# Patient Record
Sex: Male | Born: 1935 | Race: White | Hispanic: No | Smoking: Former smoker
Health system: Southern US, Community
[De-identification: ages and names within clinical notes are randomized; demographics above are authoritative.]

## PROBLEM LIST (undated history)

## (undated) DIAGNOSIS — N183 Chronic kidney disease, stage 3 unspecified: Secondary | ICD-10-CM

## (undated) DIAGNOSIS — L570 Actinic keratosis: Secondary | ICD-10-CM

## (undated) DIAGNOSIS — J209 Acute bronchitis, unspecified: Secondary | ICD-10-CM

## (undated) DIAGNOSIS — I639 Cerebral infarction, unspecified: Secondary | ICD-10-CM

## (undated) DIAGNOSIS — C61 Malignant neoplasm of prostate: Secondary | ICD-10-CM

## (undated) DIAGNOSIS — M545 Low back pain, unspecified: Secondary | ICD-10-CM

## (undated) DIAGNOSIS — J449 Chronic obstructive pulmonary disease, unspecified: Secondary | ICD-10-CM

## (undated) DIAGNOSIS — I10 Essential (primary) hypertension: Secondary | ICD-10-CM

## (undated) DIAGNOSIS — I739 Peripheral vascular disease, unspecified: Secondary | ICD-10-CM

## (undated) DIAGNOSIS — K219 Gastro-esophageal reflux disease without esophagitis: Secondary | ICD-10-CM

## (undated) DIAGNOSIS — Q2730 Arteriovenous malformation, site unspecified: Secondary | ICD-10-CM

## (undated) DIAGNOSIS — J309 Allergic rhinitis, unspecified: Secondary | ICD-10-CM

## (undated) DIAGNOSIS — R7309 Other abnormal glucose: Secondary | ICD-10-CM

## (undated) DIAGNOSIS — N400 Enlarged prostate without lower urinary tract symptoms: Secondary | ICD-10-CM

## (undated) DIAGNOSIS — M1990 Unspecified osteoarthritis, unspecified site: Secondary | ICD-10-CM

## (undated) DIAGNOSIS — D126 Benign neoplasm of colon, unspecified: Secondary | ICD-10-CM

## (undated) DIAGNOSIS — Z87891 Personal history of nicotine dependence: Secondary | ICD-10-CM

## (undated) DIAGNOSIS — M316 Other giant cell arteritis: Secondary | ICD-10-CM

## (undated) DIAGNOSIS — S5010XA Contusion of unspecified forearm, initial encounter: Secondary | ICD-10-CM

## (undated) DIAGNOSIS — Z8546 Personal history of malignant neoplasm of prostate: Secondary | ICD-10-CM

## (undated) DIAGNOSIS — K5900 Constipation, unspecified: Secondary | ICD-10-CM

## (undated) DIAGNOSIS — K627 Radiation proctitis: Secondary | ICD-10-CM

## (undated) DIAGNOSIS — R634 Abnormal weight loss: Secondary | ICD-10-CM

## (undated) DIAGNOSIS — G4700 Insomnia, unspecified: Secondary | ICD-10-CM

## (undated) DIAGNOSIS — M542 Cervicalgia: Secondary | ICD-10-CM

## (undated) DIAGNOSIS — K5790 Diverticulosis of intestine, part unspecified, without perforation or abscess without bleeding: Secondary | ICD-10-CM

## (undated) HISTORY — DX: Radiation proctitis: K62.7

## (undated) HISTORY — DX: Cerebral infarction, unspecified: I63.9

## (undated) HISTORY — DX: Benign neoplasm of colon, unspecified: D12.6

## (undated) HISTORY — DX: Low back pain: M54.5

## (undated) HISTORY — DX: Essential (primary) hypertension: I10

## (undated) HISTORY — DX: Malignant neoplasm of prostate: C61

## (undated) HISTORY — DX: Low back pain, unspecified: M54.50

## (undated) HISTORY — PX: INSERTION PROSTATE RADIATION SEED: SUR718

## (undated) HISTORY — DX: Gastro-esophageal reflux disease without esophagitis: K21.9

## (undated) HISTORY — DX: Unspecified osteoarthritis, unspecified site: M19.90

## (undated) HISTORY — DX: Abnormal weight loss: R63.4

## (undated) HISTORY — DX: Contusion of unspecified forearm, initial encounter: S50.10XA

## (undated) HISTORY — DX: Arteriovenous malformation, site unspecified: Q27.30

## (undated) HISTORY — DX: Personal history of malignant neoplasm of prostate: Z85.46

## (undated) HISTORY — DX: Other abnormal glucose: R73.09

## (undated) HISTORY — DX: Diverticulosis of intestine, part unspecified, without perforation or abscess without bleeding: K57.90

## (undated) HISTORY — DX: Cervicalgia: M54.2

## (undated) HISTORY — DX: Peripheral vascular disease, unspecified: I73.9

## (undated) HISTORY — DX: Other giant cell arteritis: M31.6

## (undated) HISTORY — DX: Acute bronchitis, unspecified: J20.9

## (undated) HISTORY — DX: Chronic obstructive pulmonary disease, unspecified: J44.9

## (undated) HISTORY — DX: Benign prostatic hyperplasia without lower urinary tract symptoms: N40.0

## (undated) HISTORY — DX: Insomnia, unspecified: G47.00

## (undated) HISTORY — PX: CATARACT EXTRACTION, BILATERAL: SHX1313

## (undated) HISTORY — DX: Actinic keratosis: L57.0

## (undated) HISTORY — DX: Allergic rhinitis, unspecified: J30.9

## (undated) HISTORY — DX: Constipation, unspecified: K59.00

## (undated) HISTORY — DX: Personal history of nicotine dependence: Z87.891

---

## 1997-02-06 ENCOUNTER — Encounter: Payer: Self-pay | Admitting: Internal Medicine

## 1997-12-12 ENCOUNTER — Ambulatory Visit (HOSPITAL_COMMUNITY): Admission: RE | Admit: 1997-12-12 | Discharge: 1997-12-12 | Payer: Self-pay | Admitting: Internal Medicine

## 1997-12-24 ENCOUNTER — Ambulatory Visit (HOSPITAL_COMMUNITY): Admission: RE | Admit: 1997-12-24 | Discharge: 1997-12-24 | Payer: Self-pay | Admitting: Internal Medicine

## 1998-06-01 DIAGNOSIS — Z8546 Personal history of malignant neoplasm of prostate: Secondary | ICD-10-CM

## 1998-06-01 HISTORY — DX: Personal history of malignant neoplasm of prostate: Z85.46

## 1998-08-14 ENCOUNTER — Ambulatory Visit (HOSPITAL_BASED_OUTPATIENT_CLINIC_OR_DEPARTMENT_OTHER): Admission: RE | Admit: 1998-08-14 | Discharge: 1998-08-14 | Payer: Self-pay | Admitting: Surgery

## 1999-05-05 ENCOUNTER — Other Ambulatory Visit: Admission: RE | Admit: 1999-05-05 | Discharge: 1999-05-05 | Payer: Self-pay | Admitting: Urology

## 1999-05-30 ENCOUNTER — Encounter: Admission: RE | Admit: 1999-05-30 | Discharge: 1999-08-28 | Payer: Self-pay | Admitting: Radiation Oncology

## 1999-07-29 ENCOUNTER — Ambulatory Visit (HOSPITAL_BASED_OUTPATIENT_CLINIC_OR_DEPARTMENT_OTHER): Admission: RE | Admit: 1999-07-29 | Discharge: 1999-07-29 | Payer: Self-pay | Admitting: Urology

## 1999-07-29 ENCOUNTER — Encounter: Payer: Self-pay | Admitting: Urology

## 1999-08-19 ENCOUNTER — Encounter: Payer: Self-pay | Admitting: Radiation Oncology

## 1999-09-05 ENCOUNTER — Encounter: Admission: RE | Admit: 1999-09-05 | Discharge: 1999-12-04 | Payer: Self-pay | Admitting: Radiation Oncology

## 1999-10-16 ENCOUNTER — Emergency Department (HOSPITAL_COMMUNITY): Admission: EM | Admit: 1999-10-16 | Discharge: 1999-10-16 | Payer: Self-pay | Admitting: Emergency Medicine

## 2001-08-30 ENCOUNTER — Encounter: Payer: Self-pay | Admitting: Gastroenterology

## 2001-08-30 DIAGNOSIS — D126 Benign neoplasm of colon, unspecified: Secondary | ICD-10-CM

## 2001-08-30 HISTORY — DX: Benign neoplasm of colon, unspecified: D12.6

## 2003-08-06 ENCOUNTER — Encounter: Payer: Self-pay | Admitting: Gastroenterology

## 2003-09-06 ENCOUNTER — Encounter: Payer: Self-pay | Admitting: Gastroenterology

## 2004-06-24 ENCOUNTER — Ambulatory Visit: Payer: Self-pay | Admitting: Internal Medicine

## 2004-09-22 ENCOUNTER — Ambulatory Visit: Payer: Self-pay | Admitting: Internal Medicine

## 2004-11-11 ENCOUNTER — Ambulatory Visit: Payer: Self-pay | Admitting: Internal Medicine

## 2004-11-24 ENCOUNTER — Ambulatory Visit: Payer: Self-pay | Admitting: Internal Medicine

## 2005-03-17 ENCOUNTER — Ambulatory Visit: Payer: Self-pay | Admitting: Internal Medicine

## 2005-06-30 ENCOUNTER — Ambulatory Visit: Payer: Self-pay | Admitting: Internal Medicine

## 2005-09-28 ENCOUNTER — Ambulatory Visit: Payer: Self-pay | Admitting: Internal Medicine

## 2006-01-18 ENCOUNTER — Ambulatory Visit: Payer: Self-pay | Admitting: Internal Medicine

## 2006-02-03 ENCOUNTER — Ambulatory Visit: Payer: Self-pay | Admitting: Internal Medicine

## 2006-02-08 ENCOUNTER — Ambulatory Visit: Payer: Self-pay | Admitting: *Deleted

## 2006-03-05 ENCOUNTER — Ambulatory Visit: Payer: Self-pay | Admitting: Internal Medicine

## 2006-04-19 ENCOUNTER — Ambulatory Visit: Payer: Self-pay | Admitting: Internal Medicine

## 2006-05-13 ENCOUNTER — Ambulatory Visit: Payer: Self-pay | Admitting: Internal Medicine

## 2006-07-20 ENCOUNTER — Ambulatory Visit: Payer: Self-pay | Admitting: Internal Medicine

## 2006-11-16 ENCOUNTER — Ambulatory Visit: Payer: Self-pay | Admitting: Internal Medicine

## 2006-11-16 LAB — CONVERTED CEMR LAB
AST: 23 units/L (ref 0–37)
BUN: 16 mg/dL (ref 6–23)
LDL Cholesterol: 72 mg/dL (ref 0–99)
Potassium: 4.7 meq/L (ref 3.5–5.1)
VLDL: 8 mg/dL (ref 0–40)

## 2006-11-24 ENCOUNTER — Ambulatory Visit: Payer: Self-pay | Admitting: Internal Medicine

## 2007-02-24 ENCOUNTER — Ambulatory Visit: Payer: Self-pay | Admitting: Internal Medicine

## 2007-02-25 ENCOUNTER — Ambulatory Visit: Payer: Self-pay | Admitting: Internal Medicine

## 2007-08-24 ENCOUNTER — Ambulatory Visit: Payer: Self-pay | Admitting: Internal Medicine

## 2007-08-24 DIAGNOSIS — M542 Cervicalgia: Secondary | ICD-10-CM

## 2007-08-24 DIAGNOSIS — M545 Low back pain, unspecified: Secondary | ICD-10-CM | POA: Insufficient documentation

## 2007-08-24 DIAGNOSIS — I10 Essential (primary) hypertension: Secondary | ICD-10-CM

## 2007-08-24 DIAGNOSIS — M1990 Unspecified osteoarthritis, unspecified site: Secondary | ICD-10-CM

## 2007-08-24 DIAGNOSIS — I739 Peripheral vascular disease, unspecified: Secondary | ICD-10-CM | POA: Insufficient documentation

## 2007-08-24 HISTORY — DX: Unspecified osteoarthritis, unspecified site: M19.90

## 2007-08-24 HISTORY — DX: Peripheral vascular disease, unspecified: I73.9

## 2007-08-24 HISTORY — DX: Low back pain, unspecified: M54.50

## 2007-08-24 HISTORY — DX: Essential (primary) hypertension: I10

## 2007-08-24 HISTORY — DX: Cervicalgia: M54.2

## 2007-10-14 ENCOUNTER — Encounter: Payer: Self-pay | Admitting: Internal Medicine

## 2007-11-21 ENCOUNTER — Ambulatory Visit: Payer: Self-pay | Admitting: Internal Medicine

## 2007-11-21 LAB — CONVERTED CEMR LAB
BUN: 14 mg/dL (ref 6–23)
CO2: 27 meq/L (ref 19–32)
Chloride: 105 meq/L (ref 96–112)
Creatinine, Ser: 1.1 mg/dL (ref 0.4–1.5)
Potassium: 4.4 meq/L (ref 3.5–5.1)
TSH: 1.41 microintl units/mL (ref 0.35–5.50)
Total CK: 64 units/L (ref 7–195)

## 2007-12-01 ENCOUNTER — Ambulatory Visit: Payer: Self-pay | Admitting: Internal Medicine

## 2008-03-06 ENCOUNTER — Ambulatory Visit: Payer: Self-pay | Admitting: Internal Medicine

## 2008-06-04 ENCOUNTER — Ambulatory Visit: Payer: Self-pay | Admitting: Internal Medicine

## 2008-06-04 DIAGNOSIS — L570 Actinic keratosis: Secondary | ICD-10-CM

## 2008-06-04 HISTORY — DX: Actinic keratosis: L57.0

## 2008-08-03 ENCOUNTER — Ambulatory Visit: Payer: Self-pay | Admitting: Internal Medicine

## 2008-08-03 DIAGNOSIS — J209 Acute bronchitis, unspecified: Secondary | ICD-10-CM

## 2008-08-03 DIAGNOSIS — R7309 Other abnormal glucose: Secondary | ICD-10-CM

## 2008-08-03 HISTORY — DX: Acute bronchitis, unspecified: J20.9

## 2008-08-03 HISTORY — DX: Other abnormal glucose: R73.09

## 2008-08-08 ENCOUNTER — Ambulatory Visit: Payer: Self-pay | Admitting: Internal Medicine

## 2008-08-08 DIAGNOSIS — J449 Chronic obstructive pulmonary disease, unspecified: Secondary | ICD-10-CM

## 2008-08-08 DIAGNOSIS — J4489 Other specified chronic obstructive pulmonary disease: Secondary | ICD-10-CM

## 2008-08-08 HISTORY — DX: Other specified chronic obstructive pulmonary disease: J44.89

## 2008-08-08 HISTORY — DX: Chronic obstructive pulmonary disease, unspecified: J44.9

## 2008-08-21 ENCOUNTER — Ambulatory Visit: Payer: Self-pay | Admitting: Internal Medicine

## 2008-08-29 ENCOUNTER — Telehealth: Payer: Self-pay | Admitting: Internal Medicine

## 2008-09-05 ENCOUNTER — Ambulatory Visit: Payer: Self-pay | Admitting: Internal Medicine

## 2008-09-05 DIAGNOSIS — J309 Allergic rhinitis, unspecified: Secondary | ICD-10-CM | POA: Insufficient documentation

## 2008-09-05 HISTORY — DX: Allergic rhinitis, unspecified: J30.9

## 2008-09-10 ENCOUNTER — Encounter: Payer: Self-pay | Admitting: Internal Medicine

## 2008-09-17 ENCOUNTER — Ambulatory Visit: Payer: Self-pay | Admitting: Internal Medicine

## 2008-09-17 DIAGNOSIS — S5010XA Contusion of unspecified forearm, initial encounter: Secondary | ICD-10-CM

## 2008-09-17 HISTORY — DX: Contusion of unspecified forearm, initial encounter: S50.10XA

## 2008-12-07 ENCOUNTER — Ambulatory Visit: Payer: Self-pay | Admitting: Internal Medicine

## 2008-12-07 DIAGNOSIS — K219 Gastro-esophageal reflux disease without esophagitis: Secondary | ICD-10-CM

## 2008-12-07 HISTORY — DX: Gastro-esophageal reflux disease without esophagitis: K21.9

## 2009-02-26 ENCOUNTER — Ambulatory Visit: Payer: Self-pay | Admitting: Internal Medicine

## 2009-03-04 ENCOUNTER — Ambulatory Visit: Payer: Self-pay | Admitting: Internal Medicine

## 2009-03-04 LAB — CONVERTED CEMR LAB
ALT: 26 units/L (ref 0–53)
AST: 27 units/L (ref 0–37)
Albumin: 4.1 g/dL (ref 3.5–5.2)
GFR calc non Af Amer: 76.38 mL/min (ref >60)
Glucose, Bld: 90 mg/dL (ref 70–99)
HDL: 63.6 mg/dL (ref >39.00)
Potassium: 4.4 meq/L (ref 3.5–5.1)
Sodium: 140 meq/L (ref 135–145)
TSH: 1.19 microintl units/mL (ref 0.35–5.50)
Total Bilirubin: 1 mg/dL (ref 0.3–1.2)
Total Protein: 6.2 g/dL (ref 6.0–8.3)
Triglycerides: 49 mg/dL (ref 0.0–149.0)
VLDL: 9.8 mg/dL (ref 0.0–40.0)

## 2009-03-14 ENCOUNTER — Ambulatory Visit: Payer: Self-pay | Admitting: Internal Medicine

## 2009-03-14 DIAGNOSIS — Z87891 Personal history of nicotine dependence: Secondary | ICD-10-CM

## 2009-03-14 HISTORY — DX: Personal history of nicotine dependence: Z87.891

## 2009-03-15 ENCOUNTER — Telehealth: Payer: Self-pay | Admitting: Internal Medicine

## 2009-03-19 ENCOUNTER — Telehealth: Payer: Self-pay | Admitting: Internal Medicine

## 2009-06-14 ENCOUNTER — Ambulatory Visit: Payer: Self-pay | Admitting: Internal Medicine

## 2009-06-14 DIAGNOSIS — G4700 Insomnia, unspecified: Secondary | ICD-10-CM

## 2009-06-14 HISTORY — DX: Insomnia, unspecified: G47.00

## 2009-09-12 ENCOUNTER — Ambulatory Visit: Payer: Self-pay | Admitting: Internal Medicine

## 2009-09-12 LAB — CONVERTED CEMR LAB
ALT: 33 units/L (ref 0–53)
Alkaline Phosphatase: 57 units/L (ref 39–117)
Bilirubin, Direct: 0.2 mg/dL (ref 0.0–0.3)
CO2: 28 meq/L (ref 19–32)
Chloride: 105 meq/L (ref 96–112)
HDL: 88.1 mg/dL (ref >39.00)
Potassium: 4.4 meq/L (ref 3.5–5.1)
Sodium: 140 meq/L (ref 135–145)
TSH: 1.06 microintl units/mL (ref 0.35–5.50)
Total CHOL/HDL Ratio: 2
Total Protein: 6.2 g/dL (ref 6.0–8.3)

## 2009-09-17 ENCOUNTER — Ambulatory Visit: Payer: Self-pay | Admitting: Internal Medicine

## 2009-09-17 DIAGNOSIS — R634 Abnormal weight loss: Secondary | ICD-10-CM | POA: Insufficient documentation

## 2009-09-17 DIAGNOSIS — K5909 Other constipation: Secondary | ICD-10-CM | POA: Insufficient documentation

## 2009-09-17 HISTORY — DX: Abnormal weight loss: R63.4

## 2009-09-27 ENCOUNTER — Telehealth: Payer: Self-pay | Admitting: Internal Medicine

## 2009-10-17 ENCOUNTER — Encounter: Payer: Self-pay | Admitting: Gastroenterology

## 2009-10-31 ENCOUNTER — Ambulatory Visit: Payer: Self-pay | Admitting: Gastroenterology

## 2009-10-31 DIAGNOSIS — Z860100 Personal history of colon polyps, unspecified: Secondary | ICD-10-CM | POA: Insufficient documentation

## 2009-10-31 DIAGNOSIS — K5900 Constipation, unspecified: Secondary | ICD-10-CM

## 2009-10-31 HISTORY — DX: Constipation, unspecified: K59.00

## 2009-11-19 ENCOUNTER — Ambulatory Visit: Payer: Self-pay | Admitting: Gastroenterology

## 2009-11-19 LAB — HM COLONOSCOPY

## 2009-11-20 ENCOUNTER — Encounter: Payer: Self-pay | Admitting: Gastroenterology

## 2009-11-25 ENCOUNTER — Telehealth: Payer: Self-pay | Admitting: Gastroenterology

## 2009-11-29 ENCOUNTER — Telehealth: Payer: Self-pay | Admitting: Gastroenterology

## 2009-12-17 ENCOUNTER — Ambulatory Visit: Payer: Self-pay | Admitting: Internal Medicine

## 2010-02-02 ENCOUNTER — Emergency Department (HOSPITAL_COMMUNITY)
Admission: EM | Admit: 2010-02-02 | Discharge: 2010-02-02 | Payer: Self-pay | Source: Home / Self Care | Admitting: Emergency Medicine

## 2010-03-25 ENCOUNTER — Ambulatory Visit: Payer: Self-pay | Admitting: Internal Medicine

## 2010-03-25 DIAGNOSIS — F4321 Adjustment disorder with depressed mood: Secondary | ICD-10-CM | POA: Insufficient documentation

## 2010-03-26 LAB — CONVERTED CEMR LAB
AST: 20 units/L (ref 0–37)
Chloride: 104 meq/L (ref 96–112)
Creatinine, Ser: 1.1 mg/dL (ref 0.4–1.5)
Potassium: 4.5 meq/L (ref 3.5–5.1)
TSH: 0.64 microintl units/mL (ref 0.35–5.50)
Total Bilirubin: 0.6 mg/dL (ref 0.3–1.2)

## 2010-04-22 ENCOUNTER — Ambulatory Visit: Payer: Self-pay | Admitting: Internal Medicine

## 2010-06-23 ENCOUNTER — Other Ambulatory Visit: Payer: Self-pay | Admitting: Internal Medicine

## 2010-06-23 ENCOUNTER — Ambulatory Visit
Admission: RE | Admit: 2010-06-23 | Discharge: 2010-06-23 | Payer: Self-pay | Source: Home / Self Care | Attending: Internal Medicine | Admitting: Internal Medicine

## 2010-06-23 ENCOUNTER — Encounter: Payer: Self-pay | Admitting: Internal Medicine

## 2010-06-23 DIAGNOSIS — Z8546 Personal history of malignant neoplasm of prostate: Secondary | ICD-10-CM | POA: Insufficient documentation

## 2010-06-23 LAB — CBC WITH DIFFERENTIAL/PLATELET
Basophils Absolute: 0.1 10*3/uL (ref 0.0–0.1)
Basophils Relative: 0.8 % (ref 0.0–3.0)
Eosinophils Absolute: 0.5 10*3/uL (ref 0.0–0.7)
Lymphocytes Relative: 26.4 % (ref 12.0–46.0)
MCHC: 35.5 g/dL (ref 30.0–36.0)
Neutrophils Relative %: 59.9 % (ref 43.0–77.0)
RBC: 3.35 Mil/uL — ABNORMAL LOW (ref 4.22–5.81)

## 2010-06-23 LAB — PSA: PSA: 0.01 ng/mL — ABNORMAL LOW (ref 0.10–4.00)

## 2010-06-23 LAB — LIPID PANEL
HDL: 95 mg/dL (ref >39.00)
LDL Cholesterol: 85 mg/dL (ref 0–99)
Total CHOL/HDL Ratio: 2
VLDL: 8 mg/dL (ref 0.0–40.0)

## 2010-06-23 LAB — BASIC METABOLIC PANEL
CO2: 26 mEq/L (ref 19–32)
Calcium: 9 mg/dL (ref 8.4–10.5)
Creatinine, Ser: 0.9 mg/dL (ref 0.4–1.5)

## 2010-06-23 LAB — URINALYSIS
Bilirubin Urine: NEGATIVE
Hemoglobin, Urine: NEGATIVE
Ketones, ur: NEGATIVE
Urine Glucose: NEGATIVE
Urobilinogen, UA: 0.2 (ref 0.0–1.0)

## 2010-06-23 LAB — HEPATIC FUNCTION PANEL
Alkaline Phosphatase: 68 U/L (ref 39–117)
Bilirubin, Direct: 0.2 mg/dL (ref 0.0–0.3)
Total Protein: 6.2 g/dL (ref 6.0–8.3)

## 2010-07-01 NOTE — Procedures (Signed)
Summary: Colonoscopy  Patient: Raymond Medina Note: All result statuses are Final unless otherwise noted.  Tests: (1) Colonoscopy (COL)   COL Colonoscopy           DONE     [facility]     [address]     [place], [place]  [address]           COLONOSCOPY PROCEDURE REPORT           PATIENT:  Raymond Medina, Raymond Medina  MR#:  [id number]     BIRTHDATE:  03/02/1927, 82 yrs. old  GENDER:  male     ENDOSCOPIST:  Judie Petit T. Russella Dar, MD, [facility]           PROCEDURE DATE:  11/19/2009     PROCEDURE:  Colonoscopy with snare polypectomy, and with     submucosal injection     ASA CLASS:  Class II     INDICATIONS:  1) follow-up of polyp  2) surveillance and high-risk     screening, tubulovillous adenoma, 08/2001.     MEDICATIONS:   Fentanyl 75 mcg IV, Versed 8 mg IV     DESCRIPTION OF PROCEDURE:   After the risks benefits and     alternatives of the procedure were thoroughly explained, informed     consent was obtained.  Digital rectal exam was performed and     revealed no abnormalities.   The LB [id number] endoscope     was introduced through the anus and advanced to the cecum, which     was identified by both the appendix and ileocecal valve, without     limitations.  The quality of the prep was good, using MoviPrep.     The instrument was then slowly withdrawn as the colon was fully     examined.     <<PROCEDUREIMAGES>>     FINDINGS:  A sessile polyp was found in the distal transverse     colon. It was 17 mm in size. Polyp was snared, then cauterized     with monopolar cautery. Retrieval was successful. Piecemeal     polypectomy. Submucosal injection [place] ink, tattoo at 3 sites     adjancent to the polyp site. Mild diverticulosis was found in the     descending colon. A normal appearing cecum, ileocecal valve, and     appendiceal orifice were identified. The ascending, hepatic     flexure, splenic flexure, sigmoid colon, and rectum appeared     unremarkable. Retroflexed views in the rectum  revealed internal     hemorrhoids, small and radiation proctitis, mild.  The time to     cecum =  5  minutes. The scope was then withdrawn (time =  14.5     min) from the patient and the procedure completed.           COMPLICATIONS:  None           ENDOSCOPIC IMPRESSION:     1) 17 mm sessile polyp in the distal transverse colon     2) Small internal hemorrhoids     3) Mild diverticulosis in the descending colon     4) Mild radiation proctitis           RECOMMENDATIONS:     1) No aspirin or NSAID's for 2 weeks     2) High fiber diet with liberal fluid intake.     3) Repeat Colonoscopy in 1 year pending pathology review  Venita Lick. Russella Dar, MD, [facility]           CC: Linda Hedges. Plotnikov, MD           n.     Rosalie DoctorJudie Petit T. Petr Bontempo at 11/19/2009 11:55 AM           Radene Knee, [id number]  Note: An exclamation mark (!) indicates a result that was not dispersed into the flowsheet. Document Creation Date: 11/19/2009 11:56 AM _______________________________________________________________________  (1) Order result status: Final Collection or observation date-time: 11/19/2009 11:41 Requested date-time:  Receipt date-time:  Reported date-time:  Referring Physician:   Ordering Physician: Claudette Head [id number]) Specimen Source:  Source: Launa Grill Order Number: [id number] Lab site:   Appended Document: Colonoscopy     Procedures Next Due Date:    Colonoscopy: 10/2010

## 2010-07-01 NOTE — Assessment & Plan Note (Signed)
Summary: 3 mos f/u / # / cd   Vital Signs:  Patient profile:   77 year old male Height:      67 inches (170.18 cm) Weight:      139 pounds (63.18 kg) BMI:     21.85 O2 Sat:      98 % on Room air Temp:     98.2 degrees F (36.78 degrees C) oral Pulse rate:   70 / minute Pulse rhythm:   regular Resp:     16 per minute BP sitting:   160 / 72  (left arm) Cuff size:   regular  Vitals Entered By: Lanier Prude, CMA(AAMA) (December 17, 2009 11:12 AM)  O2 Flow:  Room air CC: 3 mo f/u. c/o constipation Is Patient Diabetic? No   Primary Care Provider:  Jacinta Shoe, MD  CC:  3 mo f/u. c/o constipation.  History of Present Illness: The patient presents for a follow up of hypertension, PVD, h/o TIA C/o constipation   Current Medications (verified): 1)  Hydrocortisone 2.5 %  Lotn (Hydrocortisone) .... Apply Two Times A Day X 4-6 Weeks 2)  Fish Oil 1200 Mg Caps (Omega-3 Fatty Acids) .... Take One By Mouth Once Daily 3)  Baby Aspirin 81 Mg  Chew (Aspirin) .... Once Daily 4)  Vitamin D3 1000 Unit  Tabs (Cholecalciferol) .Marland Kitchen.. 1 By Mouth Daily 5)  Losartan Potassium 100 Mg Tabs (Losartan Potassium) .Marland Kitchen.. 1 By Mouth Once Daily For Blood Pressure  Allergies (verified): 1)  ! Hydrocodone-Acetaminophen (Hydrocodone-Acetaminophen) 2)  Symbicort (Budesonide-Formoterol Fumarate) 3)  Ranitidine Hcl (Ranitidine Hcl)  Past History:  Past Medical History: Last updated: 10/30/2009 Hypertension Low back pain Osteoarthritis Peripheral vascular disease Gout H/o Giant Cell arteritis ? 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 EOMI. Perrla. Mouth:  Oral mucosa and oropharynx without lesions or exudates.  Teeth in good repair. Neck:  No deformities, masses, or tenderness noted. R carotid bruit and L carotid bruit.   Lungs:  Lungs are clear to auscultation, no crackles or wheezes. Heart:  Normal rate and regular rhythm. S1 and S2 normal without gallop, murmur, click, rub or other extra sounds. Abdomen:  Bowel sounds positive,abdomen soft and non-tender without masses, organomegaly or hernias noted. Msk:  WNL for age Neurologic:  No cranial nerve deficits noted. Station and gait are normal.   Skin:  AKs on face, scalp, forearms and chest Come SD is present as well Psych:  Oriented X3 and not depressed appearing.     Impression & Recommendations:  Problem # 1:  CONSTIPATION, CHRONIC (ICD-564.09) Assessment Deteriorated  His updated medication list for this problem includes:    Miralax Powd (Polyethylene glycol 3350) .Marland Kitchen... 1/2 or 1 scoop once daily as needed for constipation  Problem # 2:  ACTINIC KERATOSIS (ICD-702.0) Assessment: New  Orders: Cryotherapy/Destruction benign or premalignant lesion (1st lesion)  (17000) Cryotherapy/Destruction benign or premalignant lesion (2nd-14th lesions) (17003) Procedure: cryo Indication: AK(s) Risks incl. scar(s), incomplete removal, ect.  and benefits discussed     14 lesion(s)  total on face, L ear, L forearm and chest was/were treated with liqid N2 in usual fasion.  Tolerated well. Compl. none. Wound care instructions given.   Problem # 3:  HYPERTENSION (ICD-401.9)  His updated medication list for this problem includes:    Losartan Potassium 100 Mg Tabs (Losartan potassium) .Marland Kitchen... 1 by mouth once daily for blood  pressure  Problem # 4:  LOW BACK PAIN (ICD-724.2) Assessment: Improved  His updated medication list for this problem includes:    Baby Aspirin 81 Mg Chew (Aspirin) ..... Once daily  Complete Medication List: 1)  Hydrocortisone 2.5 % Lotn (Hydrocortisone) .... Apply two times a day x 4-6 weeks 2)  Fish Oil 1200 Mg Caps (Omega-3 fatty acids) .... Take one by mouth once daily 3)  Baby Aspirin 81 Mg Chew (Aspirin) .... Once daily 4)  Vitamin D3 1000 Unit Tabs (Cholecalciferol) .Marland Kitchen.. 1 by mouth daily 5)  Losartan Potassium 100 Mg Tabs (Losartan potassium) .Marland Kitchen.. 1 by mouth once daily for blood pressure 6)  Miralax Powd (Polyethylene glycol 3350) .... 1/2 or 1 scoop once daily as needed for constipation 7)  Triamcinolone Acetonide 0.5 % Crea (Triamcinolone acetonide) .... Use two times a day prn  Patient Instructions: 1)  Please schedule a follow-up appointment in 3 months. 2)  Prunes or prune juce Prescriptions: TRIAMCINOLONE ACETONIDE 0.5 % CREA (TRIAMCINOLONE ACETONIDE) use two times a day prn  #120 g x 3   Entered and Authorized by:   Tresa Garter MD   Signed by:   Tresa Garter MD on 12/17/2009   Method used:   Print then Give to Patient   RxID:   [id number] MIRALAX  POWD (POLYETHYLENE GLYCOL 3350) 1/2 or 1 scoop once daily as needed for constipation  #1 x 12   Entered and Authorized by:   Tresa Garter MD   Signed by:   Tresa Garter MD on 12/17/2009   Method used:   Print then Give to Patient   RxID:   [id number]

## 2010-07-01 NOTE — Letter (Signed)
Summary: Patient Notice- Polyp Results  [facility]  [address] [place], [place] [address]   Phone: [phone]  Fax: [phone]        November 20, 2009 MRN: [id number]    Raymond Medina [address] [place], [place]  [address]    Dear Raymond Medina,  I am pleased to inform you that the colon polyp(s) removed during your recent colonoscopy was (were) found to be benign (no cancer detected) upon pathologic examination.  I recommend you have a repeat colonoscopy examination in 1 year to look for recurrent polyps and to assess if the polyp was completely removed, as having colon polyps increases your risk for having recurrent polyps or even colon cancer in the future.  Should you develop new or worsening symptoms of abdominal pain, bowel habit changes or bleeding from the rectum or bowels, please schedule an evaluation with either your primary care physician or with me.  Continue treatment plan as outlined the day of your exam.  Please call us if you are having persistent problems or have questions about your condition that have not been fully answered at this time.  Sincerely,  Meryl Dare MD [facility]  This letter has been electronically signed by your physician.  Appended Document: Patient Notice- Polyp Results letter mailed.

## 2010-07-01 NOTE — Procedures (Signed)
Summary: Flexible Sigmoidoscopy/[facility]  Flexible Sigmoidoscopy/[facility]   Imported By: Sherian Rein 10/31/2009 15:57:09  _____________________________________________________________________  External Attachment:    Type:   Image     Comment:   External Document

## 2010-07-01 NOTE — Assessment & Plan Note (Signed)
Summary: 3 MO ROV /NWS  #   Vital Signs:  Patient profile:   77 year old male Height:      67 inches Weight:      142 pounds BMI:     22.32 O2 Sat:      98 % on Room air Temp:     97.7 degrees F oral Pulse rate:   69 / minute BP sitting:   134 / 72  (left arm) Cuff size:   regular  Vitals Entered By: Lucious Groves (September 17, 2009 11:01 AM)  O2 Flow:  Room air CC: 3 mo rtn ov./kb Is Patient Diabetic? No Pain Assessment Patient in pain? no        CC:  3 mo rtn ov./kb.  History of Present Illness: C/o constipation x 3-6 wks; stool of nl size, no blood; no abd pain... The patient presents for a follow up of hypertension, gout, OA   Current Medications (verified): 1)  Hydrocortisone 2.5 %  Lotn (Hydrocortisone) .... Apply Two Times A Day X 4-6 Weeks 2)  Fish Oil   Oil (Fish Oil) .... Once Daily 3)  Baby Aspirin 81 Mg  Chew (Aspirin) .... Once Daily 4)  Vitamin D3 1000 Unit  Tabs (Cholecalciferol) .Marland Kitchen.. 1 By Mouth Daily 5)  Hydroxyzine Hcl 25 Mg Tabs (Hydroxyzine Hcl) .Marland Kitchen.. 1-2 Tabs By Mouth At [facility] As Needed Insomnia 6)  Losartan Potassium 100 Mg Tabs (Losartan Potassium) .Marland Kitchen.. 1 By Mouth Once Daily For Blood Pressure  Allergies (verified): 1)  ! 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  The patient denies chest pain, prolonged cough, melena, and hematochezia.    Physical Exam  General:  Well-developed,well-nourished,in no acute distress; alert,appropriate and cooperative throughout examination Nose:  External  nasal examination shows no deformity or inflammation. Nasal mucosa are pink and moist without lesions or exudates. Mouth:  Oral mucosa and oropharynx without lesions or exudates.  Teeth in good repair. Lungs:  Lungs are clear to auscultation, no crackles or wheezes. Heart:  Normal rate and regular rhythm. S1 and S2 normal without gallop, murmur, click, rub or other extra sounds. Abdomen:  Bowel sounds positive,abdomen soft and non-tender without masses, organomegaly or hernias noted. Rectal:  not done, def to GI Msk:  WNL for age Extremities:  no edema Neurologic:  No cranial nerve deficits noted. Station and gait are normal.   Skin:  clear Psych:  Oriented X3 and not depressed appearing.     Impression & Recommendations:  Problem # 1:  CONSTIPATION, CHRONIC (ICD-564.09) Assessment New  His updated medication list for this problem includes:    Miralax Powd (Polyethylene glycol 3350) .Marland Kitchen... 1 scoop once daily as needed for constipation  Orders: Gastroenterology Referral (GI) Dr Russella Dar  Problem # 2:  WEIGHT LOSS (GEX-528.41) Assessment: New  Orders: Gastroenterology Referral (GI)  Problem # 3:  GERD (ICD-530.81) Assessment: Improved  Problem # 4:  HYPERTENSION (ICD-401.9)  His updated medication list for this problem includes:    Losartan Potassium 100 Mg Tabs (Losartan potassium) .Marland Kitchen... 1 by mouth once daily for blood pressure The labs were reviewed with the  patient.   Problem # 5:  LOW BACK PAIN (ICD-724.2) Assessment: Deteriorated  His updated medication list for this problem includes:    Baby Aspirin 81 Mg Chew (Aspirin) ..... Once daily  Complete Medication List: 1)  Hydrocortisone 2.5 % Lotn (Hydrocortisone) .... Apply two times a day x 4-6 weeks 2)  Fish Oil Oil (Fish oil) .... Once daily 3)  Baby Aspirin 81 Mg Chew (Aspirin) .... Once daily 4)  Vitamin D3 1000 Unit Tabs (Cholecalciferol) .Marland Kitchen.. 1 by mouth daily 5)  Losartan Potassium 100 Mg Tabs (Losartan  potassium) .Marland Kitchen.. 1 by mouth once daily for blood pressure 6)  Zolpidem Tartrate 10 Mg Tabs (Zolpidem tartrate) .... 1/2-1 tab at bedtime as needed insomnia 7)  Miralax Powd (Polyethylene glycol 3350) .Marland Kitchen.. 1 scoop once daily as needed for constipation  Patient Instructions: 1)  Please schedule a follow-up appointment in 3 months. 2)  Call if you are not better in a reasonable amount of time or if worse.  Prescriptions: MIRALAX  POWD (POLYETHYLENE GLYCOL 3350) 1 scoop once daily as needed for constipation  #1 x 12   Entered and Authorized by:   Tresa Garter MD   Signed by:   Tresa Garter MD on 09/17/2009   Method used:   Electronically to        [facility]  [address] [id number] (retail)       [address]       [place], [place]  [address]       Ph: [phone] or [phone]       Fax: [phone]   RxID:   [id number] MIRALAX  POWD (POLYETHYLENE GLYCOL 3350) 1 scoop once daily as needed for constipation  #1 x 12   Entered and Authorized by:   Tresa Garter MD   Signed by:   Tresa Garter MD on 09/17/2009   Method used:   Print then Give to Patient   RxID:   [id number] ZOLPIDEM TARTRATE 10 MG TABS (ZOLPIDEM TARTRATE) 1/2-1 tab at bedtime as needed insomnia  #30 x 6   Entered and Authorized by:   Tresa Garter MD   Signed by:   Tresa Garter MD on 09/17/2009   Method used:   Print then Give to Patient   RxID:   [id number]

## 2010-07-01 NOTE — Assessment & Plan Note (Signed)
Summary: 3 MO ROV /NWS   Vital Signs:  Patient profile:   77 year old male Height:      67 inches Weight:      138 pounds O2 Sat:      96 % Temp:     97.7 degrees F oral Pulse rate:   63 / minute BP sitting:   160 / 78  (left arm) Cuff size:   regular  Vitals Entered By: Jarome Lamas (March 25, 2010 10:19 AM) CC: 4 month fl/up /pb Comments pt never had the prescription Triamcinolone filled.   CC:  4 month fl/up /pb.  Allergies: 1)  ! Hydrocodone-Acetaminophen (Hydrocodone-Acetaminophen) 2)  Symbicort (Budesonide-Formoterol Fumarate) 3)  Ranitidine Hcl (Ranitidine Hcl)  Past History:  Past Medical History: Last updated: 10/30/2009 Hypertension Low back pain Osteoarthritis Peripheral vascular disease Gout H/o Giant Cell arteritis ? 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 Hemorrhoids Radiation proctitis Cecal arteriovenous malformation Prostate Cancer BPH Diverticulosis  Social History: Retired Former Smoker Alcohol Use - yes 4 per day Daily Caffeine Use 4 per day Illicit Drug Use - no Widow/Widower 2011   Impression & Recommendations:  Problem # 1:  WEIGHT LOSS (ICD-783.21) - grieving Assessment Unchanged  Orders: TLB-BMP (Basic Metabolic Panel-BMET) (80048-METABOL) TLB-TSH (Thyroid Stimulating Hormone) (84443-TSH) TLB-Hepatic/Liver Function Pnl (80076-HEPATIC)  Problem # 2:  GRIEF REACTION (ICD-309.0) Assessment: New Discussed  Problem # 3:  INSOMNIA, CHRONIC (ICD-307.42) Assessment: Deteriorated  Orders: TLB-BMP (Basic Metabolic Panel-BMET) (80048-METABOL) TLB-TSH (Thyroid Stimulating Hormone) (84443-TSH) TLB-Hepatic/Liver Function Pnl (80076-HEPATIC)  Problem # 4:  HYPERTENSION (ICD-401.9) Assessment: Comment Only  The following medications were removed from the medication list:    Losartan Potassium 100 Mg Tabs (Losartan potassium) .Marland Kitchen... 1 by mouth once daily for blood pressure    Diovan 320 Mg Tabs (Valsartan) .Marland Kitchen... 1 by mouth once daily for blood pressure His updated medication list for this problem includes:    Exforge 5-160 Mg Tabs (Amlodipine besylate-valsartan) .Marland Kitchen... 1 by mouth qd  Orders: TLB-BMP (Basic Metabolic Panel-BMET) (80048-METABOL) TLB-TSH (Thyroid Stimulating Hormone) (84443-TSH) TLB-Hepatic/Liver Function Pnl (80076-HEPATIC)  Complete Medication List: 1)  Hydrocortisone 2.5 % Lotn (Hydrocortisone) .... Apply two times a day x 4-6 weeks 2)  Fish Oil 1200 Mg Caps (Omega-3 fatty acids) .... Take one by mouth once daily 3)  Baby Aspirin 81 Mg Chew (Aspirin) .... Once daily 4)  Vitamin D3 1000 Unit Tabs (Cholecalciferol) .Marland Kitchen.. 1 by mouth daily 5)  Miralax Powd (Polyethylene glycol 3350) .... 1/2 or 1 scoop once daily as needed for constipation 6)  Triamcinolone Acetonide 0.5 % Crea (Triamcinolone acetonide) .... Use two times a day prn 7)  Exforge 5-160 Mg Tabs (Amlodipine besylate-valsartan) .Marland Kitchen.. 1 by mouth qd  Patient Instructions: 1)  Please schedule a follow-up appointment in 1 month. Prescriptions: EXFORGE 5-160 MG TABS (AMLODIPINE BESYLATE-VALSARTAN) 1 by mouth qd  #30 x 11   Entered and Authorized by:   Tresa Garter MD   Signed by:   Tresa Garter MD on 03/25/2010   Method used:   Print then Give to Patient   RxID:   [id number] DIOVAN 320 MG TABS (VALSARTAN) 1 by mouth once daily for blood pressure  #90 x 3   Entered and Authorized by:   Tresa Garter MD   Signed by:   Tresa Garter MD on 03/25/2010   Method used:   Print then Give to Patient   RxID:   [id number]    Orders  Added: 1)  TLB-BMP (Basic Metabolic Panel-BMET) [80048-METABOL] 2)  TLB-TSH (Thyroid Stimulating Hormone) [84443-TSH] 3)  TLB-Hepatic/Liver Function Pnl [80076-HEPATIC] 4)  Est. Patient Level IV [98119]    Prevention & Chronic Care Immunizations   Influenza vaccine: Fluvax 3+  (02/26/2009)    Tetanus booster: 09/17/2008:  Td    Pneumococcal vaccine: Pneumovax  (06/14/2009)    H. zoster vaccine: Not documented  Colorectal Screening   Hemoccult: Not documented    Colonoscopy: DONE  (11/19/2009)   Colonoscopy due: 10/2010  Other Screening   PSA: Not documented   Smoking status: quit  (06/14/2009)  Lipids   Total Cholesterol: 170  (09/12/2009)   LDL: 74  (09/12/2009)   LDL Direct: Not documented   HDL: 88.10  (09/12/2009)   Triglycerides: 42.0  (09/12/2009)  Hypertension   Last Blood Pressure: 160 / 78  (03/25/2010)   Serum creatinine: 1.0  (09/12/2009)   Serum potassium 4.4  (09/12/2009)  Self-Management Support :    Hypertension self-management support: Not documented

## 2010-07-01 NOTE — Assessment & Plan Note (Signed)
Summary: constipation...em   History of Present Illness Visit Type: Initial Consult Primary GI MD: Elie Goody MD [facility] Primary Provider: Jacinta Shoe, MD Requesting Provider: Jacinta Shoe, MD Chief Complaint: Patient complains of constipation for the past several month. His last BM was on 10/29/2009 he can not have a BM without straining to have a BM. He denies any abdominal pain or blood in his stool. He was taking Miralax everyday but he started having diarrhea so Dr. Posey Rea advised taking it every other day and his BM were back to  "normal" so he stopped and now he is constipated again.  History of Present Illness:   This is an 77 year old white male relates difficulty with constipation, hard stools and straining for several months. He initially was taking MiraLax every day. The stools became soft and loose so we changed to MiraLax every other day and had fairly regular bowel movements with this regimen. He stopped MiraLax and has constipation returned. He tries to eat a high fiber diet and drink adequate amounts of water.  He has a history of tubulovillous adenomatous colon polyps, initially diagnosed in 2003, and did not return for recommend colonoscopy in 2008 for surveillance.   GI Review of Systems   Weight loss of 12 pounds over 6 months.   Denies abdominal pain, acid reflux, belching, bloating, chest pain, dysphagia with liquids, dysphagia with solids, heartburn, loss of appetite, nausea, vomiting, vomiting blood, weight loss, and  weight gain.      Reports constipation.     Denies anal fissure, black tarry stools, change in bowel habit, diarrhea, diverticulosis, fecal incontinence, heme positive stool, hemorrhoids, irritable bowel syndrome, jaundice, light color stool, liver problems, rectal bleeding, and  rectal pain. Preventive Screening-Counseling & Management      Drug Use:  no.     Current Medications (verified): 1)  Hydrocortisone 2.5 %  Lotn  (Hydrocortisone) .... Apply Two Times A Day X 4-6 Weeks 2)  Fish Oil 1200 Mg Caps (Omega-3 Fatty Acids) .... Take One By Mouth Once Daily 3)  Baby Aspirin 81 Mg  Chew (Aspirin) .... Once Daily 4)  Vitamin D3 1000 Unit  Tabs (Cholecalciferol) .Marland Kitchen.. 1 By Mouth Daily 5)  Losartan Potassium 100 Mg Tabs (Losartan Potassium) .Marland Kitchen.. 1 By Mouth Once Daily For Blood Pressure  Allergies (verified): 1)  ! Hydrocodone-Acetaminophen (Hydrocodone-Acetaminophen) 2)  Symbicort (Budesonide-Formoterol Fumarate) 3)  Ranitidine Hcl (Ranitidine Hcl)  Past History:  Past Medical History: Reviewed history from 10/30/2009 and no changes required. Hypertension Low back pain Osteoarthritis Peripheral vascular disease Gout H/o Giant Cell arteritis ? 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 Hemorrhoids Radiation proctitis Cecal arteriovenous malformation Prostate Cancer BPH Diverticulosis  Past Surgical History: Radiation seed implantation 2000 Cataract Extraction bilateral  Family History: Family History Hypertension Family History of Stomach Cancer: mother No FH of Colon Cancer: Family History of Diabetes: nephew  Social History: Retired Married Former Smoker Alcohol Use - yes 4 per day Daily Caffeine Use 4 per day Illicit Drug Use - no Drug Use:  no  Review of Systems       The patient complains of anxiety-new, change in vision, muscle pains/cramps, sleeping problems, urination - excessive, and urination changes/pain.         The pertinent positives and negatives are noted as above and in the HPI. All other ROS were reviewed and were negative.   Vital Signs:  Patient profile:   77 year old male Height:  67 inches Weight:      141.4 pounds BMI:     22.23 Pulse rate:   70 / minute Pulse rhythm:   regular BP sitting:   180 / 72  (right arm) Cuff size:   regular  Vitals Entered By: Harlow Mares CMA Duncan Dull) (October 31, 2009 10:52  AM)  Physical Exam  General:  Well developed, well nourished, no acute distress. Head:  Normocephalic and atraumatic. Eyes:  PERRLA, no icterus. Ears:  Normal auditory acuity. Mouth:  No deformity or lesions, dentition normal. Neck:  Supple; no masses or thyromegaly. Lungs:  Clear throughout to auscultation. Heart:  Regular rate and rhythm; no murmurs, rubs,  or bruits. Abdomen:  Soft, nontender and nondistended. No masses, hepatosplenomegaly or hernias noted. Normal bowel sounds. Rectal:  deferred until time of colonoscopy.  Recent DRE by Dr. Darvin Neighbours was unremarkable. Extremities:  No clubbing, cyanosis, edema or deformities noted. Neurologic:  Alert and  oriented x4;  grossly normal neurologically. Psych:  Alert and cooperative. Normal mood and affect.  Impression & Recommendations:  Problem # 1:  CONSTIPATION (ICD-564.00) Increase dietary fiber and water intake. Resume MiraLax every other day. Rule out partially obstructing lesions, although I suspect this is functional constipation. The risks, benefits and alternatives to colonoscopy with possible biopsy and possible polypectomy were discussed with the patient and they consent to proceed. The procedure will be scheduled electively. Orders: Colonoscopy (Colon)  Problem # 2:  PERSONAL HISTORY OF COLONIC POLYPS (ICD-V12.72) Personal history of a tubulovillous adenoma in April 2003. Overdue for surveillance colonoscopy. Plan as in problem #1. Orders: Colonoscopy (Colon)  Problem # 3:  WEIGHT LOSS (ICD-783.21) Unexplained weight loss. Colonoscopy as above. Recent PSA normal.  Patient Instructions: 1)  Colonoscopy brochure given.  2)  High Fiber, Low Fat  Healthy Eating Plan brochure given.  3)  Restart Miralax every other day. 4)  Copy sent to : Jacinta Shoe, MD 5)  The medication list was reviewed and reconciled.  All changed / newly prescribed medications were explained.  A complete medication list was provided to the  patient / caregiver.  Prescriptions: MOVIPREP 100 GM  SOLR (PEG-KCL-NACL-NASULF-NA ASC-C) As per prep instructions.  #1 x 0   Entered by:   Christie Nottingham CMA (AAMA)   Authorized by:   Meryl Dare MD [facility]   Signed by:   Meryl Dare MD FACG on 10/31/2009   Method used:   [facility] to        [facility]  [address] [id number]       [address]       [place], [place]  [address]       Ph: [phone] or [phone]       Fax: [phone]   RxID:   [id number]

## 2010-07-01 NOTE — Letter (Signed)
Summary: [facility] Instructions  [facility]  [address] [place], [place] [address]   Phone: [phone]  Fax: [phone]       Raymond Medina    [birth year]    MRN: [id number]        Procedure Day /Date: Tuesday June 21st, 2011     Arrival Time: 10:30am     Procedure Time: 11:30am     Location of Procedure:                    _ x_  [facility] (4th Floor)                        PREPARATION FOR COLONOSCOPY WITH MOVIPREP   Starting 5 days prior to your procedure 11/15/09 do not eat nuts, seeds, popcorn, corn, beans, peas,  salads, or any raw vegetables.  Do not take any fiber supplements (e.g. Metamucil, Citrucel, and Benefiber).  THE DAY BEFORE YOUR PROCEDURE         DATE: 11/18/09  DAY: Monday  1.  Drink clear liquids the entire day-NO SOLID FOOD  2.  Do not drink anything colored red or purple.  Avoid juices with pulp.  No orange juice.  3.  Drink at least 64 oz. (8 glasses) of fluid/clear liquids during the day to prevent dehydration and help the prep work efficiently.  CLEAR LIQUIDS INCLUDE: Water Jello Ice Popsicles Tea (sugar ok, no milk/cream) Powdered fruit flavored drinks Coffee (sugar ok, no milk/cream) Gatorade Juice: apple, white grape, white cranberry  Lemonade Clear bullion, consomm, broth Carbonated beverages (any kind) Strained chicken noodle soup Hard Candy                             4.  In the morning, mix first dose of MoviPrep solution:    Empty 1 Pouch A and 1 Pouch B into the disposable container    Add lukewarm drinking water to the top line of the container. Mix to dissolve    Refrigerate (mixed solution should be used within 24 hrs)  5.  Begin drinking the prep at 5:00 p.m. The MoviPrep container is divided by 4 marks.   Every 15 minutes drink the solution down to the next mark (approximately 8 oz) until the full liter is complete.   6.  Follow completed prep with 16 oz of clear liquid of your choice  (Nothing red or purple).  Continue to drink clear liquids until bedtime.  7.  Before going to bed, mix second dose of MoviPrep solution:    Empty 1 Pouch A and 1 Pouch B into the disposable container    Add lukewarm drinking water to the top line of the container. Mix to dissolve    Refrigerate  THE DAY OF YOUR PROCEDURE      DATE: 11/19/09 DAY: Tuesday  Beginning at 6:30 a.m. (5 hours before procedure):         1. Every 15 minutes, drink the solution down to the next mark (approx 8 oz) until the full liter is complete.  2. Follow completed prep with 16 oz. of clear liquid of your choice.    3. You may drink clear liquids until 9:30am (2 HOURS BEFORE PROCEDURE).   MEDICATION INSTRUCTIONS  Unless otherwise instructed, you should take regular prescription medications with a small sip of water   as early as possible the morning of your  procedure.        OTHER INSTRUCTIONS  You will need a responsible adult at least 75 years of age to accompany you and drive you home.   This person must remain in the waiting room during your procedure.  Wear loose fitting clothing that is easily removed.  Leave jewelry and other valuables at home.  However, you may wish to bring a book to read or  an iPod/MP3 player to listen to music as you wait for your procedure to start.  Remove all body piercing jewelry and leave at home.  Total time from sign-in until discharge is approximately 2-3 hours.  You should go home directly after your procedure and rest.  You can resume normal activities the  day after your procedure.  The day of your procedure you should not:   Drive   Make legal decisions   Operate machinery   Drink alcohol   Return to work  You will receive specific instructions about eating, activities and medications before you leave.    The above instructions have been reviewed and explained to me by   Marchelle Folks.     I fully understand and can verbalize these  instructions _____________________________ Date _________

## 2010-07-01 NOTE — Progress Notes (Signed)
Summary: Miralax  Phone Note Call from Patient   Summary of Call: Pt wants to know if it is necessary to take the Miralax every day.  Initial call taken by: Lamar Sprinkles, CMA,  September 27, 2009 1:48 PM  Follow-up for Phone Call        No. Can take every other day or q 3 d Follow-up by: Tresa Garter MD,  September 27, 2009 2:24 PM  Additional Follow-up for Phone Call Additional follow up Details #1::        Pt informed  Additional Follow-up by: Lamar Sprinkles, CMA,  September 27, 2009 2:27 PM

## 2010-07-01 NOTE — Progress Notes (Signed)
Summary: BM changes   Phone Note Call from Patient Call back at Home Phone [phone]   Caller: Patient Call For: Dr. Russella Dar Reason for Call: Talk to Nurse Summary of Call: changes in BM's since colonoscopy last Tuesday Initial call taken by: Vallarie Mare,  November 25, 2009 9:20 AM  Follow-up for Phone Call        patient c/o constipation since colon.  He reports he has only had one BM since his colon last week.  He hasn't been taking his miralax.  I have asked him to start back with Miralax 1-2 times a day and titrate .  Patient  verbalized understanding.  He is asked to call back for further problems, questions or concerns. Follow-up by: Darcey Nora RN, CGRN,  November 25, 2009 9:40 AM  Additional Follow-up for Phone Call Additional follow up Details #1::        Agree Additional Follow-up by: Meryl Dare MD Clementeen Graham,  November 25, 2009 10:03 AM

## 2010-07-01 NOTE — Procedures (Signed)
Summary: Colonoscopy   Colonoscopy  Procedure date:  09/06/2003  Findings:      Results: Hemorrhoids.     Results: Diverticulosis.       Results: Colitis.       Location:  [facility].    Procedures Next Due Date:    Colonoscopy: 08/2006 Patient Name: Raymond Medina, Raymond Medina MRN:  Procedure Procedures: Colonoscopy CPT: [address].  Personnel: Endoscopist: Venita Lick. Russella Dar, MD, Clementeen Graham.  Exam Location: Exam performed in Outpatient Clinic. Outpatient  Patient Consent: Procedure, Alternatives, Risks and Benefits discussed, consent obtained, from patient. Consent was obtained by the RN.  Indications Symptoms: Hematochezia.  Surveillance of: Adenomatous Polyp(s). Initial polypectomy was performed in 2003. in Apr. Pathology of worst  polyp: tubulovillous adenoma.  History  Current Medications: Patient is not currently taking Coumadin.  Pre-Exam Physical: Performed Sep 06, 2003. Entire physical exam was normal.  Exam Exam: Extent of exam reached: Cecum, extent intended: Cecum.  The cecum was identified by appendiceal orifice and IC valve. Colon retroflexion performed. ASA Classification: II. Tolerance: excellent.  Monitoring: Pulse and BP monitoring, Oximetry used. Supplemental O2 given.  Colon Prep Used Miralax for colon prep. Prep results: good.  Sedation Meds: Patient assessed and found to be appropriate for moderate (conscious) sedation. Fentanyl 100 mcg. given IV. Versed 10 mg. given IV.  Findings NORMAL EXAM: Cecum to Descending Colon.  DIVERTICULOSIS: Sigmoid Colon. Not bleeding. ICD9: Diverticulosis: 562.10. Comments: mild.  - MUCOSAL ABNORMALITY: Rectum. Proctitis present, radiation proctitis present, Friability: mild. Activity level mild, Endoscopic Extent of Disease: Proctitis. ICD9: Colitis, Radiation: 558.2.  HEMORRHOIDS: Internal. Size: Small. Not bleeding. Not thrombosed. ICD9: Hemorrhoids, Internal: 455.0.   Assessment  Diagnoses: 558.2: Colitis,  Radiation.  455.0: Hemorrhoids, Internal.  562.10: Diverticulosis.   Events  Unplanned Interventions: No intervention was required.  Unplanned Events: There were no complications. Plans Medication Plan: Continue current medications.  Patient Education: Patient given standard instructions for: Diverticulosis. Hemorrhoids. Constipation.Radiation proctitis.  Disposition: After procedure patient sent to recovery. After recovery patient sent home.  Scheduling/Referral: Colonoscopy, to [facility] T. Russella Dar, MD, [facility], around Sep 06, 2006.    cc: Linda Hedges. Plotnikov, MD  This report was created from the original endoscopy report, which was reviewed and signed by the above listed endoscopist.

## 2010-07-01 NOTE — Assessment & Plan Note (Signed)
Summary: 3 MO ROV /NWS  #   Vital Signs:  Patient profile:   77 year old male Weight:      150 pounds BMI:     23.58 Temp:     97.1 degrees F oral Pulse rate:   66 / minute BP sitting:   166 / 76  (left arm)  Vitals Entered By: Tora Perches (June 14, 2009 10:48 AM) CC: f/u Is Patient Diabetic? No   CC:  f/u.  History of Present Illness: The patient presents for a follow up of hypertension, COPD, GERD C/o insomnia   Preventive Screening-Counseling & Management  Alcohol-Tobacco     Smoking Status: quit  Current Medications (verified): 1)  Diovan 320 Mg  Tabs (Valsartan) .... Take 1 By Mouth Qd 2)  Hydrocortisone 2.5 %  Lotn (Hydrocortisone) .... Apply Two Times A Day X 4-6 Weeks 3)  Fish Oil   Oil (Fish Oil) .... Once Daily 4)  Baby Aspirin 81 Mg  Chew (Aspirin) .... Once Daily 5)  Vitamin D3 1000 Unit  Tabs (Cholecalciferol) .Marland Kitchen.. 1 By Mouth Daily  Allergies: 1)  ! Hydrocodone-Acetaminophen (Hydrocodone-Acetaminophen) 2)  Symbicort (Budesonide-Formoterol Fumarate) 3)  Ranitidine Hcl (Ranitidine Hcl)  Past History:  Past Medical History: Last updated: 12/07/2008 Hypertension Low back pain Osteoarthritis Peripheral vascular disease Gout H/o Giant Cell arteritis ? TIA Optic nerve  neuritis COPD Allergic rhinitis GERD  Social History: Last updated: 08/24/2007 Retired Married Former Smoker  Review of Systems  The patient denies hoarseness, chest pain, syncope, and prolonged cough.    Physical Exam  General:  Well-developed,well-nourished,in no acute distress; alert,appropriate and cooperative throughout examination Mouth:  Oral mucosa and oropharynx without lesions or exudates.  Teeth in good repair. Lungs:  Lungs are clear to auscultation, no crackles or wheezes. Heart:  Normal rate and regular rhythm. S1 and S2 normal without gallop, murmur, click, rub or other extra sounds. Abdomen:  Bowel sounds positive,abdomen soft and non-tender without  masses, organomegaly or hernias noted. Msk:  WNL for age Neurologic:  No cranial nerve deficits noted. Station and gait are normal.   Psych:  Oriented X3 and not depressed appearing.     Impression & Recommendations:  Problem # 1:  HYPERTENSION (ICD-401.9) Assessment Unchanged  The following medications were removed from the medication list:    Diovan 320 Mg Tabs (Valsartan) .Marland Kitchen... Take 1 by mouth qd His updated medication list for this problem includes:    Losartan Potassium 100 Mg Tabs (Losartan potassium) .Marland Kitchen... 1 by mouth once daily for blood pressure  Problem # 2:  LOW BACK PAIN (ICD-724.2) Assessment: Improved  His updated medication list for this problem includes:    Baby Aspirin 81 Mg Chew (Aspirin) ..... Once daily  Problem # 3:  COPD (ICD-496) Assessment: Unchanged  Problem # 4:  GERD (ICD-530.81) Assessment: Improved  The following medications were removed from the medication list:    Ranitidine Hcl 150 Mg Caps (Ranitidine hcl) .Marland Kitchen... 1 po bid  Problem # 5:  INSOMNIA, CHRONIC (ICD-307.42) Assessment: New Try Hydroxyzine  Complete Medication List: 1)  Hydrocortisone 2.5 % Lotn (Hydrocortisone) .... Apply two times a day x 4-6 weeks 2)  Fish Oil Oil (Fish oil) .... Once daily 3)  Baby Aspirin 81 Mg Chew (Aspirin) .... Once daily 4)  Vitamin D3 1000 Unit Tabs (Cholecalciferol) .Marland Kitchen.. 1 by mouth daily 5)  Hydroxyzine Hcl 25 Mg Tabs (Hydroxyzine hcl) .Marland Kitchen.. 1-2 tabs by mouth at hs as needed insomnia 6)  Losartan Potassium 100  Mg Tabs (Losartan potassium) .Marland Kitchen.. 1 by mouth once daily for blood pressure  Other Orders: Pneumococcal Vaccine (98119) Admin 1st Vaccine (14782) Admin 1st Vaccine [facility]) [phone])  Patient Instructions: 1)  Please schedule a follow-up appointment in 3 months. 2)  BMP prior to visit, ICD-9: 3)  Hepatic Panel prior to visit, ICD-9:401.1  995.20 4)  Lipid Panel prior to visit, ICD-9: 5)  TSH prior to visit, ICD-9: Prescriptions: LOSARTAN  POTASSIUM 100 MG TABS (LOSARTAN POTASSIUM) 1 by mouth once daily for blood pressure  #30 x 12   Entered and Authorized by:   Tresa Garter MD   Signed by:   Tresa Garter MD on 06/14/2009   Method used:   Print then Give to Patient   RxID:   [id number] HYDROXYZINE HCL 25 MG TABS (HYDROXYZINE HCL) 1-2 tabs by mouth at hs as needed insomnia  #60 x 3   Entered and Authorized by:   Tresa Garter MD   Signed by:   Tresa Garter MD on 06/14/2009   Method used:   [facility]  [address] [id number]       [address]       [place], [place]  [address]       Ph: [phone] or [phone]       Fax: [phone]   RxID:   [id number]    Pneumovax Vaccine    Vaccine Type: Pneumovax    Site: left deltoid    Mfr: Merck    Dose: 0.5 ml    Route: IM    Given by: Tora Perches    Exp. Date: 05/29/2011    Lot #: [id number]    VIS given: 12/28/95 version given June 14, 2009.

## 2010-07-01 NOTE — Assessment & Plan Note (Signed)
Summary: 1 mos f/u #/cd   Vital Signs:  Patient profile:   77 year old male Height:      67 inches Weight:      135 pounds BMI:     21.22 Temp:     97.5 degrees F oral Pulse rate:   72 / minute Pulse rhythm:   regular Resp:     16 per minute BP sitting:   130 / 66  (left arm) Cuff size:   regular  Vitals Entered By: Lanier Prude, CMA(AAMA) (April 22, 2010 10:07 AM) CC: 1 mo f/u Is Patient Diabetic? No   Primary Care Provider:  Jacinta Shoe, MD  CC:  1 mo f/u.  History of Present Illness: F/u HTN C/o memory loss and stress C/o LBP C/o insomnia  Current Medications (verified): 1)  Hydrocortisone 2.5 %  Lotn (Hydrocortisone) .... Apply Two Times A Day X 4-6 Weeks 2)  Fish Oil 1200 Mg Caps (Omega-3 Fatty Acids) .... Take One By Mouth Once Daily 3)  Baby Aspirin 81 Mg  Chew (Aspirin) .... Once Daily 4)  Vitamin D3 1000 Unit  Tabs (Cholecalciferol) .Marland Kitchen.. 1 By Mouth Daily 5)  Miralax  Powd (Polyethylene Glycol 3350) .... 1/2 or 1 Scoop Once Daily As Needed For Constipation 6)  Triamcinolone Acetonide 0.5 % Crea (Triamcinolone Acetonide) .... Use Two Times A Day Prn 7)  Exforge 5-160 Mg Tabs (Amlodipine Besylate-Valsartan) .Marland Kitchen.. 1 By Mouth Qd  Allergies (verified): 1)  ! Hydrocodone-Acetaminophen (Hydrocodone-Acetaminophen) 2)  Symbicort (Budesonide-Formoterol Fumarate) 3)  Ranitidine Hcl (Ranitidine Hcl)  Past History:  Past Medical History: Last updated: 10/30/2009 Hypertension Low back pain Osteoarthritis Peripheral vascular disease Gout H/o Giant Cell arteritis ? 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 Hemorrhoids Radiation proctitis Cecal arteriovenous malformation Prostate Cancer BPH Diverticulosis  Social History: Last updated: 03/25/2010 Retired Former Smoker Alcohol Use - yes 4 per day Daily Caffeine Use 4 per day Illicit Drug Use - no Widow/Widower 2011  Review of Systems   The patient complains of weight loss.  The patient denies anorexia, abdominal pain, severe indigestion/heartburn, and muscle weakness.    Physical Exam  General:  Well-developed,well-nourished,in no acute distress; alert,appropriate and cooperative throughout examination Nose:  External nasal examination shows no deformity or inflammation. Nasal mucosa are pink and moist without lesions or exudates. Mouth:  Oral mucosa and oropharynx without lesions or exudates.  Teeth in good repair. Lungs:  Lungs are clear to auscultation, no crackles or wheezes. Heart:  Normal rate and regular rhythm. S1 and S2 normal without gallop, murmur, click, rub or other extra sounds. Abdomen:  Bowel sounds positive,abdomen soft and non-tender without masses, organomegaly or hernias noted. Msk:  WNL for age Skin:  AKs on face, scalp, forearms and chest Come SD is present as well Psych:  Oriented X3 and not depressed appearing.     Impression & Recommendations:  Problem # 1:  GRIEF REACTION (ICD-309.0) Assessment Improved Discussed  Problem # 2:  INSOMNIA, CHRONIC (ICD-307.42) Assessment: Comment Only  Problem # 3:  COPD (ICD-496) Assessment: Unchanged  Problem # 4:  HYPERTENSION (ICD-401.9) Assessment: Unchanged  His updated medication list for this problem includes:    Exforge 5-160 Mg Tabs (Amlodipine besylate-valsartan) .Marland Kitchen... 1 by mouth qd  Complete Medication List: 1)  Hydrocortisone 2.5 % Lotn (Hydrocortisone) .... Apply two times a day x 4-6 weeks 2)  Fish Oil 1200 Mg Caps (Omega-3 fatty acids) .... Take one by mouth once daily 3)  Baby Aspirin 81 Mg Chew (Aspirin) .... Once daily 4)  Vitamin D3 1000 Unit Tabs (Cholecalciferol) .Marland Kitchen.. 1 by mouth daily 5)  Miralax Powd (Polyethylene glycol 3350) .... 1/2 or 1 scoop once daily as needed for constipation 6)  Triamcinolone Acetonide 0.5 % Crea (Triamcinolone acetonide) .... Use two times a day prn 7)  Exforge 5-160 Mg Tabs (Amlodipine  besylate-valsartan) .Marland Kitchen.. 1 by mouth qd 8)  Trazodone Hcl 50 Mg Tabs (Trazodone hcl) .Marland Kitchen.. 1 by mouth qhs  Patient Instructions: 1)  Please schedule a follow-up appointment in 2 months AMW. Prescriptions: TRAZODONE HCL 50 MG TABS (TRAZODONE HCL) 1 by mouth qhs  #30 x 6   Entered and Authorized by:   Tresa Garter MD   Signed by:   Tresa Garter MD on 04/22/2010   Method used:   Print then Give to Patient   RxID:   [id number]    Orders Added: 1)  Est. Patient Level IV [44010]

## 2010-07-01 NOTE — Progress Notes (Signed)
Summary: Questions about Miralax   Phone Note Call from Patient Call back at [facility] Phone [phone]   Summary of Call: pt is using Miralax every other day and is having diarrhea.  Pt is not taking daily as directed.  Pt would like to speak with nurse. Initial call taken by: Francee Piccolo CMA Duncan Dull),  November 29, 2009 10:30 AM  Follow-up for Phone Call        discussed the need to titrate Miralax to his bowel habits.  He will call back for further questions Follow-up by: Darcey Nora RN, CGRN,  November 29, 2009 10:42 AM

## 2010-07-01 NOTE — Procedures (Signed)
Summary: Colonoscopy    Colonoscopy  Procedure date:  08/30/2001  Findings:      Results: Polyp.  Results: Hemorrhoids.     Results: Diverticulosis.       Results: Colitis.       Location:  [facility].    Procedures Next Due Date:    Colonoscopy: 08/2003 Patient Name: Raymond Medina, Raymond Medina MRN:  Procedure Procedures: Colonoscopy CPT: 01027.    with Hot Biopsy(s)CPT: [id number].    with polypectomy. CPT: [id number].    with destruction of internal hemorrhoids  Personnel: Endoscopist: Briggette Najarian T. Russella Dar, MD, Clementeen Graham.  Referred By: Linda Hedges Plotnikov, MD.  Exam Location: Exam performed in Outpatient Clinic. Outpatient  Patient Consent: Procedure, Alternatives, Risks and Benefits discussed, consent obtained, from patient. Consent was obtained by the RN.  Indications Symptoms: Hematochezia.  Increased Risk Screening: Personal history of prostate cancer.  History  Pre-Exam Physical: Performed Aug 30, 2001. Entire physical exam was normal.  Exam Exam: Extent of exam reached: Cecum, extent intended: Cecum.  The cecum was identified by appendiceal orifice and IC valve. Colon retroflexion performed. ASA Classification: II. Tolerance: good.  Monitoring: Pulse and BP monitoring, Oximetry used. Supplemental O2 given.  Colon Prep Used Golytely for colon prep. Prep results: good.  Sedation Meds: Patient assessed and found to be appropriate for moderate (conscious) sedation. Fentanyl 100 mcg. given IV. Versed 7 mg. given IV.  Findings NORMAL EXAM: Ascending Colon to Splenic Flexure.  POLYP: Descending Colon, Maximum size: 4 mm. sessile polyp. Procedure:  hot biopsy, removed, retrieved, Polyp sent to pathology. ICD9: Colon Polyps: 211.3.  - DIVERTICULOSIS: Sigmoid Colon. Not bleeding. ICD9: Diverticulosis: 562.10.  ANGIODYSPLASIA (AVMs): Cecum. ICD9: Angiodysplasia without Hemorrhage: 569.84.  POLYP: Sigmoid Colon, Maximum size: 18 mm. sessile polyp. Distance from Anus 30 cm.  Procedure:  snare with cautery, removed, retrieved, sent to pathology. ICD9: Colon Polyps: 211.3.  POLYP: Rectum, Maximum size: 3 mm. sessile polyp. Procedure:  hot biopsy, removed, retrieved, sent to pathology. ICD9: Colon Polyps: 211.3.  RADIATION COLITIS: Rectum. radiation proctitis present, Friability: mild. Activity level mild, ICD9: Colitis, Radiation: 558.2.  HEMORRHOIDS: Internal. Size: Medium. Not bleeding. Not thrombosed. ICD9: Hemorrhoids, Internal: 455.0.  - Injection: Rectum. 2 ccs. Outcome: successful. Comments: 23.4% saline injected for destruction.   Assessment  Diagnoses: 562.10: Diverticulosis.  211.3: Colon Polyps.  558.2: Colitis, Radiation.  455.0: Hemorrhoids, Internal.  569.84: Angiodysplasia without Hemorrhage.   Events  Unplanned Interventions: No intervention was required.  Unplanned Events: There were no complications. Plans  Post Exam Instructions: No aspirin or non-steroidal containing medications: 2 weeks.  Medication Plan: Await pathology. Continue current medications.  Patient Education: Patient given standard instructions for: Polyps. Colitis. Diverticulosis. Hemorrhoids.  Disposition: After procedure patient sent to recovery. After recovery patient sent home.  Scheduling/Referral: Colonoscopy, to [facility] T. Russella Dar, MD, [facility], around Aug 31, 2003.  Primary Care Provider, to Linda Hedges. Plotnikov, MD,    This report was created from the original endoscopy report, which was reviewed and signed by the above listed endoscopist.    cc: Linda Hedges. Plotnikov, MD

## 2010-07-01 NOTE — Assessment & Plan Note (Signed)
Summary: Gastroenterology  Raymond Medina MR#:  [id number] Page #  NAME:  Raymond Medina, Raymond Medina  OFFICE NO:  [id number]  DATE:  08/06/03  DOB:  [birth year]  REFERRING PHYSICIAN:  Dr. Posey Rea.   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  He states he has had problems with intermittent constipation and when constipated, notes a very small amount of bright red blood on the edge of his bowel movement or tissue paper on rare occasion.  He has not had any heavier bleeding.  He has recently started using prune juice, Metamucil, and increased fluids, and his constipation has significantly improved.  He was seen by Dr. Posey Rea and underwent anoscopy about 2 weeks ago with some fresh blood noted in the rectal vault.  No hemorrhoids were noted.    CURRENT MEDICATIONS:  Listed on the chart, updated and reviewed.   ALLERGIES:  None known.   PHYSICAL EXAMINATION:  No acute distress.  Weight 160 pounds; blood pressure is 186/94; pulse 80 and regular.  Chest:  Clear to auscultation and percussion.  Cardiac:  Regular rate and rhythm without murmurs.  Abdomen:  Soft, nontender and nondistended with normoactive bowel sounds; no palpable organomegaly, masses or hernias.  Rectal examination deferred to time of colonoscopy.  See recent anoscopy findings by Dr. Posey Rea.   ASSESSMENT AND PLAN:   1.  Small volume hematochezia.  Likely related to known mild radiation proctitis.  Long-term management of his constipation should help this problem.  If the bleeding becomes heavier or frequently persistent, we could proceed with APC ablation therapy.  2.  Constipation.  Agree with daily Metamucil and daily prune juice as well increased fluid intake.  This management plan has already worked well for him.   3.   Personal history of a tubulovillous adenoma.  Repeat colonoscopy is due.  Risks, benefits, and alternatives to colonoscopy with possible biopsy and possible polypectomy discussed with the patient, and he consents to proceed.  If his rectal bleeding is persistent, we will consider APC therapy at that time.    Venita Lick. Russella Dar, M.D., F.A.C.G. [id number] cc:  Dr. Posey Rea D:  08/06/03; T:  ; Job [id number]

## 2010-07-03 NOTE — Assessment & Plan Note (Addendum)
Summary: annual medicare physical-lb   Vital Signs:  Patient profile:   77 year old male Height:      67 inches Weight:      144 pounds Temp:     97.8 degrees F oral Pulse rate:   84 / minute Pulse rhythm:   regular Resp:     16 per minute BP sitting:   170 / 90  (left arm) Cuff size:   regular  Vitals Entered By: Lanier Prude, CMA(AAMA) (June 23, 2010 10:32 AM) CC: MWV Comments pt is not taking Trazodone   Primary Care Provider:  Jacinta Shoe, MD  CC:  MWV.  History of Present Illness: The patient presents for a preventive health examination  Patient past medical history, social history, and family history reviewed in detail no significant changes.  Patient is physically active. Depression is better and mood is good. Hearing is normal, and able to perform activities of daily living. Risk of falling is negligible and home safety has been reviewed and is appropriate. Patient has normal height, weight, and visual acuity is nl w/glasses. Patient has been counseled on age-appropriate routine health concerns for screening and prevention. Education, counseling done. Cognition is nl.  Preventive Screening-Counseling & Management  Alcohol-Tobacco     Alcohol drinks/day: 4     Smoking Status: quit     Year Quit: 1972  Caffeine-Diet-Exercise     Caffeine use/day: 4     Diet Counseling: to improve diet; diet is suboptimal     Nutrition Referrals: no     Does Patient Exercise: no     Depression Counseling: further diagnostic testing and/or other treatment is indicated  Hep-HIV-STD-Contraception     Hepatitis Risk: no risk noted     Dental Visit-last 6 months yes     TSE monthly: yes     Sun Exposure-Excessive: no  Safety-Violence-Falls     Seat Belt Use: yes     Helmet Use: n/a     Firearms in the Home: firearms in the home     Smoke Detectors: yes     Violence in the Home: no risk noted     Sexual Abuse: no     Fall Risk: none  Current Medications  (verified): 1)  Hydrocortisone 2.5 %  Lotn (Hydrocortisone) .... Apply Two Times A Day X 4-6 Weeks 2)  Fish Oil 1200 Mg Caps (Omega-3 Fatty Acids) .... Take One By Mouth Once Daily 3)  Baby Aspirin 81 Mg  Chew (Aspirin) .... Once Daily 4)  Vitamin D3 1000 Unit  Tabs (Cholecalciferol) .Marland Kitchen.. 1 By Mouth Daily 5)  Miralax  Powd (Polyethylene Glycol 3350) .... 1/2 or 1 Scoop Once Daily As Needed For Constipation 6)  Triamcinolone Acetonide 0.5 % Crea (Triamcinolone Acetonide) .... Use Two Times A Day Prn 7)  Exforge 5-160 Mg Tabs (Amlodipine Besylate-Valsartan) .Marland Kitchen.. 1 By Mouth Qd 8)  Trazodone Hcl 50 Mg Tabs (Trazodone Hcl) .Marland Kitchen.. 1 By Mouth Qhs  Allergies (verified): 1)  ! Hydrocodone-Acetaminophen (Hydrocodone-Acetaminophen) 2)  Symbicort (Budesonide-Formoterol Fumarate) 3)  Ranitidine Hcl (Ranitidine Hcl)  Past History:  Family History: Last updated: 10/31/2009 Family History Hypertension Family History of Stomach Cancer: mother No FH of Colon Cancer: Family History of Diabetes: nephew  Social History: Last updated: 03/25/2010 Retired Former Smoker Alcohol Use - yes 4 per day Daily Caffeine Use 4 per day Illicit Drug Use - no Widow/Widower 2011  Past Medical History: Hypertension Low back pain Osteoarthritis Peripheral vascular disease Gout H/o Giant Cell arteritis ?  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 Hemorrhoids Radiation proctitis Cecal arteriovenous malformation Prostate Cancer BPH Diverticulosis Prostate cancer, hx of 2000  Past Surgical History: Radiation seed implantation 2000 Cataract Extraction bilateral Colon 2011 Dr Russella Dar  Social History: Caffeine use/day:  4 Does Patient Exercise:  no Dental Care w/in 6 mos.:  yes Sun Exposure-Excessive:  no Seat Belt Use:  yes Fall Risk:  none Hepatitis Risk:  no risk noted  Review of Systems  The patient denies anorexia, fever, weight loss, vision loss,  decreased hearing, hoarseness, chest pain, syncope, dyspnea on exertion, peripheral edema, prolonged cough, headaches, hemoptysis, abdominal pain, melena, hematochezia, severe indigestion/heartburn, hematuria, incontinence, genital sores, muscle weakness, suspicious skin lesions, transient blindness, difficulty walking, depression, unusual weight change, abnormal bleeding, enlarged lymph nodes, angioedema, and testicular masses.    Physical Exam  General:  Well-developed,well-nourished,in no acute distress; alert,appropriate and cooperative throughout examination Head:  Normocephalic and atraumatic without obvious abnormalities. No apparent alopecia or balding. Eyes:  No corneal or conjunctival inflammation noted. EOMI. Perrla. Ears:  External ear exam shows no significant lesions or deformities.  Otoscopic examination reveals clear canals, tympanic membranes are intact bilaterally without bulging, retraction, inflammation or discharge. Hearing is grossly normal bilaterally. Nose:  External nasal examination shows no deformity or inflammation. Nasal mucosa are pink and moist without lesions or exudates. Mouth:  Oral mucosa and oropharynx without lesions or exudates.  Teeth in good repair. Neck:  No deformities, masses, or tenderness noted. R carotid bruit and L carotid bruit.   Lungs:  Lungs are clear to auscultation, no crackles or wheezes. Heart:  Normal rate and regular rhythm. S1 and S2 normal without gallop, murmur, click, rub or other extra sounds. Abdomen:  Bowel sounds positive,abdomen soft and non-tender without masses, organomegaly or hernias noted. Genitalia:  WNL Prostate:  small prostate no nodules and no asymmetry.   Msk:  WNL for age Extremities:  no edema Neurologic:  No cranial nerve deficits noted. Station and gait are normal.   Skin:  AKs on face, scalp, forearms and chest Come SD is present as well Psych:  Oriented X3 and not depressed appearing.     Impression &  Recommendations:  Problem # 1:  ROUTINE GENERAL MEDICAL EXAM@HEALTH  CARE FACL (ICD-V70.0) Assessment New Get labs Orders: EKG w/ Interpretation (93000)OK Medicare -1st Annual Wellness Visit [phone]) TLB-BMP (Basic Metabolic Panel-BMET) (80048-METABOL) TLB-CBC Platelet - w/Differential (85025-CBCD) TLB-Hepatic/Liver Function Pnl (80076-HEPATIC) TLB-Lipid Panel (80061-LIPID) TLB-PSA (Prostate Specific Antigen) (84153-PSA) TLB-TSH (Thyroid Stimulating Hormone) (84443-TSH) TLB-Udip ONLY (81003-UDIP) Overall doing well, age appropriate education and counseling updated and referral for appropriate preventive services done unless declined, immunizations up to date or declined, diet counseling done if overweight, urged to quit smoking if smokes, most recent labs reviewed and current ordered if appropriate, ecg reviewed or declined (interpretation per ECG scanned in the EMR if done); information regarding Medicare Preventation requirements given if appropriate.  I have personally reviewed the Medicare Annual Wellness questionnaire and have noted 1.   The patient's medical and social history 2.   Their use of alcohol, tobacco or illicit drugs 3.   Their current medications and supplements 4.   The patient's functional ability including ADL's, fall risks, home safety risks and hearing or visual             impairment. 5.   Diet and physical activities 6.   Evidence for depression or mood disorders The patients weight, height, BMI and visual acuity have  been recorded in the chart I have made referrals, counseling and provided education to the patient based review of the above and I have provided the pt with a written personalized care plan for preventive services.  Problem # 2:  GRIEF REACTION (ICD-309.0) Assessment: Improved Discussed  Problem # 3:  WEIGHT LOSS (ICD-783.21) Assessment: Improved  Problem # 4:  HYPERTENSION (ICD-401.9) Assessment: Deteriorated Risks of noncompliance with  treatment discussed. Compliance encouraged.  His updated medication list for this problem includes:    Exforge 5-160 Mg Tabs (Amlodipine besylate-valsartan) .Marland Kitchen... 1 by mouth qd  Problem # 5:  PROSTATE CANCER, HX OF (ICD-V10.46) Assessment: New  Problem # 6:  INSOMNIA, CHRONIC (ICD-307.42) Assessment: Improved  Complete Medication List: 1)  Fish Oil 1200 Mg Caps (Omega-3 fatty acids) .... Take one by mouth once daily 2)  Baby Aspirin 81 Mg Chew (Aspirin) .... Once daily 3)  Vitamin D3 1000 Unit Tabs (Cholecalciferol) .Marland Kitchen.. 1 by mouth daily 4)  Miralax Powd (Polyethylene glycol 3350) .... 1/2 or 1 scoop once daily as needed for constipation 5)  Triamcinolone Acetonide 0.5 % Crea (Triamcinolone acetonide) .... Use two times a day prn 6)  Exforge 5-160 Mg Tabs (Amlodipine besylate-valsartan) .Marland Kitchen.. 1 by mouth qd  Patient Instructions: 1)  Please schedule a follow-up appointment in 3 months. Prescriptions: TRIAMCINOLONE ACETONIDE 0.5 % CREA (TRIAMCINOLONE ACETONIDE) use two times a day prn  #30g x 3   Entered and Authorized by:   Tresa Garter MD   Signed by:   Tresa Garter MD on 06/23/2010   Method used:   Print then Give to Patient   RxID:   [id number]    Orders Added: 1)  EKG w/ Interpretation [93000] 2)  Medicare -1st Annual Wellness Visit [G0438] 3)  TLB-BMP (Basic Metabolic Panel-BMET) [80048-METABOL] 4)  TLB-CBC Platelet - w/Differential [85025-CBCD] 5)  TLB-Hepatic/Liver Function Pnl [80076-HEPATIC] 6)  TLB-Lipid Panel [80061-LIPID] 7)  TLB-PSA (Prostate Specific Antigen) [84153-PSA] 8)  TLB-TSH (Thyroid Stimulating Hormone) [84443-TSH] 9)  TLB-Udip ONLY [81003-UDIP] 10)  Est. Patient Level III [14782]     Appended Document: annual medicare physical-lb     Clinical Lists Changes  Observations: Added new observation of BMI: 22.64  (06/30/2010 17:39) Added new observation of WEIGHT: 144 lb (06/30/2010 17:39)       Vital Signs:  Patient  Profile:   77 Years Old Male Height:     67 inches (170.18 cm) Weight:      144 pounds BMI:     22.64

## 2010-08-14 LAB — COMPREHENSIVE METABOLIC PANEL
ALT: 22 U/L (ref 0–53)
AST: 24 U/L (ref 0–37)
Albumin: 3.8 g/dL (ref 3.5–5.2)
Alkaline Phosphatase: 53 U/L (ref 39–117)
CO2: 24 mEq/L (ref 19–32)
Chloride: 106 mEq/L (ref 96–112)
GFR calc Af Amer: >60 mL/min (ref >60)
GFR calc non Af Amer: >60 mL/min (ref >60)
Potassium: 4.1 mEq/L (ref 3.5–5.1)
Total Bilirubin: 1.1 mg/dL (ref 0.3–1.2)

## 2010-08-14 LAB — DIFFERENTIAL
Basophils Absolute: 0 10*3/uL (ref 0.0–0.1)
Basophils Relative: 0 % (ref 0–1)
Eosinophils Absolute: 0 10*3/uL (ref 0.0–0.7)
Eosinophils Relative: 0 % (ref 0–5)
Monocytes Absolute: 0.4 10*3/uL (ref 0.1–1.0)

## 2010-08-14 LAB — CBC
Hemoglobin: 12.1 g/dL — ABNORMAL LOW (ref 13.0–17.0)
MCH: 36.4 pg — ABNORMAL HIGH (ref 26.0–34.0)
MCV: 104.1 fL — ABNORMAL HIGH (ref 78.0–100.0)
Platelets: 205 10*3/uL (ref 150–400)
RBC: 3.32 MIL/uL — ABNORMAL LOW (ref 4.22–5.81)
WBC: 12.7 10*3/uL — ABNORMAL HIGH (ref 4.0–10.5)

## 2010-09-09 ENCOUNTER — Emergency Department (HOSPITAL_COMMUNITY): Payer: Medicare Other

## 2010-09-09 ENCOUNTER — Inpatient Hospital Stay (HOSPITAL_COMMUNITY)
Admission: EM | Admit: 2010-09-09 | Discharge: 2010-09-10 | DRG: 066 | Disposition: A | Discharge status: Home / Self Care | Payer: Medicare Other | Attending: Internal Medicine | Admitting: Internal Medicine

## 2010-09-09 DIAGNOSIS — J449 Chronic obstructive pulmonary disease, unspecified: Secondary | ICD-10-CM | POA: Diagnosis present

## 2010-09-09 DIAGNOSIS — Z888 Allergy status to other drugs, medicaments and biological substances status: Secondary | ICD-10-CM

## 2010-09-09 DIAGNOSIS — J4489 Other specified chronic obstructive pulmonary disease: Secondary | ICD-10-CM | POA: Diagnosis present

## 2010-09-09 DIAGNOSIS — Z8546 Personal history of malignant neoplasm of prostate: Secondary | ICD-10-CM

## 2010-09-09 DIAGNOSIS — I639 Cerebral infarction, unspecified: Secondary | ICD-10-CM

## 2010-09-09 DIAGNOSIS — I6350 Cerebral infarction due to unspecified occlusion or stenosis of unspecified cerebral artery: Principal | ICD-10-CM | POA: Diagnosis present

## 2010-09-09 DIAGNOSIS — M1990 Unspecified osteoarthritis, unspecified site: Secondary | ICD-10-CM | POA: Diagnosis present

## 2010-09-09 DIAGNOSIS — M545 Low back pain, unspecified: Secondary | ICD-10-CM | POA: Diagnosis present

## 2010-09-09 DIAGNOSIS — Z7902 Long term (current) use of antithrombotics/antiplatelets: Secondary | ICD-10-CM

## 2010-09-09 DIAGNOSIS — I10 Essential (primary) hypertension: Secondary | ICD-10-CM | POA: Diagnosis present

## 2010-09-09 HISTORY — DX: Cerebral infarction, unspecified: I63.9

## 2010-09-09 LAB — URINALYSIS, ROUTINE W REFLEX MICROSCOPIC
Hgb urine dipstick: NEGATIVE
Nitrite: NEGATIVE
Specific Gravity, Urine: 1.013 (ref 1.005–1.030)
Urobilinogen, UA: 1 mg/dL (ref 0.0–1.0)

## 2010-09-09 LAB — POCT I-STAT, CHEM 8
BUN: 21 mg/dL (ref 6–23)
Chloride: 101 mEq/L (ref 96–112)
Creatinine, Ser: 1.3 mg/dL (ref 0.4–1.5)
Sodium: 135 mEq/L (ref 135–145)

## 2010-09-09 LAB — CBC
HCT: 32.7 % — ABNORMAL LOW (ref 39.0–52.0)
Hemoglobin: 11.7 g/dL — ABNORMAL LOW (ref 13.0–17.0)
MCH: 34.4 pg — ABNORMAL HIGH (ref 26.0–34.0)
MCV: 96.2 fL (ref 78.0–100.0)
RBC: 3.4 MIL/uL — ABNORMAL LOW (ref 4.22–5.81)

## 2010-09-09 LAB — SEDIMENTATION RATE: Sed Rate: 6 mm/hr (ref 0–16)

## 2010-09-09 LAB — CK TOTAL AND CKMB (NOT AT ARMC)
CK, MB: 0.9 ng/mL (ref 0.3–4.0)
Relative Index: INVALID (ref 0.0–2.5)
Total CK: 38 U/L (ref 7–232)

## 2010-09-09 LAB — GLUCOSE, CAPILLARY

## 2010-09-09 LAB — PROTIME-INR: Prothrombin Time: 13.5 seconds (ref 11.6–15.2)

## 2010-09-09 LAB — DIFFERENTIAL
Lymphs Abs: 1.1 10*3/uL (ref 0.7–4.0)
Monocytes Relative: 9 % (ref 3–12)
Neutro Abs: 4.5 10*3/uL (ref 1.7–7.7)
Neutrophils Relative %: 70 % (ref 43–77)

## 2010-09-09 LAB — COMPREHENSIVE METABOLIC PANEL
ALT: 40 U/L (ref 0–53)
Albumin: 4 g/dL (ref 3.5–5.2)
Calcium: 9.1 mg/dL (ref 8.4–10.5)
GFR calc Af Amer: >60 mL/min (ref >60)
Glucose, Bld: 106 mg/dL — ABNORMAL HIGH (ref 70–99)
Sodium: 135 mEq/L (ref 135–145)
Total Protein: 6.4 g/dL (ref 6.0–8.3)

## 2010-09-09 LAB — TROPONIN I: Troponin I: 0.01 ng/mL (ref 0.00–0.06)

## 2010-09-10 DIAGNOSIS — G459 Transient cerebral ischemic attack, unspecified: Secondary | ICD-10-CM

## 2010-09-10 DIAGNOSIS — I517 Cardiomegaly: Secondary | ICD-10-CM

## 2010-09-10 LAB — GLUCOSE, CAPILLARY
Glucose-Capillary: 119 mg/dL — ABNORMAL HIGH (ref 70–99)
Glucose-Capillary: 103 mg/dL — ABNORMAL HIGH (ref 70–99)
Glucose-Capillary: 139 mg/dL — ABNORMAL HIGH (ref 70–99)

## 2010-09-10 LAB — CBC
MCH: 34.7 pg — ABNORMAL HIGH (ref 26.0–34.0)
MCHC: 35.7 g/dL (ref 30.0–36.0)
Platelets: 202 10*3/uL (ref 150–400)
RBC: 3.17 MIL/uL — ABNORMAL LOW (ref 4.22–5.81)

## 2010-09-10 LAB — BASIC METABOLIC PANEL
BUN: 16 mg/dL (ref 6–23)
Calcium: 8.7 mg/dL (ref 8.4–10.5)
Creatinine, Ser: 0.98 mg/dL (ref 0.4–1.5)
GFR calc non Af Amer: >60 mL/min (ref >60)

## 2010-09-10 LAB — LIPID PANEL: Cholesterol: 160 mg/dL (ref 0–200)

## 2010-09-10 LAB — HEMOGLOBIN A1C
Hgb A1c MFr Bld: 5.2 % (ref <5.7)
Hgb A1c MFr Bld: 5.6 % (ref <5.7)

## 2010-09-15 ENCOUNTER — Telehealth: Payer: Self-pay | Admitting: *Deleted

## 2010-09-15 NOTE — Telephone Encounter (Signed)
Pt was d/c'd from hospital - he was put on plavix and has f/u ov on 4/23. Should he continue plavix?

## 2010-09-15 NOTE — Telephone Encounter (Signed)
Yes. Thx.

## 2010-09-16 NOTE — Telephone Encounter (Signed)
Pt aware.

## 2010-09-17 ENCOUNTER — Encounter: Payer: Self-pay | Admitting: Internal Medicine

## 2010-09-17 DIAGNOSIS — M545 Low back pain: Secondary | ICD-10-CM

## 2010-09-17 NOTE — H&P (Signed)
Raymond Medina, Raymond Medina                   ACCOUNT NO.:  [id number]  MEDICAL RECORD NO.:  [id number]           PATIENT TYPE:  I  LOCATION:  3007                         FACILITY:  [facility]  PHYSICIAN:  Raymond Mody, MD       DATE OF BIRTH:  September 09, 1926  DATE OF ADMISSION:  09/09/2010 DATE OF DISCHARGE:                             HISTORY & PHYSICAL   PRIMARY CARE PHYSICIAN:  Raymond Quint. Plotnikov, MD of [facility]  CHIEF COMPLAINT:  Drooling this morning.  HISTORY OF PRESENT ILLNESS:  This is a very pleasant 77 year old male with a history of hypertension and GI bleeding who reports trying to drink a cup of coffee at approximately 7:00 a.m. this morning and having drooling -the coffee came out of the left side of his mouth. He tried to focus on his newspaper and noticed that the words seemed to overlap each other.  When his inability to drink coffee persisted, he called his sister Raymond Medina in [place], and she told him to go to the emergency department.  The patient's symptoms have now resolved. He does not complain of any other symptoms, such as focal weakness, headache, dizziness, or vomiting.  PAST MEDICAL HISTORY:  Significant for: 1. History of giant cell arteritis per Dr. Posey Rea.  This was many     years ago and resolved after a course of prednisone.  However, the     patient was left with very poor vision in his left eye. 2. Chronic obstructive pulmonary disease. 3. Hypertension. 4. Low back pain. 5. Osteoarthritis. 6. Gout. 7. History of tubulovillous adenoma, colon polyp. 8. Prostate cancer status post radiation and seed implants. 9. History of radiation proctitis. 10.Cecal AVM.  HOME MEDICATIONS: 1. Exforge 5/160 1 tablet p.o. daily. 2. Fish oil 1200 mg caplets 1 by mouth daily. 3. Baby aspirin 81 mg once daily. 4. Vitamin D3 1000 units 1 tablet daily. 5. MiraLax 17 g daily as needed for constipation. 6. Triamcinolone 0.5% cream used 2 times a day  p.r.n.  The patient has allergy to: 1. HYDROCODONE. 2. ACETAMINOPHEN. 3. SYMBICORT. 4. RANITIDINE.  REVIEW OF SYSTEMS:  Positive for insomnia, constipation, increased stress level, and depression over the death of his wife in 04/30/10.  Otherwise, all systems were reviewed and found to be negative.  SOCIAL HISTORY:  Positive for alcohol.  He drinks approximately four drinks a day.  Negative for tobacco.  He quit smoking in 1972.  He has had a career with the [employer].  He is a full code.  He has one son who does not keep in touch with him.  He has a sister in [place] who would like for him to come live near her.  FAMILY HISTORY:  His mother died with stomach cancer and hypertension, his nephew with diabetes mellitus.  Otherwise, his family history is unknown to him, as he tells me he left home at age 32 to join the [employer].  PHYSICAL EXAMINATION:  GENERAL:  This is a well-developed, well- nourished thin Caucasian male lying in no apparent distress in [facility] ED. VITAL SIGNS:  Temperature 97.8, pulse 74, respirations 18, blood pressure 157/92. HEAD:  Atraumatic, normocephalic. EYES:  Anicteric with pupils that are slightly asymmetric but round. 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 NOSE:  No exterior lesions or drainage. MOUTH:  Moist mucous membranes with no erythema or exudates in his oropharynx. NECK:  Supple with midline trachea.  No JVD.  No lymphadenopathy. CHEST:  Demonstrates no accessory muscle use.  He has no wheezes or crackles to my exam. HEART:  Slightly difficult to hear, but sounds as though has a regular rate and rhythm.  No obvious murmurs, rubs, or gallops. ABDOMEN:  Thin, soft, nontender, nondistended with normal bowel sounds. No surgical scars. EXTREMITIES:  No clubbing, cyanosis, or edema.  He has a 5/5 strength in each extremity. SKIN:  No rashes, bruises, or lesions. NEURO:  Cranial nerves II through XII are grossly  intact as mentioned with his left pupil being slightly asymmetric.  He also has a slight left-sided droop to his mouth.  Otherwise, he is neurologically intact. PSYCHIATRIC:  The patient is alert and oriented.  His demeanor is pleasant, cooperative.  His grooming is good.  LABORATORY DATA:  Pertinent for a hemoglobin of 11.9, hematocrit 35.0, white count 6.4, platelets 206.  BUN 21, creatinine 1.3, glucose 104. Cardiac enzymes x1 negative.  CT of his head done this morning shows no acute ischemia, no hemorrhage.  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  ASSESSMENT:  Dr. Kathlen Medina has seen and examined the patient, collected history, reviewed his chart, and spoken at length with the patient and the PA about the case.  Further we have talked to Neurology and Dr. Posey Rea.  Her assessment is as follows. 1. This is a very pleasant 77 year old man who likely had a TIA with     left-sided facial droop and visual changes.  However, there is a     need to rule out vasculitis given his history of giant cell     arteritis and optic neuritis. 2. Hypertension. 3. Chronic obstructive pulmonary disease. 4. History of gastrointestinal bleeding.  PLAN: 1. We will admit him to neuro tele for stroke, TIA workup inclusive of     2D echo, carotid Dopplers, MRI and MRA of the brain,serologies including an ESR, as well as physical therapy and     occupational therapy.  We talked with Neurology for medicine     recommendations as he is a higher than average risk for GI     bleeding, and he is already on a daily 81 mg aspirin.  Neurology     recommended that his aspirin be discontinued, and he be started on     Plavix.  Further if there is anything abnormal about his MRI or     MRA, they will be consulted for a more full evaluation. 2. We also spoke with Dr. Posey Rea who told us that the patient's     giant cell arteritis is not an active problem, it     have been  several years ago, and after a course of prednisone he     has not had any further issues. 3. Further recommendations will be forthcoming pending this patient's     medical evolution.  This patient is a full code.     Stephani Police, PA   ______________________________ Raymond Mody, MD    MLY/MEDQ  D:  09/09/2010  T:  09/10/2010  Job:  [id number]  cc:   Raymond Quint. Plotnikov, MD  Electronically Signed by Algis Downs PA on 09/17/2010 09:27:57 AM Electronically Signed by Raymond Mody MD on 09/17/2010 09:48:05 PM

## 2010-09-22 ENCOUNTER — Ambulatory Visit (INDEPENDENT_AMBULATORY_CARE_PROVIDER_SITE_OTHER): Payer: Medicare Other | Admitting: Internal Medicine

## 2010-09-22 ENCOUNTER — Encounter: Payer: Self-pay | Admitting: Internal Medicine

## 2010-09-22 DIAGNOSIS — I639 Cerebral infarction, unspecified: Secondary | ICD-10-CM | POA: Insufficient documentation

## 2010-09-22 DIAGNOSIS — I6350 Cerebral infarction due to unspecified occlusion or stenosis of unspecified cerebral artery: Secondary | ICD-10-CM

## 2010-09-22 DIAGNOSIS — G459 Transient cerebral ischemic attack, unspecified: Secondary | ICD-10-CM | POA: Insufficient documentation

## 2010-09-22 MED ORDER — CLOPIDOGREL BISULFATE 75 MG PO TABS: 75.0000 mg | ORAL_TABLET | Freq: Every day | ORAL | Status: DC

## 2010-09-22 MED ORDER — ZOLPIDEM TARTRATE 10 MG PO TABS: 10.0000 mg | ORAL_TABLET | Freq: Every evening | ORAL | Status: DC | PRN

## 2010-09-22 NOTE — Discharge Summary (Signed)
  Raymond Medina, Raymond Medina NO.:  [id number]  MEDICAL RECORD NO.:  [id number]           PATIENT TYPE:  I  LOCATION:  3007                         FACILITY:  [facility]  PHYSICIAN:  Raymond Cove, MD     DATE OF BIRTH:  February 10, 1927  DATE OF ADMISSION:  09/09/2010 DATE OF DISCHARGE:  09/10/2010                              DISCHARGE SUMMARY   PRIMARY CARE PHYSICIAN:  Raymond Quint. Plotnikov, MD, [employer].  DISCHARGE DIAGNOSES: 1. Small right frontal parietal cerebrovascular accident. 2. Hypertension. 3. 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 4. History of chronic obstructive pulmonary disease. 5. Low back pain. 6. Osteoarthritis. 7. History of gout. 8. History of prostate cancer status post radiation and seed implants. 9. History of radiation proctitis. 10.History of cecal arteriovenous malformations.  DISCHARGE MEDICATIONS: 1. Plavix 75 mg daily. 2. Exforge (amlodipine/valsartan) 5/160 mg p.o. daily. 3. Fish oil over-the-counter 1 capsule daily.  CONSULTATIONS:  Raymond Farr, MD, Neurology.  DIAGNOSTIC INVESTIGATIONS:  CT of the head on September 09, 2010 shows no acute intracranial changes, old thalamic and basal ganglia lacunar infarct.  MRI/MRA of the brain on September 09, 2010 shows small acute right frontal parietal white matter infarct and moderate chronic microvascular ischemic changes.  MRA shows mild intracranial atherosclerotic disease without large vessel occlusion.  HOSPITAL COURSE:  [name] is an 77 year old gentleman with a history of hypertension who presented to the hospital with drooling facial droop, blurring, and double lesion.  On evaluation, he was found to have a small frontal CVA, started on Plavix instead of his baby aspirin.  He was seen by Neurology in consultation.  He also had appropriately passed his swallow screen.  He also had a 2-D echocardiogram that showed normal ejection fraction of 55-60%, mildly  dilated left atrium, wall motion was normal, and no significant valvular disease.  The patient also had PT/OT evaluation who felt he was back to his baseline and did not require ongoing physical therapy needs.  The patient is being discharged home in stable condition to follow up with primary physician Dr. Posey Medina in 1 week.  DISCHARGE CONDITION:  Stable.  DISCHARGE VITAL SIGNS:  Temperature is 97. 6, pulse 68, blood pressure 114/66, respirations 18, and saturating 95% on room air.  DISCHARGE FOLLOWUP:  Dr. Macarthur Critchley Medina in 1 week.     Raymond Cove, MD     PJ/MEDQ  D:  09/10/2010  T:  09/11/2010  Job:  [id number]  cc:   Raymond Quint. Plotnikov, MD  Electronically Signed by Raymond Medina  on 09/22/2010 08:01:49 PM

## 2010-09-22 NOTE — Assessment & Plan Note (Signed)
Hosp. Documents reviewed Cont Plavix

## 2010-09-22 NOTE — Progress Notes (Signed)
  Subjective:    Patient ID: Raymond Medina, male    DOB: 11-24-1926, 77 y.o.   MRN: [id number]  HPI Hosp f/up Pt reported trying  to drink a cup of coffee at approximately 7:00 a.m. On 4/10 and  having drooling -the coffee came out of the left side of his mouth.  He tried to focus on his newspaper and noticed that the words seemed to  overlap each other. When his inability to drink coffee persisted, he  called his sister [name] in [place], and she told him to go  to the emergency department. The patient's symptoms have now resolved.  He does not complain of any other symptoms, such as focal weakness,  headache, dizziness, or vomiting. He had a CVA by an MRI.    Review of Systems  Constitutional: Positive for fatigue. Negative for appetite change and unexpected weight change.  HENT: Negative for nosebleeds, congestion, sore throat, sneezing, trouble swallowing and neck pain.   Eyes: Negative for itching and visual disturbance.  Respiratory: Negative for cough and wheezing.   Cardiovascular: Negative for chest pain, palpitations and leg swelling.  Gastrointestinal: Negative for nausea, diarrhea, blood in stool and abdominal distention.  Genitourinary: Negative for frequency and hematuria.  Musculoskeletal: Positive for back pain. Negative for joint swelling and gait problem.  Skin: Negative for rash.  Neurological: Positive for weakness. Negative for dizziness, tremors and speech difficulty.  Psychiatric/Behavioral: Negative for sleep disturbance, dysphoric mood and agitation. The patient is nervous/anxious (Sad).        Objective:   Physical Exam  Constitutional: He is oriented to person, place, and time. He appears well-developed.  HENT:  Mouth/Throat: Oropharynx is clear and moist.  Eyes: Conjunctivae are normal. Pupils are equal, round, and reactive to light.  Neck: Normal range of motion. No JVD present. No thyromegaly present.  Cardiovascular: Normal rate, regular  rhythm, normal heart sounds and intact distal pulses.  Exam reveals no gallop and no friction rub.   No murmur heard. Pulmonary/Chest: Effort normal and breath sounds normal. No respiratory distress. He has no wheezes. He has no rales. He exhibits no tenderness.  Abdominal: Soft. Bowel sounds are normal. He exhibits no distension and no mass. There is no tenderness. There is no rebound and no guarding.  Musculoskeletal: Normal range of motion. He exhibits no edema and no tenderness.  Lymphadenopathy:    He has no cervical adenopathy.  Neurological: He is alert and oriented to person, place, and time. He has normal reflexes. No cranial nerve deficit. He exhibits normal muscle tone. Coordination normal.       Forgetful  Skin: Skin is warm and dry. No rash noted.  Psychiatric: He has a normal mood and affect. His behavior is normal. Judgment and thought content normal.          Assessment & Plan:  TIA (transient ischemic attack) Hosp. Documents reviewed Cont Plavix  CVA (cerebral infarction) On Plavix    HTN  On Rx  Total time over 45 mins > 50% spent counseling patient with regards to the problems above, coordination of care and treatment options.

## 2010-09-22 NOTE — Assessment & Plan Note (Signed)
On Plavix 

## 2010-09-23 ENCOUNTER — Encounter: Payer: Self-pay | Admitting: Internal Medicine

## 2010-09-24 NOTE — Consult Note (Signed)
NAMESHEENA, SIMONIS NO.:  [id number]  MEDICAL RECORD NO.:  [id number]           PATIENT TYPE:  I  LOCATION:  3007                         FACILITY:  [facility]  PHYSICIAN:  Thana Farr, MD    DATE OF BIRTH:  1926-10-13  DATE OF CONSULTATION:  09/10/2010 DATE OF DISCHARGE:  09/10/2010                                CONSULTATION   CHIEF COMPLAINT:  Left facial droop, which is now resolved.  HISTORY OF PRESENT ILLNESS:  This is an 77 year old male with past medical history of giant cell arthritis, COPD, hypertension, low back pain, osteoarthritis, gout, colon polyps, and radiation-induced proctitis along with cecal AVM.  The patient states that he takes an aspirin 81 mg daily.  The patient was at his baseline on September 09, 2010, after he awoke at 6 a.m.  At approximately 7 a.m., he was pouring himself a cup of coffee and sipped coffee noting that it drooled on the left aspect of his cheek.  At that moment, his daughter called him and he explained symptoms to her, but did not note any dysarthria or dysphasia at that time.  The daughter recommended that he call 911 and go to the emergency room to get further evaluated, which he did. Initial CT scan showed no acute infarct; however, followup MRI of brain showed a small acute infarct in the right frontal parietal white matter. The patient's MRA of brain showed no significant intracranial atherosclerosis.  At the present time, the patient is now fully resolved, states that all his symptoms have dissipated.  He is having no difficulties with speaking.  No difficulties with strength.  No difficulties with eating.  PAST MEDICAL HISTORY: 1. Giant-cell arthritis. 2. COPD. 3. Hypertension. 4. Back pain. 5. Osteoarthritis. 6. Gout. 7. Tubular adenoma. 8. Colon polyps. 9. Radiation proctitis. 10.Fecal AVM. 11.Multiple lacunar infarcts found by MRI.  Stroke risk factors include hypertension.  MEDICATIONS:   Exforge, fish oil, baby aspirin 81 mg daily, vitamin D, MiraLax.  ALLERGIES:  HYDROCODONE, ACETAMINOPHEN, SYMBICORT, RANITIDINE.  FAMILY HISTORY:  Stomach cancer, hypertension, and diabetes.  SOCIAL HISTORY:  The patient quit smoking in 1972.  He does drink occasionally.  He does not do illicit drugs.  REVIEW OF SYSTEMS:  Negative with the exception of above.  PHYSICAL EXAMINATION:  VITAL SIGNS:  Temperature is 97.6, blood pressure is 114/66, heart rate 68, respiration is 18. LUNGS:  Clear to auscultation bilaterally. NECK:  Negative bruits. CARDIOVASCULAR:  Regular rate and rhythm. ABDOMEN:  Soft, nontender, and nondistended. SKIN:  Warm, dry, and intact. NEUROLOGIC:  Cranial nerves II through XII are grossly intact.  Pupils equal, round, and reactive to light and accommodating.  Extraocular movements are intact.  Tongue is midline.  Face is equal, symmetrical. The patient shows no dysarthria.  Strength is 5/5 throughout.  No drift, no tremor, no asterixis.  Deep tendon reflexes are 1+ throughout.  The patient's sensation is grossly intact to pinprick and light touch throughout.  TEST RESULTS:  ESR 6.  HbA1c is 5.2.  UA is negative.  Alcohol is less than 5.  Sodium 137,  potassium 4.1, chloride 106, CO2 of 25, BUN 16, creatinine 0.98, glucose 89.  Hemoglobin 11.0, hematocrit 30.8, white blood cell count 6.3, platelets 202.  Cholesterol 160, triglycerides 38, HDL 92, LDL 60.  EKG shows normal sinus rhythm.  MRI/MRA of head as noted above.  ASSESSMENT:  This is an 77 year old Caucasian male who is on aspirin 81 mg daily who presents with a new acute right frontal parietal infarct. The patient's symptoms have all resolved at this time.  RECOMMENDATIONS: 1. Start the patient on Plavix 75 mg daily. 2. Continue with stroke workup. 3. Continue with glucose control of his diabetes.   I would recommend the patient follow up outpatient with Dr. Pearlean Brownie in approximately 3 months.   Office phone number [phone].  I have discussed all these findings with Dr. Thad Ranger and she is in agreement.     Felicie Morn, PA-C   ______________________________ Thana Farr, MD    DS/MEDQ  D:  09/10/2010  T:  09/11/2010  Job:  [id number]  Electronically Signed by Felicie Morn PA-C on 09/15/2010 11:52:36 AM Electronically Signed by Thana Farr MD on 09/24/2010 06:05:56 PM

## 2010-09-26 ENCOUNTER — Telehealth: Payer: Self-pay | Admitting: *Deleted

## 2010-09-26 NOTE — Telephone Encounter (Signed)
Pt continues to c/o dizziness and weakness. He feels that symptoms start in the am after taking plavix. Please advise.

## 2010-09-27 NOTE — Telephone Encounter (Signed)
OK to hold Plavix Try ASA 325 mg a day instead. See if sx resolve Thx

## 2010-09-29 NOTE — Telephone Encounter (Signed)
Patient informed. 

## 2010-10-14 NOTE — Assessment & Plan Note (Signed)
[facility]                           PRIMARY CARE OFFICE NOTE   SATCHEL, HEIDINGER                          MRN:          [id number]  DATE:11/24/2006                            DOB:          June 19, 1926    PROCEDURE:  Cryo surgery.   INDICATION:  Multiple actinic keratosis lesions.  The risks and benefits  were explained to the patient in detail and he agreed to proceed.  Six  lesions, two on the left ear, two on the left temple area, one on the  left cheek, and one on the right ear were treated with liquid nitrogen  in the usual fashion.  Tolerated well.   COMPLICATIONS:  None.     Georgina Quint. Plotnikov, MD  Electronically Signed    AVP/MedQ  DD: 11/24/2006  DT: 11/24/2006  Job #: [id number]

## 2010-10-17 NOTE — Assessment & Plan Note (Signed)
[facility]                             PRIMARY CARE OFFICE NOTE   Raymond Medina, Raymond Medina                          MRN:          [id number]  DATE:01/18/2006                            DOB:          Dec 09, 1926    PROCEDURE:  Cryosurgery.   INDICATIONS FOR PROCEDURE:  Actinic keratosis.   The risks and benefits were explained to the patient in detail.  AK lesions  on the left forehead, left ear, left arm, left forearm, and right arm (as  mapped on the picture) were treated with liquid nitrogen in the usual  fashion.  The patient tolerated this well.   COMPLICATIONS:  None.                                   Sonda Primes, MD   AP/MedQ  DD:  01/18/2006  DT:  01/18/2006  Job #:  [id number]

## 2010-10-31 ENCOUNTER — Encounter: Payer: Self-pay | Admitting: Gastroenterology

## 2010-11-03 ENCOUNTER — Encounter: Payer: Self-pay | Admitting: Gastroenterology

## 2010-12-08 ENCOUNTER — Telehealth: Payer: Self-pay | Admitting: *Deleted

## 2010-12-08 NOTE — Telephone Encounter (Signed)
Called pt's ins Re; PA on Ambien 10mg . Gave requested info and obtained PA approval  For 5/912-12/08/11. Pharmacy and pt informed.

## 2010-12-25 ENCOUNTER — Ambulatory Visit (INDEPENDENT_AMBULATORY_CARE_PROVIDER_SITE_OTHER): Payer: Medicare Other | Admitting: Internal Medicine

## 2010-12-25 ENCOUNTER — Encounter: Payer: Self-pay | Admitting: Internal Medicine

## 2010-12-25 DIAGNOSIS — G4700 Insomnia, unspecified: Secondary | ICD-10-CM

## 2010-12-25 DIAGNOSIS — I739 Peripheral vascular disease, unspecified: Secondary | ICD-10-CM

## 2010-12-25 DIAGNOSIS — J449 Chronic obstructive pulmonary disease, unspecified: Secondary | ICD-10-CM

## 2010-12-25 DIAGNOSIS — R7309 Other abnormal glucose: Secondary | ICD-10-CM

## 2010-12-25 DIAGNOSIS — I10 Essential (primary) hypertension: Secondary | ICD-10-CM

## 2010-12-25 DIAGNOSIS — L570 Actinic keratosis: Secondary | ICD-10-CM

## 2010-12-25 DIAGNOSIS — G459 Transient cerebral ischemic attack, unspecified: Secondary | ICD-10-CM

## 2010-12-25 NOTE — Assessment & Plan Note (Signed)
He lost wt 

## 2010-12-25 NOTE — Assessment & Plan Note (Signed)
Doing OK now 

## 2010-12-25 NOTE — Assessment & Plan Note (Signed)
On fish oil Declined statins

## 2010-12-25 NOTE — Assessment & Plan Note (Signed)
On rx 

## 2010-12-25 NOTE — Assessment & Plan Note (Signed)
Cont rx

## 2010-12-25 NOTE — Progress Notes (Signed)
  Subjective:    Patient ID: Raymond Medina, male    DOB: Sep 15, 1926, 77 y.o.   MRN: [id number]  HPI  The patient presents for a follow-up of  chronic hypertension, chronic PVD and dyslipidemia, type 2 pre- diabetes controlled with medicines F/u grief, wt loss     Review of Systems  Constitutional: Positive for unexpected weight change. Negative for appetite change and fatigue.  HENT: Negative for nosebleeds, congestion, sore throat, sneezing, trouble swallowing and neck pain.   Eyes: Negative for itching and visual disturbance.  Respiratory: Negative for cough.   Cardiovascular: Negative for chest pain, palpitations and leg swelling.  Gastrointestinal: Negative for nausea, diarrhea, blood in stool and abdominal distention.  Genitourinary: Negative for frequency and hematuria.  Musculoskeletal: Negative for back pain, joint swelling and gait problem.  Skin: Negative for rash.  Neurological: Negative for dizziness, tremors, speech difficulty and weakness.  Psychiatric/Behavioral: Positive for sleep disturbance. Negative for suicidal ideas, dysphoric mood and agitation. The patient is not nervous/anxious.    Wt Readings from Last 3 Encounters:  12/25/10 136 lb (61.689 kg)  09/22/10 139 lb (63.05 kg)  06/30/10 144 lb (65.318 kg)       Objective:   Physical Exam  Constitutional: He is oriented to person, place, and time. He appears well-developed.       Thin nad  HENT:  Mouth/Throat: Oropharynx is clear and moist.  Eyes: Conjunctivae are normal. Pupils are equal, round, and reactive to light.  Neck: Normal range of motion. No JVD present. No thyromegaly present.  Cardiovascular: Normal rate, regular rhythm, normal heart sounds and intact distal pulses.  Exam reveals no gallop and no friction rub.   No murmur heard. Pulmonary/Chest: Effort normal and breath sounds normal. No respiratory distress. He has no wheezes. He has no rales. He exhibits no tenderness.  Abdominal: Soft. Bowel  sounds are normal. He exhibits no distension and no mass. There is no tenderness. There is no rebound and no guarding.  Musculoskeletal: Normal range of motion. He exhibits no edema and no tenderness.  Lymphadenopathy:    He has no cervical adenopathy.  Neurological: He is alert and oriented to person, place, and time. He has normal reflexes. No cranial nerve deficit. He exhibits normal muscle tone. Coordination normal.  Skin: Skin is warm and dry. No rash noted.  Psychiatric: He has a normal mood and affect. His behavior is normal. Judgment and thought content normal.   AKs     Procedure Note :     Procedure : Cryosurgery   Indication:   Actinic keratosis(es)   Risks including unsuccessful procedure , bleeding, infection, bruising, scar, a need for a repeat  procedure and others were explained to the patient in detail as well as the benefits. Informed consent was obtained verbally.    2  lesion(s)  on  L ear, 1 on R ear 1 on L forehead  was/were treated with liquid nitrogen on a Q-tip in a usual fasion . Band-Aid was applied and antibiotic ointment was given for a later use.   Tolerated well. Complications none.   Postprocedure instructions :     Keep the wounds clean. You can wash them with liquid soap and water. Pat dry with gauze or a Kleenex tissue  Before applying antibiotic ointment and a Band-Aid.   You need to report immediately  if  any signs of infection develop.      Assessment & Plan:

## 2010-12-27 NOTE — Assessment & Plan Note (Signed)
WILL CRYO

## 2011-02-03 ENCOUNTER — Encounter: Payer: Self-pay | Admitting: Internal Medicine

## 2011-02-03 ENCOUNTER — Other Ambulatory Visit (INDEPENDENT_AMBULATORY_CARE_PROVIDER_SITE_OTHER): Payer: Medicare Other

## 2011-02-03 ENCOUNTER — Ambulatory Visit (INDEPENDENT_AMBULATORY_CARE_PROVIDER_SITE_OTHER): Payer: Medicare Other | Admitting: Internal Medicine

## 2011-02-03 VITALS — BP 136/70 | HR 80 | Temp 97.8°F | Resp 16 | Wt 136.0 lb

## 2011-02-03 DIAGNOSIS — R197 Diarrhea, unspecified: Secondary | ICD-10-CM | POA: Insufficient documentation

## 2011-02-03 DIAGNOSIS — R195 Other fecal abnormalities: Secondary | ICD-10-CM

## 2011-02-03 DIAGNOSIS — R5383 Other fatigue: Secondary | ICD-10-CM | POA: Insufficient documentation

## 2011-02-03 DIAGNOSIS — K219 Gastro-esophageal reflux disease without esophagitis: Secondary | ICD-10-CM

## 2011-02-03 DIAGNOSIS — R5381 Other malaise: Secondary | ICD-10-CM

## 2011-02-03 DIAGNOSIS — R634 Abnormal weight loss: Secondary | ICD-10-CM

## 2011-02-03 LAB — CBC WITH DIFFERENTIAL/PLATELET
Basophils Relative: 0.5 % (ref 0.0–3.0)
Eosinophils Relative: 2.3 % (ref 0.0–5.0)
Lymphocytes Relative: 19.2 % (ref 12.0–46.0)
MCV: 105.6 fl — ABNORMAL HIGH (ref 78.0–100.0)
Monocytes Relative: 8.4 % (ref 3.0–12.0)
Neutrophils Relative %: 69.6 % (ref 43.0–77.0)
RBC: 2.96 Mil/uL — ABNORMAL LOW (ref 4.22–5.81)
WBC: 8.7 10*3/uL (ref 4.5–10.5)

## 2011-02-03 MED ORDER — DIPHENOXYLATE-ATROPINE 2.5-0.025 MG PO TABS: 1.0000 | ORAL_TABLET | Freq: Four times a day (QID) | ORAL | Status: AC | PRN

## 2011-02-03 NOTE — Assessment & Plan Note (Signed)
Lomotil 

## 2011-02-03 NOTE — Assessment & Plan Note (Signed)
Check CBC 

## 2011-02-03 NOTE — Patient Instructions (Addendum)
Align 1 a day x 2 wks Hold aspirin x 1 wk Nexium 1 a day

## 2011-02-03 NOTE — Assessment & Plan Note (Signed)
Hem cards CBC

## 2011-02-03 NOTE — Assessment & Plan Note (Signed)
Nexium prn samples

## 2011-02-03 NOTE — Progress Notes (Signed)
  Subjective:    Patient ID: Raymond Medina, male    DOB: 1926-12-24, 77 y.o.   MRN: [id number]  HPI C/o relapsing diarrhea since 8/28 after having 5 beers. He had dark stool on Sat. He did not take Pepto-bismol. No abd pain. C/o fatigue. C/o wt loss.   Review of Systems  Constitutional: Positive for fatigue. Negative for appetite change and unexpected weight change.  HENT: Negative for nosebleeds, congestion, sore throat, sneezing, trouble swallowing and neck pain.   Eyes: Negative for itching and visual disturbance.  Respiratory: Negative for cough.   Cardiovascular: Negative for chest pain, palpitations and leg swelling.  Gastrointestinal: Positive for diarrhea (1/day). Negative for nausea, vomiting, abdominal pain, blood in stool, abdominal distention and anal bleeding.  Genitourinary: Negative for frequency and hematuria.  Musculoskeletal: Negative for back pain, joint swelling and gait problem.  Skin: Negative for pallor and rash.  Neurological: Negative for dizziness, tremors, speech difficulty and weakness.  Psychiatric/Behavioral: Negative for suicidal ideas, sleep disturbance, dysphoric mood and agitation. The patient is not nervous/anxious.    Wt Readings from Last 3 Encounters:  02/03/11 136 lb (61.689 kg)  12/25/10 136 lb (61.689 kg)  09/22/10 139 lb (63.05 kg)       Objective:   Physical Exam  Constitutional: He is oriented to person, place, and time. He appears well-developed.  HENT:  Mouth/Throat: Oropharynx is clear and moist.  Eyes: Conjunctivae are normal. Pupils are equal, round, and reactive to light.  Neck: Normal range of motion. No JVD present. No thyromegaly present.  Cardiovascular: Normal rate, regular rhythm, normal heart sounds and intact distal pulses.  Exam reveals no gallop and no friction rub.   No murmur heard. Pulmonary/Chest: Effort normal and breath sounds normal. No respiratory distress. He has no wheezes. He has no rales. He exhibits no  tenderness.  Abdominal: Soft. Bowel sounds are normal. He exhibits no distension and no mass. There is no tenderness. There is no rebound and no guarding.  Genitourinary: Rectum normal and prostate normal. Guaiac negative stool.  Musculoskeletal: Normal range of motion. He exhibits no edema and no tenderness.  Lymphadenopathy:    He has no cervical adenopathy.  Neurological: He is alert and oriented to person, place, and time. He has normal reflexes. No cranial nerve deficit. He exhibits normal muscle tone. Coordination normal.  Skin: Skin is warm and dry. No rash noted.  Psychiatric: He has a normal mood and affect. His behavior is normal. Judgment and thought content normal.          Assessment & Plan:

## 2011-02-03 NOTE — Assessment & Plan Note (Signed)
9/12-better

## 2011-02-06 ENCOUNTER — Telehealth: Payer: Self-pay | Admitting: Internal Medicine

## 2011-02-06 DIAGNOSIS — D649 Anemia, unspecified: Secondary | ICD-10-CM

## 2011-02-06 NOTE — Telephone Encounter (Signed)
Patient informed. 

## 2011-02-06 NOTE — Telephone Encounter (Signed)
Misty Stanley, please, inform patient that he has anemia. GI cons w/Dr Russella Dar - placed Thx

## 2011-02-08 ENCOUNTER — Encounter: Payer: Self-pay | Admitting: Internal Medicine

## 2011-02-09 ENCOUNTER — Telehealth: Payer: Self-pay | Admitting: *Deleted

## 2011-02-09 NOTE — Telephone Encounter (Signed)
Pharm faxed RF request -  Gen ambien 10 mg 1 qh #30

## 2011-02-10 MED ORDER — ZOLPIDEM TARTRATE 10 MG PO TABS: 10.0000 mg | ORAL_TABLET | Freq: Every evening | ORAL | Status: DC | PRN

## 2011-02-10 NOTE — Telephone Encounter (Signed)
Called in.

## 2011-02-10 NOTE — Telephone Encounter (Signed)
OK to fill this prescription with additional refills x5 Thank you!  

## 2011-02-26 ENCOUNTER — Other Ambulatory Visit: Payer: Medicare Other

## 2011-02-26 ENCOUNTER — Other Ambulatory Visit: Payer: Self-pay | Admitting: Internal Medicine

## 2011-02-26 DIAGNOSIS — Z1289 Encounter for screening for malignant neoplasm of other sites: Secondary | ICD-10-CM

## 2011-02-26 LAB — HEMOCCULT SLIDES (X 3 CARDS)
Fecal Occult Blood: NEGATIVE
OCCULT 1: NEGATIVE
OCCULT 2: NEGATIVE
OCCULT 3: NEGATIVE
OCCULT 4: NEGATIVE
OCCULT 5: NEGATIVE

## 2011-03-06 ENCOUNTER — Other Ambulatory Visit (INDEPENDENT_AMBULATORY_CARE_PROVIDER_SITE_OTHER): Payer: Medicare Other

## 2011-03-06 ENCOUNTER — Ambulatory Visit (INDEPENDENT_AMBULATORY_CARE_PROVIDER_SITE_OTHER): Payer: Medicare Other | Admitting: Internal Medicine

## 2011-03-06 ENCOUNTER — Encounter: Payer: Self-pay | Admitting: Internal Medicine

## 2011-03-06 VITALS — BP 130/58 | HR 84 | Temp 97.3°F | Resp 16 | Wt 136.0 lb

## 2011-03-06 DIAGNOSIS — I951 Orthostatic hypotension: Secondary | ICD-10-CM

## 2011-03-06 DIAGNOSIS — G459 Transient cerebral ischemic attack, unspecified: Secondary | ICD-10-CM

## 2011-03-06 DIAGNOSIS — D649 Anemia, unspecified: Secondary | ICD-10-CM

## 2011-03-06 DIAGNOSIS — R195 Other fecal abnormalities: Secondary | ICD-10-CM

## 2011-03-06 DIAGNOSIS — R634 Abnormal weight loss: Secondary | ICD-10-CM

## 2011-03-06 DIAGNOSIS — I739 Peripheral vascular disease, unspecified: Secondary | ICD-10-CM

## 2011-03-06 DIAGNOSIS — G4700 Insomnia, unspecified: Secondary | ICD-10-CM

## 2011-03-06 DIAGNOSIS — R197 Diarrhea, unspecified: Secondary | ICD-10-CM

## 2011-03-06 LAB — CBC WITH DIFFERENTIAL/PLATELET
Basophils Absolute: 0.1 10*3/uL (ref 0.0–0.1)
Eosinophils Absolute: 0.2 10*3/uL (ref 0.0–0.7)
Lymphocytes Relative: 14 % (ref 12.0–46.0)
MCHC: 34.2 g/dL (ref 30.0–36.0)
Monocytes Relative: 5 % (ref 3.0–12.0)
Platelets: 244 10*3/uL (ref 150.0–400.0)
RDW: 12.9 % (ref 11.5–14.6)

## 2011-03-06 LAB — CORTISOL: Cortisol, Plasma: 9.9 ug/dL

## 2011-03-06 NOTE — Assessment & Plan Note (Signed)
ASA intolerant

## 2011-03-06 NOTE — Assessment & Plan Note (Signed)
Continue with current prescription therapy as reflected on the Med list.  

## 2011-03-06 NOTE — Assessment & Plan Note (Signed)
Resolved

## 2011-03-06 NOTE — Assessment & Plan Note (Signed)
resolved 

## 2011-03-06 NOTE — Assessment & Plan Note (Signed)
Repeat CBC and test cortisol

## 2011-03-06 NOTE — Progress Notes (Signed)
Subjective:    Patient ID: Raymond Medina, male    DOB: 1927/04/25, 77 y.o.   MRN: [id number]  HPI F/u diarrhea - stopped a couple wks ago; wt loss - better; anemia. C/o lightheadedness. No c/o fatigue  Review of Systems  Constitutional: Negative for appetite change, fatigue and unexpected weight change.  HENT: Negative for nosebleeds, congestion, sore throat, sneezing, trouble swallowing and neck pain.   Eyes: Negative for itching and visual disturbance.  Respiratory: Negative for cough.   Cardiovascular: Negative for chest pain, palpitations and leg swelling.  Gastrointestinal: Negative for nausea, diarrhea, blood in stool and abdominal distention.  Genitourinary: Negative for frequency and hematuria.  Musculoskeletal: Negative for back pain, joint swelling and gait problem.  Skin: Negative for rash.  Neurological: Positive for dizziness and light-headedness. Negative for tremors, speech difficulty and weakness.  Psychiatric/Behavioral: Negative for sleep disturbance, dysphoric mood and agitation. The patient is not nervous/anxious.    Wt Readings from Last 3 Encounters:  03/06/11 136 lb (61.689 kg)  02/03/11 136 lb (61.689 kg)  12/25/10 136 lb (61.689 kg)       Objective:   Physical Exam  Constitutional: He is oriented to person, place, and time. He appears well-developed.  HENT:  Mouth/Throat: Oropharynx is clear and moist.  Eyes: Conjunctivae are normal. Pupils are equal, round, and reactive to light.  Neck: Normal range of motion. No JVD present. No thyromegaly present.  Cardiovascular: Normal rate, regular rhythm, normal heart sounds and intact distal pulses.  Exam reveals no gallop and no friction rub.   No murmur heard. Pulmonary/Chest: Effort normal and breath sounds normal. No respiratory distress. He has no wheezes. He has no rales. He exhibits no tenderness.  Abdominal: Soft. Bowel sounds are normal. He exhibits no distension and no mass. There is no tenderness. There  is no rebound and no guarding.  Musculoskeletal: Normal range of motion. He exhibits no edema and no tenderness.  Lymphadenopathy:    He has no cervical adenopathy.  Neurological: He is alert and oriented to person, place, and time. He has normal reflexes. No cranial nerve deficit. He exhibits normal muscle tone. Coordination normal.  Skin: Skin is warm and dry. No rash noted.  Psychiatric: He has a normal mood and affect. His behavior is normal. Judgment and thought content normal.      Hem cards neg x 5 Lab Results  Component Value Date   WBC 8.7 02/03/2011   HGB 10.4* 02/03/2011   HCT 31.3* 02/03/2011   PLT 259.0 02/03/2011   GLUCOSE 89 09/10/2010   CHOL  Value: 160        ATP III CLASSIFICATION:  <200     mg/dL   Desirable  409-811  mg/dL   Borderline High  >=914    mg/dL   High        7/82/9562   TRIG 38 09/10/2010   HDL 92 09/10/2010   LDLCALC  Value: 60        Total Cholesterol/HDL:CHD Risk Coronary Heart Disease Risk Table                     Men   Women  1/2 Average Risk   3.4   3.3  Average Risk       5.0   4.4  2 X Average Risk   9.6   7.1  3 X Average Risk  23.4   11.0        Use the calculated Patient Ratio  above and the CHD Risk Table to determine the patient's CHD Risk.        ATP III CLASSIFICATION (LDL):  <100     mg/dL   Optimal  161-096  mg/dL   Near or Above                    Optimal  130-159  mg/dL   Borderline  045-409  mg/dL   High  >811     mg/dL   Very High 02/13/7828   ALT 40 09/09/2010   AST 35 09/09/2010   NA 137 09/10/2010   K 4.1 09/10/2010   CL 106 09/10/2010   CREATININE 0.98 09/10/2010   BUN 16 09/10/2010   CO2 25 09/10/2010   TSH 1.01 06/23/2010   PSA 0.01* 06/23/2010   INR 1.01 09/09/2010   HGBA1C  Value: 5.6 (NOTE)                                                                       According to the ADA Clinical Practice Recommendations for 2011, when HbA1c is used as a screening test:   >=6.5%   Diagnostic of Diabetes Mellitus           (if abnormal result  is  confirmed)  5.7-6.4%   Increased risk of developing Diabetes Mellitus  References:Diagnosis and Classification of Diabetes Mellitus,Diabetes Care,2011,34(Suppl 1):S62-S69 and Standards of Medical Care in         Diabetes - 2011,Diabetes Care,2011,34  (Suppl 1):S11-S61. 09/10/2010       Assessment & Plan:

## 2011-03-10 ENCOUNTER — Ambulatory Visit (INDEPENDENT_AMBULATORY_CARE_PROVIDER_SITE_OTHER): Payer: Medicare Other | Admitting: Gastroenterology

## 2011-03-10 ENCOUNTER — Encounter: Payer: Self-pay | Admitting: Gastroenterology

## 2011-03-10 DIAGNOSIS — D649 Anemia, unspecified: Secondary | ICD-10-CM

## 2011-03-10 DIAGNOSIS — Z860100 Personal history of colon polyps, unspecified: Secondary | ICD-10-CM

## 2011-03-10 DIAGNOSIS — R634 Abnormal weight loss: Secondary | ICD-10-CM

## 2011-03-10 MED ORDER — PEG-KCL-NACL-NASULF-NA ASC-C 100 G PO SOLR: 1.0000 | Freq: Once | ORAL | Status: DC

## 2011-03-10 NOTE — Patient Instructions (Signed)
You have been scheduled for a Upper Endoscopy/ Colonoscopy. See separate instructions.  Pick up your prep kit from the pharmacy.  cc: Sonda Primes, MD

## 2011-03-10 NOTE — Progress Notes (Signed)
History of Present Illness: This is an 77 year old male presenting today with a two-day history of black stools and a one-week history of diarrhea. All symptoms have resolved. Stool Hemoccults obtained by Dr. Posey Rea were negative. He was noted to have a macrocytic anemia with a hemoglobin of 11, MCV of 102. He has no gastrointestinal complaints today except for intermittent constipation controlled with MiraLax. 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   Current Medications, Allergies, Past Medical History, Past Surgical History, Family History and Social History were reviewed in [employer] record.  Physical Exam: General: Well developed , well nourished, no acute distress Head: Normocephalic and atraumatic Eyes:  sclerae anicteric, EOMI Ears: Normal auditory acuity Mouth: No deformity or lesions Lungs: Clear throughout to auscultation Heart: Regular rate and rhythm; no murmurs, rubs or bruits Abdomen: Soft, non tender and non distended. No masses, hepatosplenomegaly or hernias noted. Normal Bowel sounds Rectal: Deferred to colonoscopy Musculoskeletal: Symmetrical with no gross deformities  Pulses:  Normal pulses noted Extremities: No clubbing, cyanosis, edema or deformities noted Neurological: Alert oriented x 4, grossly nonfocal Psychological:  Alert and cooperative. Normal mood and affect  Assessment and Recommendations:  1. History of black stools, possible melena.  Aspirin was held. Rule out upper gastrointestinal tract ulcers, neoplasms, AVMs. The risks, benefits, and alternatives to endoscopy with possible biopsy and possible dilation were discussed with the patient and they consent to proceed.   2. 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 He is overdue for one year  colonoscopy. The risks, benefits, and alternatives to colonoscopy with possible biopsy and possible polypectomy were discussed with the patient and they consent to proceed.   3.Self-limited diarrhea, resolved.   4. Mild macrocytic anemia.   5. Constipation. Continue MiraLax as needed.

## 2011-03-30 ENCOUNTER — Telehealth: Payer: Self-pay | Admitting: Gastroenterology

## 2011-03-30 NOTE — Telephone Encounter (Signed)
Patient advised that he may continue his Miralax PRN this week

## 2011-04-02 ENCOUNTER — Ambulatory Visit: Payer: Medicare Other | Admitting: Internal Medicine

## 2011-04-03 ENCOUNTER — Ambulatory Visit (AMBULATORY_SURGERY_CENTER): Payer: Medicare Other | Admitting: Gastroenterology

## 2011-04-03 ENCOUNTER — Encounter: Payer: Self-pay | Admitting: Gastroenterology

## 2011-04-03 DIAGNOSIS — K921 Melena: Secondary | ICD-10-CM

## 2011-04-03 DIAGNOSIS — K2210 Ulcer of esophagus without bleeding: Secondary | ICD-10-CM

## 2011-04-03 DIAGNOSIS — K219 Gastro-esophageal reflux disease without esophagitis: Secondary | ICD-10-CM

## 2011-04-03 DIAGNOSIS — Z1211 Encounter for screening for malignant neoplasm of colon: Secondary | ICD-10-CM

## 2011-04-03 DIAGNOSIS — Z86010 Personal history of colonic polyps: Secondary | ICD-10-CM

## 2011-04-03 DIAGNOSIS — D649 Anemia, unspecified: Secondary | ICD-10-CM

## 2011-04-03 DIAGNOSIS — R634 Abnormal weight loss: Secondary | ICD-10-CM

## 2011-04-03 MED ORDER — SODIUM CHLORIDE 0.9 % IV SOLN: 500.0000 mL | INTRAVENOUS | Status: DC

## 2011-04-03 MED ORDER — OMEPRAZOLE 40 MG PO CPDR: 40.0000 mg | DELAYED_RELEASE_CAPSULE | ORAL | Status: DC

## 2011-04-03 NOTE — Patient Instructions (Addendum)
Please refer to your blue and neon green sheets for instructions regarding diet and activity for the rest of today.  You may resume your medications as you would normally take them.   You will receive a letter in the mail in about two weeks regarding the results of the biopsies taken today.        Esophagitis Esophagitis (heartburn) is a painful, burning sensation in the chest. It may feel worse in certain positions, such as lying down or bending over. It is caused by stomach acid backing up into the tube that carries food from the mouth down to the stomach (lower esophagus). TREATMENT  There are a number of non-prescription medicines used to treat heartburn, including:  Antacids.   Acid reducers (also called H-2 blockers).   Proton-pump inhibitors.  HOME CARE INSTRUCTIONS   Raise the head of your bead by putting blocks under the legs.   Eat 2-3 hours before going to bed.   Stop smoking.   Try to reach and maintain a healthy weight.   Do not eat just a few very large meals. Instead, eat many smaller meals throughout the day.   Try to identify foods and beverages that make your symptoms worse, and avoid these.   Avoid tight clothing.   Do not exercise right after eating.  SEEK IMMEDIATE MEDICAL CARE IF:  You have severe chest pain that goes down your arm, or into your jaw or neck.   You feel sweaty, dizzy, or lightheaded.   You are short of breath.   You throw up (vomit) blood.   You have difficulty or pain with swallowing.   You have bloody or black, tarry stools.   You have bouts of heartburn more than three times a week for more than two weeks.  Document Released: 06/25/2004 Document Revised: 12/01/2010 Document Reviewed: 09/26/2008 [facility] Patient Information 2012 [place], [place].  Hemorrhoids Hemorrhoids are veins in the rectum that get big. These veins can get blocked. Blocked veins become puffy (swollen) and painful. HOME CARE  Eat more fiber.    Drink enough fluid to keep your pee (urine) clear or pale yellow.   Exercise often.   Avoid straining to poop (bowel movement).   Keep the butt area dry and clean.   Only take medicine as told by your doctor.  If your hemorrhoids are puffy and painful:  Take a warm bath for 20 to 30 minutes. Do this 3 to 4 times a day.   Place ice packs on the area. Use the ice packs between the baths.   Put ice in a plastic bag.   Place a towel between your skin and the bag.   Leave the ice on for 15 to 20 minutes, 3 to 4 times a day.   Do not use a donut-shaped pillow. Do not sit on the toilet for a long time.   Go to the bathroom when your body has the urge to poop. This is so you do not strain as much to poop.  GET HELP RIGHT AWAY IF:   You have increasing pain that is not controlled with medicine.   You have uncontrolled bleeding.   You cannot poop.   You have pain or puffiness outside the area of the hemorrhoids.   You have chills.   You have a temperature by mouth above 102 F (38.9 C), not controlled by medicine.  MAKE SURE YOU:   Understand these instructions.   Will watch your condition.   Will  get help right away if you are not doing well or get worse.  Document Released: 02/25/2008 Document Revised: 01/28/2011 Document Reviewed: 02/25/2008 [facility] Patient Information 2012 [place], [place].  Diverticulosis Diverticulosis is a common condition that develops when small pouches (diverticula) form in the wall of the colon. The risk of diverticulosis increases with age. It happens more often in people who eat a low-fiber diet. Most individuals with diverticulosis have no symptoms. Those individuals with symptoms usually experience abdominal pain, constipation, or loose stools (diarrhea). HOME CARE INSTRUCTIONS   Increase the amount of fiber in your diet as directed by your caregiver or dietician. This may reduce symptoms of diverticulosis.   Your caregiver may recommend  taking a dietary fiber supplement.   Drink at least 6 to 8 glasses of water each day to prevent constipation.   Try not to strain when you have a bowel movement.   Your caregiver may recommend avoiding nuts and seeds to prevent complications, although this is still an uncertain benefit.   Only take over-the-counter or prescription medicines for pain, discomfort, or fever as directed by your caregiver.  FOODS WITH HIGH FIBER CONTENT INCLUDE:  Fruits. Apple, peach, pear, tangerine, raisins, prunes.   Vegetables. Brussels sprouts, asparagus, broccoli, cabbage, carrot, cauliflower, romaine lettuce, spinach, summer squash, tomato, winter squash, zucchini.   Starchy Vegetables. Baked beans, kidney beans, lima beans, split peas, lentils, potatoes (with skin).   Grains. Whole wheat bread, brown rice, bran flake cereal, plain oatmeal, white rice, shredded wheat, bran muffins.  SEEK IMMEDIATE MEDICAL CARE IF:   You develop increasing pain or severe bloating.   You have an oral temperature above 102 F (38.9 C), not controlled by medicine.   You develop vomiting or bowel movements that are bloody or black.  Document Released: 02/13/2004 Document Revised: 01/28/2011 Document Reviewed: 10/16/2009 [facility] Patient Information 2012 [place], [place].

## 2011-04-03 NOTE — Progress Notes (Signed)
Pt tolerated the colonoscopy very well. Maw  Pt tolerated the EGD exam very well. maw

## 2011-04-06 ENCOUNTER — Telehealth: Payer: Self-pay | Admitting: *Deleted

## 2011-04-06 NOTE — Telephone Encounter (Signed)
Follow up Call- Patient questions:  Do you have a fever, pain , or abdominal swelling? no Pain Score  0 *  Have you tolerated food without any problems? yes  Have you been able to return to your normal activities? yes  Do you have any questions about your discharge instructions: Diet   no Medications  no Follow up visit  no  Do you have questions or concerns about your Care? yes  Actions: * If pain score is 4 or above: No action needed, pain <4. Pt inquired if it would be okay to have a couple of beers now. Informed that our concern was really about the sedation and intensified effects with alcohol on day of procedure. Pt. Inquired about specimens sent for pathology, explained about irregular z-line and Barretts. Also inquired if he should resume Miralax. Discussed that it takes a couple of days to get the gut working again after colonoscopy but if he felt he needed to take, it would be fine.

## 2011-04-07 ENCOUNTER — Encounter: Payer: Self-pay | Admitting: Gastroenterology

## 2011-05-20 ENCOUNTER — Other Ambulatory Visit: Payer: Self-pay | Admitting: Internal Medicine

## 2011-05-27 ENCOUNTER — Encounter: Payer: Self-pay | Admitting: Internal Medicine

## 2011-05-27 ENCOUNTER — Ambulatory Visit (INDEPENDENT_AMBULATORY_CARE_PROVIDER_SITE_OTHER): Payer: Medicare Other | Admitting: Internal Medicine

## 2011-05-27 DIAGNOSIS — K5909 Other constipation: Secondary | ICD-10-CM

## 2011-05-27 DIAGNOSIS — I10 Essential (primary) hypertension: Secondary | ICD-10-CM

## 2011-05-27 DIAGNOSIS — J449 Chronic obstructive pulmonary disease, unspecified: Secondary | ICD-10-CM

## 2011-05-27 DIAGNOSIS — L570 Actinic keratosis: Secondary | ICD-10-CM

## 2011-05-27 DIAGNOSIS — M545 Low back pain: Secondary | ICD-10-CM

## 2011-05-27 MED ORDER — ZOLPIDEM TARTRATE 10 MG PO TABS: 10.0000 mg | ORAL_TABLET | Freq: Every evening | ORAL | Status: DC | PRN

## 2011-05-27 NOTE — Assessment & Plan Note (Signed)
Watching  

## 2011-05-27 NOTE — Patient Instructions (Signed)
   Postprocedure instructions :     Keep the wounds clean. You can wash them with liquid soap and water. Pat dry with gauze or a Kleenex tissue  Before applying antibiotic ointment and a Band-Aid.   You need to report immediately  if  any signs of infection develop.    

## 2011-05-27 NOTE — Progress Notes (Signed)
  Subjective:    Patient ID: Raymond Medina, male    DOB: [birth year], 77 y.o.   MRN: [id number]  HPI The patient presents for a follow-up of  chronic hypertension, chronic COPD, OA controlled with medicines. C/o skin growth on R temple     Review of Systems  Constitutional: Negative for appetite change, fatigue and unexpected weight change.  HENT: Negative for nosebleeds, congestion, sore throat, sneezing, trouble swallowing and neck pain.   Eyes: Negative for itching and visual disturbance.  Respiratory: Negative for cough.   Cardiovascular: Negative for chest pain, palpitations and leg swelling.  Gastrointestinal: Negative for nausea, diarrhea, blood in stool and abdominal distention.  Genitourinary: Negative for frequency and hematuria.  Musculoskeletal: Negative for back pain, joint swelling and gait problem.  Skin: Negative for rash.  Neurological: Negative for dizziness, tremors, speech difficulty and weakness.  Psychiatric/Behavioral: Positive for sleep disturbance. Negative for suicidal ideas, dysphoric mood and agitation. The patient is not nervous/anxious.        Objective:   Physical Exam  Constitutional: He is oriented to person, place, and time. He appears well-developed.  HENT:  Mouth/Throat: Oropharynx is clear and moist.  Eyes: Conjunctivae are normal. Pupils are equal, round, and reactive to light.  Neck: Normal range of motion. No JVD present. No thyromegaly present.  Cardiovascular: Normal rate, regular rhythm, normal heart sounds and intact distal pulses.  Exam reveals no gallop and no friction rub.   No murmur heard. Pulmonary/Chest: Effort normal and breath sounds normal. No respiratory distress. He has no wheezes. He has no rales. He exhibits no tenderness.  Abdominal: Soft. Bowel sounds are normal. He exhibits no distension and no mass. There is no tenderness. There is no rebound and no guarding.  Musculoskeletal: Normal range of motion. He exhibits no edema and  no tenderness.  Lymphadenopathy:    He has no cervical adenopathy.  Neurological: He is alert and oriented to person, place, and time. He has normal reflexes. No cranial nerve deficit. He exhibits normal muscle tone. Coordination normal.  Skin: Skin is warm and dry. No rash noted.  Psychiatric: He has a normal mood and affect. His behavior is normal. Judgment and thought content normal.  AK R temple  Wt Readings from Last 3 Encounters:  05/27/11 145 lb (65.772 kg)  04/03/11 139 lb (63.05 kg)  03/10/11 139 lb 6.4 oz (63.231 kg)    Procedure Note :     Procedure : Cryosurgery   Indication:    Actinic keratosis(es) x1   Risks including unsuccessful procedure , bleeding, infection, bruising, scar, a need for a repeat  procedure and others were explained to the patient in detail as well as the benefits. Informed consent was obtained verbally.    1 lesion(s)  on  R temple  was/were treated with liquid nitrogen on a Q-tip in a usual fasion . Band-Aid was applied and antibiotic ointment was given for a later use.   Tolerated well. Complications none.   Postprocedure instructions :     Keep the wounds clean. You can wash them with liquid soap and water. Pat dry with gauze or a Kleenex tissue  Before applying antibiotic ointment and a Band-Aid.   You need to report immediately  if  any signs of infection develop.           Assessment & Plan:

## 2011-05-27 NOTE — Assessment & Plan Note (Signed)
Better  

## 2011-05-27 NOTE — Assessment & Plan Note (Signed)
Continue with current prescription therapy as reflected on the Med list.  

## 2011-08-14 ENCOUNTER — Encounter: Payer: Self-pay | Admitting: Internal Medicine

## 2011-08-14 ENCOUNTER — Telehealth: Payer: Self-pay | Admitting: *Deleted

## 2011-08-14 ENCOUNTER — Ambulatory Visit (INDEPENDENT_AMBULATORY_CARE_PROVIDER_SITE_OTHER): Payer: Medicare Other | Admitting: Internal Medicine

## 2011-08-14 VITALS — BP 120/60 | HR 84 | Temp 97.0°F | Resp 16 | Wt 144.0 lb

## 2011-08-14 DIAGNOSIS — J449 Chronic obstructive pulmonary disease, unspecified: Secondary | ICD-10-CM

## 2011-08-14 DIAGNOSIS — G459 Transient cerebral ischemic attack, unspecified: Secondary | ICD-10-CM

## 2011-08-14 DIAGNOSIS — I6350 Cerebral infarction due to unspecified occlusion or stenosis of unspecified cerebral artery: Secondary | ICD-10-CM

## 2011-08-14 DIAGNOSIS — J069 Acute upper respiratory infection, unspecified: Secondary | ICD-10-CM | POA: Insufficient documentation

## 2011-08-14 DIAGNOSIS — I639 Cerebral infarction, unspecified: Secondary | ICD-10-CM

## 2011-08-14 DIAGNOSIS — K219 Gastro-esophageal reflux disease without esophagitis: Secondary | ICD-10-CM

## 2011-08-14 DIAGNOSIS — I10 Essential (primary) hypertension: Secondary | ICD-10-CM

## 2011-08-14 MED ORDER — AMOXICILLIN 500 MG PO CAPS: 1000.0000 mg | ORAL_CAPSULE | Freq: Two times a day (BID) | ORAL | Status: AC

## 2011-08-14 MED ORDER — ASPIRIN 81 MG PO CHEW: 81.0000 mg | CHEWABLE_TABLET | Freq: Every day | ORAL | Status: DC

## 2011-08-14 NOTE — Assessment & Plan Note (Signed)
Change to baby ASA

## 2011-08-14 NOTE — Assessment & Plan Note (Signed)
On asa

## 2011-08-14 NOTE — Progress Notes (Signed)
  Subjective:    Patient ID: Raymond Medina, male    DOB: 1926-11-21, 78 y.o.   MRN: [id number]  Cough Associated symptoms include postnasal drip. Pertinent negatives include no chest pain, rash or sore throat.   C/o URI sx's x 4 d - green d/c, cough The patient presents for a follow-up of  chronic hypertension, chronic COPD, OA controlled with medicines.    BP Readings from Last 3 Encounters:  08/14/11 120/60  05/27/11 140/7  04/03/11 155/79     Review of Systems  Constitutional: Negative for appetite change, fatigue and unexpected weight change.  HENT: Positive for postnasal drip and sinus pressure. Negative for nosebleeds, congestion, sore throat, sneezing, trouble swallowing and neck pain.   Eyes: Negative for itching and visual disturbance.  Respiratory: Positive for cough.   Cardiovascular: Negative for chest pain, palpitations and leg swelling.  Gastrointestinal: Negative for nausea, diarrhea, blood in stool and abdominal distention.  Genitourinary: Negative for frequency and hematuria.  Musculoskeletal: Negative for back pain, joint swelling and gait problem.  Skin: Negative for rash.  Neurological: Negative for dizziness, tremors, speech difficulty and weakness.  Psychiatric/Behavioral: Positive for sleep disturbance. Negative for suicidal ideas, dysphoric mood and agitation. The patient is not nervous/anxious.        Objective:   Physical Exam  Constitutional: He is oriented to person, place, and time. He appears well-developed.  HENT:  Mouth/Throat: Oropharynx is clear and moist.  Eyes: Conjunctivae are normal. Pupils are equal, round, and reactive to light.  Neck: Normal range of motion. No JVD present. No thyromegaly present.  Cardiovascular: Normal rate, regular rhythm, normal heart sounds and intact distal pulses.  Exam reveals no gallop and no friction rub.   No murmur heard. Pulmonary/Chest: Effort normal and breath sounds normal. No respiratory distress. He has  no wheezes. He has no rales. He exhibits no tenderness.  Abdominal: Soft. Bowel sounds are normal. He exhibits no distension and no mass. There is no tenderness. There is no rebound and no guarding.  Musculoskeletal: Normal range of motion. He exhibits no edema and no tenderness.  Lymphadenopathy:    He has no cervical adenopathy.  Neurological: He is alert and oriented to person, place, and time. He has normal reflexes. No cranial nerve deficit. He exhibits normal muscle tone. Coordination normal.  Skin: Skin is warm and dry. No rash noted.  Psychiatric: He has a normal mood and affect. His behavior is normal. Judgment and thought content normal.    Wt Readings from Last 3 Encounters:  08/14/11 144 lb (65.318 kg)  05/27/11 145 lb (65.772 kg)  04/03/11 139 lb (63.05 kg)             Assessment & Plan:

## 2011-08-14 NOTE — Assessment & Plan Note (Signed)
Continue with current prescription therapy as reflected on the Med list.  

## 2011-08-14 NOTE — Telephone Encounter (Signed)
Pt left vm requesting to know if he can stop Prilosec and start taking a baby ASA. Please advise.

## 2011-08-14 NOTE — Patient Instructions (Signed)
OK to hold Prilosec

## 2011-08-14 NOTE — Assessment & Plan Note (Signed)
OK to hold Prilosec - he thinks his stomach was upset due to it

## 2011-08-14 NOTE — Assessment & Plan Note (Signed)
Amoxicillin x10d 

## 2011-08-16 NOTE — Telephone Encounter (Signed)
No need to sop prilosec Thx

## 2011-08-17 NOTE — Telephone Encounter (Signed)
What about ASA?

## 2011-08-18 NOTE — Telephone Encounter (Signed)
Take asa too Thx

## 2011-08-20 NOTE — Telephone Encounter (Signed)
Notified pt. 

## 2011-09-03 ENCOUNTER — Ambulatory Visit: Payer: Medicare Other | Admitting: Internal Medicine

## 2011-10-23 ENCOUNTER — Encounter: Payer: Self-pay | Admitting: Internal Medicine

## 2011-10-23 ENCOUNTER — Ambulatory Visit (INDEPENDENT_AMBULATORY_CARE_PROVIDER_SITE_OTHER): Payer: Medicare Other | Admitting: Internal Medicine

## 2011-10-23 VITALS — BP 150/80 | HR 77 | Temp 97.4°F | Resp 16 | Wt 142.0 lb

## 2011-10-23 DIAGNOSIS — L570 Actinic keratosis: Secondary | ICD-10-CM

## 2011-10-23 DIAGNOSIS — R634 Abnormal weight loss: Secondary | ICD-10-CM

## 2011-10-23 DIAGNOSIS — R209 Unspecified disturbances of skin sensation: Secondary | ICD-10-CM

## 2011-10-23 DIAGNOSIS — R202 Paresthesia of skin: Secondary | ICD-10-CM

## 2011-10-23 DIAGNOSIS — G5790 Unspecified mononeuropathy of unspecified lower limb: Secondary | ICD-10-CM

## 2011-10-23 DIAGNOSIS — G5793 Unspecified mononeuropathy of bilateral lower limbs: Secondary | ICD-10-CM

## 2011-10-23 DIAGNOSIS — I10 Essential (primary) hypertension: Secondary | ICD-10-CM

## 2011-10-23 DIAGNOSIS — M545 Low back pain, unspecified: Secondary | ICD-10-CM

## 2011-10-23 MED ORDER — GABAPENTIN 100 MG PO CAPS: ORAL_CAPSULE | ORAL | Status: DC

## 2011-10-23 NOTE — Assessment & Plan Note (Signed)
Cryo - see note

## 2011-10-23 NOTE — Assessment & Plan Note (Signed)
Labs  Trial of gabapentin

## 2011-10-23 NOTE — Progress Notes (Signed)
Patient ID: CASIMIRO LIENHARD, male   DOB: 12-03-1926, 78 y.o.   MRN: [id number]  Subjective:    Patient ID: MENG WINTERTON, male    DOB: 1926/10/06, 78 y.o.   MRN: [id number]  HPI The patient presents for a follow-up of  chronic hypertension, chronic COPD, OA controlled with medicines. C/o skin growth on R temple - re grew. C/o B feet burning pains - for "a while"...     Review of Systems  Constitutional: Negative for appetite change, fatigue and unexpected weight change.  HENT: Negative for nosebleeds, congestion, sore throat, sneezing, trouble swallowing and neck pain.   Eyes: Negative for itching and visual disturbance.  Respiratory: Negative for cough.   Cardiovascular: Negative for chest pain, palpitations and leg swelling.  Gastrointestinal: Negative for nausea, diarrhea, blood in stool and abdominal distention.  Genitourinary: Negative for frequency and hematuria.  Musculoskeletal: Negative for back pain, joint swelling and gait problem.  Skin: Negative for rash.  Neurological: Negative for dizziness, tremors, speech difficulty and weakness.  Psychiatric/Behavioral: Positive for sleep disturbance. Negative for suicidal ideas, dysphoric mood and agitation. The patient is not nervous/anxious.        Objective:   Physical Exam  Constitutional: He is oriented to person, place, and time. He appears well-developed.  HENT:  Mouth/Throat: Oropharynx is clear and moist.  Eyes: Conjunctivae are normal. Pupils are equal, round, and reactive to light.  Neck: Normal range of motion. No JVD present. No thyromegaly present.  Cardiovascular: Normal rate, regular rhythm, normal heart sounds and intact distal pulses.  Exam reveals no gallop and no friction rub.   No murmur heard. Pulmonary/Chest: Effort normal and breath sounds normal. No respiratory distress. He has no wheezes. He has no rales. He exhibits no tenderness.  Abdominal: Soft. Bowel sounds are normal. He exhibits no distension and no mass.  There is no tenderness. There is no rebound and no guarding.  Musculoskeletal: Normal range of motion. He exhibits no edema and no tenderness.  Lymphadenopathy:    He has no cervical adenopathy.  Neurological: He is alert and oriented to person, place, and time. He has normal reflexes. No cranial nerve deficit. He exhibits normal muscle tone. Coordination normal.  Skin: Skin is warm and dry. No rash noted.  Psychiatric: He has a normal mood and affect. His behavior is normal. Judgment and thought content normal.  AK R temple - needs to be treated again  Wt Readings from Last 3 Encounters:  10/23/11 142 lb (64.411 kg)  08/14/11 144 lb (65.318 kg)  05/27/11 145 lb (65.772 kg)    Procedure Note :     Procedure : Cryosurgery   Indication:    Actinic keratosis(es) x1   Risks including unsuccessful procedure , bleeding, infection, bruising, scar, a need for a repeat  procedure and others were explained to the patient in detail as well as the benefits. Informed consent was obtained verbally.    1 lesion(s)  on  R temple  Was/were re treated with liquid nitrogen on a Q-tip in a usual fasion . Band-Aid was applied and antibiotic ointment was given for a later use.   Tolerated well. Complications none.   Postprocedure instructions :     Keep the wounds clean. You can wash them with liquid soap and water. Pat dry with gauze or a Kleenex tissue  Before applying antibiotic ointment and a Band-Aid.   You need to report immediately  if  any signs of infection develop.  Assessment & Plan:

## 2011-10-23 NOTE — Assessment & Plan Note (Signed)
Continue with current prescription therapy as reflected on the Med list.  

## 2011-10-23 NOTE — Assessment & Plan Note (Signed)
Watching  

## 2011-10-27 ENCOUNTER — Other Ambulatory Visit (INDEPENDENT_AMBULATORY_CARE_PROVIDER_SITE_OTHER): Payer: Medicare Other

## 2011-10-27 DIAGNOSIS — L570 Actinic keratosis: Secondary | ICD-10-CM

## 2011-10-27 DIAGNOSIS — Z79899 Other long term (current) drug therapy: Secondary | ICD-10-CM

## 2011-10-27 DIAGNOSIS — I10 Essential (primary) hypertension: Secondary | ICD-10-CM

## 2011-10-27 DIAGNOSIS — R209 Unspecified disturbances of skin sensation: Secondary | ICD-10-CM

## 2011-10-27 DIAGNOSIS — G5790 Unspecified mononeuropathy of unspecified lower limb: Secondary | ICD-10-CM

## 2011-10-27 DIAGNOSIS — R202 Paresthesia of skin: Secondary | ICD-10-CM

## 2011-10-27 DIAGNOSIS — G5793 Unspecified mononeuropathy of bilateral lower limbs: Secondary | ICD-10-CM

## 2011-10-27 DIAGNOSIS — R634 Abnormal weight loss: Secondary | ICD-10-CM

## 2011-10-27 DIAGNOSIS — M545 Low back pain: Secondary | ICD-10-CM

## 2011-10-27 LAB — BASIC METABOLIC PANEL
Calcium: 8.9 mg/dL (ref 8.4–10.5)
GFR: 50.63 mL/min — ABNORMAL LOW (ref >60.00)
Potassium: 4.6 mEq/L (ref 3.5–5.1)
Sodium: 137 mEq/L (ref 135–145)

## 2011-10-27 LAB — CBC WITH DIFFERENTIAL/PLATELET
Eosinophils Relative: 4.1 % (ref 0.0–5.0)
HCT: 32.7 % — ABNORMAL LOW (ref 39.0–52.0)
Hemoglobin: 11.1 g/dL — ABNORMAL LOW (ref 13.0–17.0)
Lymphocytes Relative: 22.8 % (ref 12.0–46.0)
Lymphs Abs: 1.8 10*3/uL (ref 0.7–4.0)
Monocytes Relative: 7.5 % (ref 3.0–12.0)
Neutro Abs: 5.2 10*3/uL (ref 1.4–7.7)
RBC: 3.2 Mil/uL — ABNORMAL LOW (ref 4.22–5.81)
WBC: 8 10*3/uL (ref 4.5–10.5)

## 2011-10-27 LAB — HEPATIC FUNCTION PANEL
ALT: 20 U/L (ref 0–53)
AST: 23 U/L (ref 0–37)
Bilirubin, Direct: 0.1 mg/dL (ref 0.0–0.3)
Total Bilirubin: 0.7 mg/dL (ref 0.3–1.2)

## 2011-10-27 LAB — VITAMIN B12: Vitamin B-12: 562 pg/mL (ref 211–911)

## 2011-10-27 LAB — SEDIMENTATION RATE: Sed Rate: 14 mm/hr (ref 0–22)

## 2011-10-29 LAB — PROTEIN ELECTROPHORESIS, SERUM
Albumin ELP: 65.2 % (ref 55.8–66.1)
Alpha-2-Globulin: 10.5 % (ref 7.1–11.8)
Beta 2: 3.9 % (ref 3.2–6.5)
Beta Globulin: 5.4 % (ref 4.7–7.2)
Total Protein, Serum Electrophoresis: 6 g/dL (ref 6.0–8.3)

## 2011-11-23 ENCOUNTER — Ambulatory Visit (INDEPENDENT_AMBULATORY_CARE_PROVIDER_SITE_OTHER): Payer: Medicare Other | Admitting: Internal Medicine

## 2011-11-23 ENCOUNTER — Encounter: Payer: Self-pay | Admitting: Internal Medicine

## 2011-11-23 VITALS — BP 130/68 | HR 72 | Temp 97.6°F | Resp 16 | Wt 145.0 lb

## 2011-11-23 DIAGNOSIS — G5793 Unspecified mononeuropathy of bilateral lower limbs: Secondary | ICD-10-CM

## 2011-11-23 DIAGNOSIS — G5790 Unspecified mononeuropathy of unspecified lower limb: Secondary | ICD-10-CM

## 2011-11-23 DIAGNOSIS — I10 Essential (primary) hypertension: Secondary | ICD-10-CM

## 2011-11-23 MED ORDER — ZOLPIDEM TARTRATE 10 MG PO TABS: 10.0000 mg | ORAL_TABLET | Freq: Every evening | ORAL | Status: DC | PRN

## 2011-11-23 MED ORDER — GABAPENTIN 100 MG PO CAPS: ORAL_CAPSULE | ORAL | Status: DC

## 2011-11-23 NOTE — Assessment & Plan Note (Signed)
Increase Gababentin

## 2011-11-23 NOTE — Progress Notes (Signed)
  Subjective:    Patient ID: Raymond Medina, male    DOB: 27-Aug-1926, 78 y.o.   MRN: [id number]  HPI The patient presents for a follow-up of  chronic hypertension, chronic COPD, OA controlled with medicines. C/o skin growth on R temple - re grew. C/o B feet burning pains on top - gabapentin helped - on 1 a day now     Review of Systems  Constitutional: Negative for appetite change, fatigue and unexpected weight change.  HENT: Negative for nosebleeds, congestion, sore throat, sneezing, trouble swallowing and neck pain.   Eyes: Negative for itching and visual disturbance.  Respiratory: Negative for cough.   Cardiovascular: Negative for chest pain, palpitations and leg swelling.  Gastrointestinal: Negative for nausea, diarrhea, blood in stool and abdominal distention.  Genitourinary: Negative for frequency and hematuria.  Musculoskeletal: Negative for back pain, joint swelling and gait problem.  Skin: Negative for rash.  Neurological: Negative for dizziness, tremors, speech difficulty and weakness.  Psychiatric/Behavioral: Positive for disturbed wake/sleep cycle. Negative for suicidal ideas, dysphoric mood and agitation. The patient is not nervous/anxious.        Objective:   Physical Exam  Constitutional: He is oriented to person, place, and time. He appears well-developed.  HENT:  Mouth/Throat: Oropharynx is clear and moist.  Eyes: Conjunctivae are normal. Pupils are equal, round, and reactive to light.  Neck: Normal range of motion. No JVD present. No thyromegaly present.  Cardiovascular: Normal rate, regular rhythm, normal heart sounds and intact distal pulses.  Exam reveals no gallop and no friction rub.   No murmur heard. Pulmonary/Chest: Effort normal and breath sounds normal. No respiratory distress. He has no wheezes. He has no rales. He exhibits no tenderness.  Abdominal: Soft. Bowel sounds are normal. He exhibits no distension and no mass. There is no tenderness. There is no  rebound and no guarding.  Musculoskeletal: Normal range of motion. He exhibits no edema and no tenderness.  Lymphadenopathy:    He has no cervical adenopathy.  Neurological: He is alert and oriented to person, place, and time. He has normal reflexes. No cranial nerve deficit. He exhibits normal muscle tone. Coordination normal.  Skin: Skin is warm and dry. No rash noted.  Psychiatric: He has a normal mood and affect. His behavior is normal. Judgment and thought content normal.  AK R temple - resolving B feet w/spider veins  Wt Readings from Last 3 Encounters:  11/23/11 145 lb (65.772 kg)  10/23/11 142 lb (64.411 kg)  08/14/11 144 lb (65.318 kg)             Assessment & Plan:

## 2011-11-23 NOTE — Assessment & Plan Note (Signed)
Continue with current prescription therapy as reflected on the Med list.  

## 2012-01-01 ENCOUNTER — Ambulatory Visit (INDEPENDENT_AMBULATORY_CARE_PROVIDER_SITE_OTHER): Payer: Medicare Other | Admitting: Internal Medicine

## 2012-01-01 ENCOUNTER — Encounter: Payer: Self-pay | Admitting: Internal Medicine

## 2012-01-01 VITALS — BP 128/68 | HR 75 | Temp 97.5°F | Wt 149.0 lb

## 2012-01-01 DIAGNOSIS — M7989 Other specified soft tissue disorders: Secondary | ICD-10-CM

## 2012-01-01 DIAGNOSIS — M25429 Effusion, unspecified elbow: Secondary | ICD-10-CM

## 2012-01-01 DIAGNOSIS — M7030 Other bursitis of elbow, unspecified elbow: Secondary | ICD-10-CM

## 2012-01-01 DIAGNOSIS — M25422 Effusion, left elbow: Secondary | ICD-10-CM

## 2012-01-01 DIAGNOSIS — M7020 Olecranon bursitis, unspecified elbow: Secondary | ICD-10-CM

## 2012-01-01 NOTE — Patient Instructions (Addendum)
Postprocedure instructions :    A Band-Aid should be left on for 12 hours. Injection therapy is not a cure itself. It is used in conjunction with other modalities. You can use nonsteroidal anti-inflammatories like ibuprofen , hot and cold compresses. Rest is recommended in the next 24 hours. You need to report immediately  if fever, chills or any signs of infection develop. 

## 2012-01-02 ENCOUNTER — Encounter: Payer: Self-pay | Admitting: Internal Medicine

## 2012-01-02 DIAGNOSIS — M7030 Other bursitis of elbow, unspecified elbow: Secondary | ICD-10-CM | POA: Insufficient documentation

## 2012-01-02 DIAGNOSIS — M25422 Effusion, left elbow: Secondary | ICD-10-CM | POA: Insufficient documentation

## 2012-01-02 MED ORDER — METHYLPREDNISOLONE ACETATE 80 MG/ML IJ SUSP: 20.0000 mg | Freq: Once | INTRAMUSCULAR | Status: DC

## 2012-01-02 NOTE — Assessment & Plan Note (Signed)
POCUS

## 2012-01-02 NOTE — Assessment & Plan Note (Signed)
Options discussed Will aspirate and inject w/steroids

## 2012-01-02 NOTE — Progress Notes (Signed)
  Subjective:    Patient ID: Raymond Medina, male    DOB: 1926-08-31, 78 y.o.   MRN: [id number]  HPI  C/o L elbow swelling worse over past 2 wks No redness, pain or fever  Review of Systems  Constitutional: Negative for fever and chills.  Musculoskeletal: Positive for joint swelling and arthralgias.  Neurological: Negative for tremors and weakness.       Objective:   Physical Exam  Constitutional: He is oriented to person, place, and time. He appears well-developed.  HENT:  Mouth/Throat: Oropharynx is clear and moist.  Eyes: Conjunctivae are normal. Pupils are equal, round, and reactive to light.  Neck: Normal range of motion. No JVD present. No thyromegaly present.  Cardiovascular: Normal rate, regular rhythm, normal heart sounds and intact distal pulses.  Exam reveals no gallop and no friction rub.   No murmur heard. Pulmonary/Chest: Effort normal and breath sounds normal. No respiratory distress. He has no wheezes. He has no rales. He exhibits no tenderness.  Abdominal: Soft. Bowel sounds are normal. He exhibits no distension and no mass. There is no tenderness. There is no rebound and no guarding.  Musculoskeletal: Normal range of motion. He exhibits edema (L post elbow w/localized swelling). He exhibits no tenderness.  Lymphadenopathy:    He has no cervical adenopathy.  Neurological: He is alert and oriented to person, place, and time. He has normal reflexes. No cranial nerve deficit. He exhibits normal muscle tone. Coordination normal.  Skin: Skin is warm and dry. No rash noted. No erythema.  Psychiatric: He has a normal mood and affect. His behavior is normal. Judgment and thought content normal.   Procedure Note :    Procedure :   Point of care (POC) sonography examination   Indication: elbow swelling   Equipment used: Sonosite M-Turbo with HFL38x/13-6 MHz transducer linear probe. The images were stored in the unit and later transferred in storage.  The patient was  placed in a decubitus position.  This study revealed a large hypoechoic swelling over the olecranon on the L.   Impression: L olecranon bursitis  Options to treat were discussed. He elected aspiration/steroids   Procedure Note :    Procedure :Elbow  bursa aspiration and steroid injection   Indication: Bursitis with refractory  chronic pain.   Risks including unsuccessful procedure , bleeding, infection, bruising, skin atrophy and others were explained to the patient in detail as well as the benefits. Informed consent was obtained and signed.   Tthe patient was placed in a comfortable position. Skin was prepped with Betadine and alcohol  and anesthetized with a cooling spray. Then, a 3 cc syringe with a 1 inch long 25-gauge needle was used for a skin over bursa injection 0.5 mL of 2% lidocaine. 22 gauge 1 inch needle on a 10 ml syringe was used to aspirate 5 ml of reddish sero-sanguinous fluid (discarded). Then, 20 mg of Depo-Medrol and 1 cc Lido was injected in the bursa .  Band-Aid was applied. ACE wrap.   Tolerated well. Complications: None.             Assessment & Plan:

## 2012-02-17 ENCOUNTER — Ambulatory Visit (INDEPENDENT_AMBULATORY_CARE_PROVIDER_SITE_OTHER): Payer: Medicare Other

## 2012-02-17 DIAGNOSIS — Z23 Encounter for immunization: Secondary | ICD-10-CM

## 2012-02-22 ENCOUNTER — Encounter: Payer: Self-pay | Admitting: Internal Medicine

## 2012-02-22 ENCOUNTER — Ambulatory Visit (INDEPENDENT_AMBULATORY_CARE_PROVIDER_SITE_OTHER): Payer: Medicare Other | Admitting: Internal Medicine

## 2012-02-22 VITALS — BP 120/60 | HR 76 | Temp 97.4°F | Resp 16 | Wt 146.0 lb

## 2012-02-22 DIAGNOSIS — J449 Chronic obstructive pulmonary disease, unspecified: Secondary | ICD-10-CM

## 2012-02-22 DIAGNOSIS — I10 Essential (primary) hypertension: Secondary | ICD-10-CM

## 2012-02-22 DIAGNOSIS — R634 Abnormal weight loss: Secondary | ICD-10-CM

## 2012-02-22 DIAGNOSIS — R5383 Other fatigue: Secondary | ICD-10-CM

## 2012-02-22 DIAGNOSIS — M545 Low back pain: Secondary | ICD-10-CM

## 2012-02-22 DIAGNOSIS — G459 Transient cerebral ischemic attack, unspecified: Secondary | ICD-10-CM

## 2012-02-22 MED ORDER — TRIAMCINOLONE ACETONIDE 0.5 % EX CREA: TOPICAL_CREAM | Freq: Two times a day (BID) | CUTANEOUS | Status: DC

## 2012-02-22 NOTE — Assessment & Plan Note (Signed)
Continue with current prescription therapy as reflected on the Med list.  

## 2012-02-22 NOTE — Assessment & Plan Note (Signed)
No recurrence. 

## 2012-02-22 NOTE — Progress Notes (Signed)
Subjective:    Patient ID: Raymond Medina, male    DOB: 1926/09/26, 78 y.o.   MRN: [id number]  HPI The patient presents for a follow-up of  chronic hypertension, chronic COPD, OA controlled with medicines. C/o skin growth on R temple - re grew. C/o B feet burning pains on top - gabapentin helped - on 1 a day now C/o GERD - stopped omeprazole due to dizziness...  Wt Readings from Last 3 Encounters:  02/22/12 146 lb (66.225 kg)  01/01/12 149 lb (67.586 kg)  11/23/11 145 lb (65.772 kg)     BP Readings from Last 3 Encounters:  02/22/12 120/60  01/01/12 128/68  11/23/11 130/68      Review of Systems  Constitutional: Negative for appetite change, fatigue and unexpected weight change.  HENT: Negative for nosebleeds, congestion, sore throat, sneezing, trouble swallowing and neck pain.   Eyes: Negative for itching and visual disturbance.  Respiratory: Negative for cough.   Cardiovascular: Negative for chest pain, palpitations and leg swelling.  Gastrointestinal: Negative for nausea, diarrhea, blood in stool and abdominal distention.  Genitourinary: Negative for frequency and hematuria.  Musculoskeletal: Negative for back pain, joint swelling and gait problem.  Skin: Negative for rash.  Neurological: Negative for dizziness, tremors, speech difficulty and weakness.  Psychiatric/Behavioral: Positive for disturbed wake/sleep cycle. Negative for suicidal ideas, dysphoric mood and agitation. The patient is not nervous/anxious.        Objective:   Physical Exam  Constitutional: He is oriented to person, place, and time. He appears well-developed.  HENT:  Mouth/Throat: Oropharynx is clear and moist.  Eyes: Conjunctivae normal are normal. Pupils are equal, round, and reactive to light.  Neck: Normal range of motion. No JVD present. No thyromegaly present.  Cardiovascular: Normal rate, regular rhythm, normal heart sounds and intact distal pulses.  Exam reveals no gallop and no friction rub.    No murmur heard. Pulmonary/Chest: Effort normal and breath sounds normal. No respiratory distress. He has no wheezes. He has no rales. He exhibits no tenderness.  Abdominal: Soft. Bowel sounds are normal. He exhibits no distension and no mass. There is no tenderness. There is no rebound and no guarding.  Musculoskeletal: Normal range of motion. He exhibits no edema and no tenderness.  Lymphadenopathy:    He has no cervical adenopathy.  Neurological: He is alert and oriented to person, place, and time. He has normal reflexes. No cranial nerve deficit. He exhibits normal muscle tone. Coordination normal.  Skin: Skin is warm and dry. No rash noted.  Psychiatric: He has a normal mood and affect. His behavior is normal. Judgment and thought content normal.   B feet w/spider veins     Lab Results  Component Value Date   WBC 8.0 10/27/2011   HGB 11.1* 10/27/2011   HCT 32.7* 10/27/2011   PLT 243.0 10/27/2011   GLUCOSE 81 10/27/2011   CHOL  Value: 160        ATP III CLASSIFICATION:  <200     mg/dL   Desirable  409-811  mg/dL   Borderline High  >=914    mg/dL   High        7/82/9562   TRIG 38 09/10/2010   HDL 92 09/10/2010   LDLCALC  Value: 60        Total Cholesterol/HDL:CHD Risk Coronary Heart Disease Risk Table                     Men   Women  1/2 Average Risk   3.4   3.3  Average Risk       5.0   4.4  2 X Average Risk   9.6   7.1  3 X Average Risk  23.4   11.0        Use the calculated Patient Ratio above and the CHD Risk Table to determine the patient's CHD Risk.        ATP III CLASSIFICATION (LDL):  <100     mg/dL   Optimal  409-811  mg/dL   Near or Above                    Optimal  130-159  mg/dL   Borderline  914-782  mg/dL   High  >956     mg/dL   Very High 07/15/863   ALT 20 10/27/2011   AST 23 10/27/2011   NA 137 10/27/2011   K 4.6 10/27/2011   CL 104 10/27/2011   CREATININE 1.4 10/27/2011   BUN 27* 10/27/2011   CO2 24 10/27/2011   TSH 0.84 10/27/2011   PSA 0.01* 06/23/2010   INR 1.01 09/09/2010     HGBA1C  Value: 5.6 (NOTE)                                                                       According to the ADA Clinical Practice Recommendations for 2011, when HbA1c is used as a screening test:   >=6.5%   Diagnostic of Diabetes Mellitus           (if abnormal result  is confirmed)  5.7-6.4%   Increased risk of developing Diabetes Mellitus  References:Diagnosis and Classification of Diabetes Mellitus,Diabetes Care,2011,34(Suppl 1):S62-S69 and Standards of Medical Care in         Diabetes - 2011,Diabetes Care,2011,34  (Suppl 1):S11-S61. 09/10/2010           Assessment & Plan:

## 2012-02-22 NOTE — Assessment & Plan Note (Signed)
Better  

## 2012-02-22 NOTE — Assessment & Plan Note (Signed)
Watching wt 

## 2012-03-03 ENCOUNTER — Other Ambulatory Visit: Payer: Self-pay | Admitting: Internal Medicine

## 2012-03-15 ENCOUNTER — Telehealth: Payer: Self-pay | Admitting: *Deleted

## 2012-03-15 NOTE — Telephone Encounter (Signed)
Called ins # above. Zolpidem 10 mg PA is approved 01/14/12-03/15/2013. Pharmacy informed.

## 2012-05-23 ENCOUNTER — Encounter: Payer: Self-pay | Admitting: Internal Medicine

## 2012-05-23 ENCOUNTER — Other Ambulatory Visit (INDEPENDENT_AMBULATORY_CARE_PROVIDER_SITE_OTHER): Payer: Medicare Other

## 2012-05-23 ENCOUNTER — Ambulatory Visit (INDEPENDENT_AMBULATORY_CARE_PROVIDER_SITE_OTHER): Payer: Medicare Other | Admitting: Internal Medicine

## 2012-05-23 VITALS — BP 108/68 | HR 80 | Temp 96.9°F | Resp 16 | Wt 142.0 lb

## 2012-05-23 DIAGNOSIS — E785 Hyperlipidemia, unspecified: Secondary | ICD-10-CM

## 2012-05-23 DIAGNOSIS — R5381 Other malaise: Secondary | ICD-10-CM

## 2012-05-23 DIAGNOSIS — I639 Cerebral infarction, unspecified: Secondary | ICD-10-CM

## 2012-05-23 DIAGNOSIS — R5383 Other fatigue: Secondary | ICD-10-CM

## 2012-05-23 DIAGNOSIS — I6350 Cerebral infarction due to unspecified occlusion or stenosis of unspecified cerebral artery: Secondary | ICD-10-CM

## 2012-05-23 DIAGNOSIS — I10 Essential (primary) hypertension: Secondary | ICD-10-CM

## 2012-05-23 DIAGNOSIS — R634 Abnormal weight loss: Secondary | ICD-10-CM

## 2012-05-23 DIAGNOSIS — D649 Anemia, unspecified: Secondary | ICD-10-CM

## 2012-05-23 DIAGNOSIS — G459 Transient cerebral ischemic attack, unspecified: Secondary | ICD-10-CM

## 2012-05-23 DIAGNOSIS — I951 Orthostatic hypotension: Secondary | ICD-10-CM

## 2012-05-23 DIAGNOSIS — K219 Gastro-esophageal reflux disease without esophagitis: Secondary | ICD-10-CM

## 2012-05-23 LAB — LIPID PANEL
Cholesterol: 164 mg/dL (ref 0–200)
LDL Cholesterol: 79 mg/dL (ref 0–99)
Total CHOL/HDL Ratio: 2
Triglycerides: 69 mg/dL (ref 0.0–149.0)
VLDL: 13.8 mg/dL (ref 0.0–40.0)

## 2012-05-23 LAB — CBC WITH DIFFERENTIAL/PLATELET
Basophils Relative: 0.6 % (ref 0.0–3.0)
Eosinophils Relative: 4.7 % (ref 0.0–5.0)
HCT: 31 % — ABNORMAL LOW (ref 39.0–52.0)
Lymphs Abs: 2.1 10*3/uL (ref 0.7–4.0)
MCV: 101.8 fl — ABNORMAL HIGH (ref 78.0–100.0)
Monocytes Absolute: 0.7 10*3/uL (ref 0.1–1.0)
Monocytes Relative: 7.7 % (ref 3.0–12.0)
Platelets: 230 10*3/uL (ref 150.0–400.0)
RBC: 3.04 Mil/uL — ABNORMAL LOW (ref 4.22–5.81)
WBC: 8.7 10*3/uL (ref 4.5–10.5)

## 2012-05-23 LAB — HEPATIC FUNCTION PANEL
ALT: 15 U/L (ref 0–53)
AST: 17 U/L (ref 0–37)
Bilirubin, Direct: 0.1 mg/dL (ref 0.0–0.3)
Total Bilirubin: 0.5 mg/dL (ref 0.3–1.2)

## 2012-05-23 LAB — IBC PANEL
Iron: 72 ug/dL (ref 42–165)
Saturation Ratios: 27.5 % (ref 20.0–50.0)

## 2012-05-23 LAB — TSH: TSH: 0.95 u[IU]/mL (ref 0.35–5.50)

## 2012-05-23 NOTE — Assessment & Plan Note (Signed)
Exforge - take 1/2 tab a day

## 2012-05-23 NOTE — Assessment & Plan Note (Signed)
Continue with current prescription therapy as reflected on the Med list.  

## 2012-05-23 NOTE — Assessment & Plan Note (Signed)
Off Prilosec

## 2012-05-23 NOTE — Assessment & Plan Note (Signed)
Chronic, stable 

## 2012-05-23 NOTE — Assessment & Plan Note (Signed)
Exforge - take 1/2 tab a day Continue with current prescription therapy as reflected on the Med list.

## 2012-05-23 NOTE — Assessment & Plan Note (Signed)
No relapse 

## 2012-05-23 NOTE — Patient Instructions (Addendum)
Exforge - take 1/2 tab a day 

## 2012-05-23 NOTE — Assessment & Plan Note (Signed)
Discussed diet - he has been eating a lot, good appetite.Marland KitchenMarland KitchenMarland Kitchen

## 2012-05-23 NOTE — Progress Notes (Signed)
Subjective:    Patient ID: Raymond Medina, male    DOB: 02-11-1927, 78 y.o.   MRN: [id number]  HPI The patient presents for a follow-up of  chronic hypertension, chronic COPD, OA controlled with medicines. C/o skin growth on R temple - re grew. C/o B feet burning pains on top - gabapentin helped - on 1 a day now. C/o dizziness at times. C/o GERD - stopped omeprazole due to dizziness...  Wt Readings from Last 3 Encounters:  05/23/12 142 lb (64.411 kg)  02/22/12 146 lb (66.225 kg)  01/01/12 149 lb (67.586 kg)     BP Readings from Last 3 Encounters:  05/23/12 108/68  02/22/12 120/60  01/01/12 128/68      Review of Systems  Constitutional: Negative for appetite change, fatigue and unexpected weight change.  HENT: Negative for nosebleeds, congestion, sore throat, sneezing, trouble swallowing and neck pain.   Eyes: Negative for itching and visual disturbance.  Respiratory: Negative for cough.   Cardiovascular: Negative for chest pain, palpitations and leg swelling.  Gastrointestinal: Negative for nausea, diarrhea, blood in stool and abdominal distention.  Genitourinary: Negative for frequency and hematuria.  Musculoskeletal: Negative for back pain, joint swelling and gait problem.  Skin: Negative for rash.  Neurological: Negative for dizziness, tremors, speech difficulty and weakness.  Psychiatric/Behavioral: Positive for sleep disturbance. Negative for suicidal ideas, dysphoric mood and agitation. The patient is not nervous/anxious.        Objective:   Physical Exam  Constitutional: He is oriented to person, place, and time. He appears well-developed.  HENT:  Mouth/Throat: Oropharynx is clear and moist.  Eyes: Conjunctivae normal are normal. Pupils are equal, round, and reactive to light.  Neck: Normal range of motion. No JVD present. No thyromegaly present.  Cardiovascular: Normal rate, regular rhythm, normal heart sounds and intact distal pulses.  Exam reveals no gallop and  no friction rub.   No murmur heard. Pulmonary/Chest: Effort normal and breath sounds normal. No respiratory distress. He has no wheezes. He has no rales. He exhibits no tenderness.  Abdominal: Soft. Bowel sounds are normal. He exhibits no distension and no mass. There is no tenderness. There is no rebound and no guarding.  Musculoskeletal: Normal range of motion. He exhibits no edema and no tenderness.  Lymphadenopathy:    He has no cervical adenopathy.  Neurological: He is alert and oriented to person, place, and time. He has normal reflexes. No cranial nerve deficit. He exhibits normal muscle tone. Coordination normal.  Skin: Skin is warm and dry. No rash noted.  Psychiatric: He has a normal mood and affect. His behavior is normal. Judgment and thought content normal.   B feet w/spider veins     Lab Results  Component Value Date   WBC 8.0 10/27/2011   HGB 11.1* 10/27/2011   HCT 32.7* 10/27/2011   PLT 243.0 10/27/2011   GLUCOSE 81 10/27/2011   CHOL  Value: 160        ATP III CLASSIFICATION:  <200     mg/dL   Desirable  621-308  mg/dL   Borderline High  >=657    mg/dL   High        8/46/9629   TRIG 38 09/10/2010   HDL 92 09/10/2010   LDLCALC  Value: 60        Total Cholesterol/HDL:CHD Risk Coronary Heart Disease Risk Table                     Men  Women  1/2 Average Risk   3.4   3.3  Average Risk       5.0   4.4  2 X Average Risk   9.6   7.1  3 X Average Risk  23.4   11.0        Use the calculated Patient Ratio above and the CHD Risk Table to determine the patient's CHD Risk.        ATP III CLASSIFICATION (LDL):  <100     mg/dL   Optimal  454-098  mg/dL   Near or Above                    Optimal  130-159  mg/dL   Borderline  119-147  mg/dL   High  >829     mg/dL   Very High 5/62/1308   ALT 20 10/27/2011   AST 23 10/27/2011   NA 137 10/27/2011   K 4.6 10/27/2011   CL 104 10/27/2011   CREATININE 1.4 10/27/2011   BUN 27* 10/27/2011   CO2 24 10/27/2011   TSH 0.84 10/27/2011   PSA 0.01* 06/23/2010    INR 1.01 09/09/2010   HGBA1C  Value: 5.6 (NOTE)                                                                       According to the ADA Clinical Practice Recommendations for 2011, when HbA1c is used as a screening test:   >=6.5%   Diagnostic of Diabetes Mellitus           (if abnormal result  is confirmed)  5.7-6.4%   Increased risk of developing Diabetes Mellitus  References:Diagnosis and Classification of Diabetes Mellitus,Diabetes Care,2011,34(Suppl 1):S62-S69 and Standards of Medical Care in         Diabetes - 2011,Diabetes Care,2011,34  (Suppl 1):S11-S61. 09/10/2010           Assessment & Plan:

## 2012-07-21 ENCOUNTER — Other Ambulatory Visit: Payer: Self-pay | Admitting: Internal Medicine

## 2012-08-24 ENCOUNTER — Encounter: Payer: Self-pay | Admitting: Internal Medicine

## 2012-08-24 ENCOUNTER — Ambulatory Visit (INDEPENDENT_AMBULATORY_CARE_PROVIDER_SITE_OTHER): Payer: Medicare Other | Admitting: Internal Medicine

## 2012-08-24 VITALS — BP 120/64 | HR 76 | Temp 97.5°F | Resp 16 | Wt 139.0 lb

## 2012-08-24 DIAGNOSIS — R5381 Other malaise: Secondary | ICD-10-CM

## 2012-08-24 DIAGNOSIS — D649 Anemia, unspecified: Secondary | ICD-10-CM

## 2012-08-24 DIAGNOSIS — I639 Cerebral infarction, unspecified: Secondary | ICD-10-CM

## 2012-08-24 DIAGNOSIS — R5383 Other fatigue: Secondary | ICD-10-CM

## 2012-08-24 DIAGNOSIS — M545 Low back pain: Secondary | ICD-10-CM

## 2012-08-24 DIAGNOSIS — G459 Transient cerebral ischemic attack, unspecified: Secondary | ICD-10-CM

## 2012-08-24 DIAGNOSIS — L570 Actinic keratosis: Secondary | ICD-10-CM

## 2012-08-24 DIAGNOSIS — R634 Abnormal weight loss: Secondary | ICD-10-CM

## 2012-08-24 DIAGNOSIS — I6350 Cerebral infarction due to unspecified occlusion or stenosis of unspecified cerebral artery: Secondary | ICD-10-CM

## 2012-08-24 DIAGNOSIS — I739 Peripheral vascular disease, unspecified: Secondary | ICD-10-CM

## 2012-08-24 NOTE — Progress Notes (Signed)
Patient ID: Raymond Medina, male   DOB: 03-02-1927, 79 y.o.   MRN: [id number]   Subjective:    Patient ID: Raymond Medina, male    DOB: 09/09/1926, 79 y.o.   MRN: [id number]  HPI The patient presents for a follow-up of  chronic hypertension, chronic COPD, OA controlled with medicines. C/o skin growth on R temple - re grew. C/o B feet burning pains on top - gabapentin helped - on 1 a day now. C/o dizziness at times. C/o GERD - stopped omeprazole due to dizziness...  Wt Readings from Last 3 Encounters:  08/24/12 139 lb (63.05 kg)  05/23/12 142 lb (64.411 kg)  02/22/12 146 lb (66.225 kg)     BP Readings from Last 3 Encounters:  08/24/12 120/64  05/23/12 108/68  02/22/12 120/60      Review of Systems  Constitutional: Negative for appetite change, fatigue and unexpected weight change.  HENT: Negative for nosebleeds, congestion, sore throat, sneezing, trouble swallowing and neck pain.   Eyes: Negative for itching and visual disturbance.  Respiratory: Negative for cough.   Cardiovascular: Negative for chest pain, palpitations and leg swelling.  Gastrointestinal: Negative for nausea, diarrhea, blood in stool and abdominal distention.  Genitourinary: Negative for frequency and hematuria.  Musculoskeletal: Negative for back pain, joint swelling and gait problem.  Skin: Negative for rash.  Neurological: Negative for dizziness, tremors, speech difficulty and weakness.  Psychiatric/Behavioral: Positive for sleep disturbance. Negative for suicidal ideas, dysphoric mood and agitation. The patient is not nervous/anxious.        Objective:   Physical Exam  Constitutional: He is oriented to person, place, and time. He appears well-developed.  HENT:  Mouth/Throat: Oropharynx is clear and moist.  Eyes: Conjunctivae are normal. Pupils are equal, round, and reactive to light.  Neck: Normal range of motion. No JVD present. No thyromegaly present.  Cardiovascular: Normal rate, regular rhythm, normal  heart sounds and intact distal pulses.  Exam reveals no gallop and no friction rub.   No murmur heard. Pulmonary/Chest: Effort normal and breath sounds normal. No respiratory distress. He has no wheezes. He has no rales. He exhibits no tenderness.  Abdominal: Soft. Bowel sounds are normal. He exhibits no distension and no mass. There is no tenderness. There is no rebound and no guarding.  Musculoskeletal: Normal range of motion. He exhibits no edema and no tenderness.  Lymphadenopathy:    He has no cervical adenopathy.  Neurological: He is alert and oriented to person, place, and time. He has normal reflexes. No cranial nerve deficit. He exhibits normal muscle tone. Coordination normal.  Skin: Skin is warm and dry. No rash noted.  Psychiatric: He has a normal mood and affect. His behavior is normal. Judgment and thought content normal.   B feet w/spider veins     Lab Results  Component Value Date   WBC 8.7 05/23/2012   HGB 10.4* 05/23/2012   HCT 31.0* 05/23/2012   PLT 230.0 05/23/2012   GLUCOSE 81 10/27/2011   CHOL 164 05/23/2012   TRIG 69.0 05/23/2012   HDL 70.80 05/23/2012   LDLCALC 79 05/23/2012   ALT 15 05/23/2012   AST 17 05/23/2012   NA 137 10/27/2011   K 4.6 10/27/2011   CL 104 10/27/2011   CREATININE 1.4 10/27/2011   BUN 27* 10/27/2011   CO2 24 10/27/2011   TSH 0.95 05/23/2012   PSA 0.01* 06/23/2010   INR 1.01 09/09/2010   HGBA1C  Value: 5.6 (NOTE)  According to the ADA Clinical Practice Recommendations for 2011, when HbA1c is used as a screening test:   >=6.5%   Diagnostic of Diabetes Mellitus           (if abnormal result  is confirmed)  5.7-6.4%   Increased risk of developing Diabetes Mellitus  References:Diagnosis and Classification of Diabetes Mellitus,Diabetes Care,2011,34(Suppl 1):S62-S69 and Standards of Medical Care in         Diabetes - 2011,Diabetes Care,2011,34  (Suppl 1):S11-S61. 09/10/2010       Procedure Note :     Procedure : Cryosurgery   Indication: Actinic keratosis(es)   Risks including unsuccessful procedure , bleeding, infection, bruising, scar, a need for a repeat  procedure and others were explained to the patient in detail as well as the benefits. Informed consent was obtained verbally.   4  lesion(s)  on the face  was/were treated with liquid nitrogen on a Q-tip in a usual fasion . Band-Aid was applied and antibiotic ointment was given for a later use.   Tolerated well. Complications none.   Postprocedure instructions :     Keep the wounds clean. You can wash them with liquid soap and water. Pat dry with gauze or a Kleenex tissue  Before applying antibiotic ointment and a Band-Aid.   You need to report immediately  if  any signs of infection develop.         Assessment & Plan:

## 2012-08-24 NOTE — Patient Instructions (Signed)
   Postprocedure instructions :     Keep the wounds clean. You can wash them with liquid soap and water. Pat dry with gauze or a Kleenex tissue  Before applying antibiotic ointment and a Band-Aid.   You need to report immediately  if  any signs of infection develop.    

## 2012-08-24 NOTE — Assessment & Plan Note (Signed)
No relapse 

## 2012-08-24 NOTE — Assessment & Plan Note (Signed)
Continue with current prescription therapy as reflected on the Med list.  

## 2012-08-24 NOTE — Assessment & Plan Note (Signed)
Wt Readings from Last 3 Encounters:  08/24/12 139 lb (63.05 kg)  05/23/12 142 lb (64.411 kg)  02/22/12 146 lb (66.225 kg)

## 2012-08-24 NOTE — Assessment & Plan Note (Signed)
See procedure 

## 2012-08-24 NOTE — Assessment & Plan Note (Signed)
CBC

## 2012-08-24 NOTE — Assessment & Plan Note (Signed)
Doing ok.

## 2012-10-01 ENCOUNTER — Other Ambulatory Visit: Payer: Self-pay | Admitting: Internal Medicine

## 2012-11-14 ENCOUNTER — Ambulatory Visit (INDEPENDENT_AMBULATORY_CARE_PROVIDER_SITE_OTHER): Payer: Medicare Other | Admitting: Internal Medicine

## 2012-11-14 ENCOUNTER — Encounter: Payer: Self-pay | Admitting: Internal Medicine

## 2012-11-14 VITALS — BP 148/90 | HR 80 | Temp 96.8°F | Resp 16 | Wt 143.0 lb

## 2012-11-14 DIAGNOSIS — R5383 Other fatigue: Secondary | ICD-10-CM

## 2012-11-14 DIAGNOSIS — I10 Essential (primary) hypertension: Secondary | ICD-10-CM

## 2012-11-14 DIAGNOSIS — R634 Abnormal weight loss: Secondary | ICD-10-CM

## 2012-11-14 DIAGNOSIS — L570 Actinic keratosis: Secondary | ICD-10-CM

## 2012-11-14 DIAGNOSIS — R5381 Other malaise: Secondary | ICD-10-CM

## 2012-11-14 DIAGNOSIS — R21 Rash and other nonspecific skin eruption: Secondary | ICD-10-CM

## 2012-11-14 MED ORDER — METHYLPREDNISOLONE ACETATE 80 MG/ML IJ SUSP
80.0000 mg | Freq: Once | INTRAMUSCULAR | Status: AC
  Administered 2012-11-14: 80 mg via INTRAMUSCULAR

## 2012-11-14 MED ORDER — TRIAMCINOLONE ACETONIDE 0.5 % EX CREA: TOPICAL_CREAM | Freq: Two times a day (BID) | CUTANEOUS | Status: DC

## 2012-11-14 NOTE — Assessment & Plan Note (Signed)
Better  

## 2012-11-14 NOTE — Assessment & Plan Note (Signed)
Continue with current prescription therapy as reflected on the Med list.  

## 2012-11-14 NOTE — Assessment & Plan Note (Signed)
Wt Readings from Last 3 Encounters:  11/14/12 143 lb (64.864 kg)  08/24/12 139 lb (63.05 kg)  05/23/12 142 lb (64.411 kg)

## 2012-11-14 NOTE — Assessment & Plan Note (Signed)
6/14 ?  etiol - chest, abd Triamc cream Depomedrol 80 mg im

## 2012-11-14 NOTE — Assessment & Plan Note (Signed)
May need to treat next time - new ones

## 2012-11-14 NOTE — Progress Notes (Signed)
Subjective:   HPI The patient presents for a follow-up of  chronic hypertension, chronic COPD, OA controlled with medicines. C/o skin growth on R temple - re grew. C/o B feet burning pains on top - gabapentin helped - on 1 a day now. C/o dizziness at times. C/o GERD - stopped omeprazole due to dizziness...  Wt Readings from Last 3 Encounters:  11/14/12 143 lb (64.864 kg)  08/24/12 139 lb (63.05 kg)  05/23/12 142 lb (64.411 kg)     BP Readings from Last 3 Encounters:  11/14/12 168/80  08/24/12 120/64  05/23/12 108/68      Review of Systems  Constitutional: Negative for appetite change, fatigue and unexpected weight change.  HENT: Negative for nosebleeds, congestion, sore throat, sneezing, trouble swallowing and neck pain.   Eyes: Negative for itching and visual disturbance.  Respiratory: Negative for cough.   Cardiovascular: Negative for chest pain, palpitations and leg swelling.  Gastrointestinal: Negative for nausea, diarrhea, blood in stool and abdominal distention.  Genitourinary: Negative for frequency and hematuria.  Musculoskeletal: Negative for back pain, joint swelling and gait problem.  Skin: Negative for rash.  Neurological: Negative for dizziness, tremors, speech difficulty and weakness.  Psychiatric/Behavioral: Positive for sleep disturbance. Negative for suicidal ideas, dysphoric mood and agitation. The patient is not nervous/anxious.        Objective:   Physical Exam  Constitutional: He is oriented to person, place, and time. He appears well-developed.  HENT:  Mouth/Throat: Oropharynx is clear and moist.  Eyes: Conjunctivae are normal. Pupils are equal, round, and reactive to light.  Neck: Normal range of motion. No JVD present. No thyromegaly present.  Cardiovascular: Normal rate, regular rhythm, normal heart sounds and intact distal pulses.  Exam reveals no gallop and no friction rub.   No murmur heard. Pulmonary/Chest: Effort normal and breath sounds  normal. No respiratory distress. He has no wheezes. He has no rales. He exhibits no tenderness.  Abdominal: Soft. Bowel sounds are normal. He exhibits no distension and no mass. There is no tenderness. There is no rebound and no guarding.  Musculoskeletal: Normal range of motion. He exhibits no edema and no tenderness.  Lymphadenopathy:    He has no cervical adenopathy.  Neurological: He is alert and oriented to person, place, and time. He has normal reflexes. No cranial nerve deficit. He exhibits normal muscle tone. Coordination normal.  Skin: Skin is warm and dry. No rash noted.  Psychiatric: He has a normal mood and affect. His behavior is normal. Judgment and thought content normal.  AKs on face Rash on chest, abd B feet w/spider veins       Lab Results  Component Value Date   WBC 8.7 05/23/2012   HGB 10.4* 05/23/2012   HCT 31.0* 05/23/2012   PLT 230.0 05/23/2012   GLUCOSE 81 10/27/2011   CHOL 164 05/23/2012   TRIG 69.0 05/23/2012   HDL 70.80 05/23/2012   LDLCALC 79 05/23/2012   ALT 15 05/23/2012   AST 17 05/23/2012   NA 137 10/27/2011   K 4.6 10/27/2011   CL 104 10/27/2011   CREATININE 1.4 10/27/2011   BUN 27* 10/27/2011   CO2 24 10/27/2011   TSH 0.95 05/23/2012   PSA 0.01* 06/23/2010   INR 1.01 09/09/2010   HGBA1C  Value: 5.6 (NOTE)  According to the ADA Clinical Practice Recommendations for 2011, when HbA1c is used as a screening test:   >=6.5%   Diagnostic of Diabetes Mellitus           (if abnormal result  is confirmed)  5.7-6.4%   Increased risk of developing Diabetes Mellitus  References:Diagnosis and Classification of Diabetes Mellitus,Diabetes Care,2011,34(Suppl 1):S62-S69 and Standards of Medical Care in         Diabetes - 2011,Diabetes Care,2011,34  (Suppl 1):S11-S61. 09/10/2010      Procedure Note :     Procedure : Cryosurgery   Indication: Actinic keratosis(es)   Risks including unsuccessful  procedure , bleeding, infection, bruising, scar, a need for a repeat  procedure and others were explained to the patient in detail as well as the benefits. Informed consent was obtained verbally.   3 new  lesion(s)  on the face  was/were treated with liquid nitrogen on a Q-tip in a usual fasion . Band-Aid was applied and antibiotic ointment was given for a later use.   Tolerated well. Complications none.   Postprocedure instructions :     Keep the wounds clean. You can wash them with liquid soap and water. Pat dry with gauze or a Kleenex tissue  Before applying antibiotic ointment and a Band-Aid.   You need to report immediately  if  any signs of infection develop.         Assessment & Plan:

## 2012-11-24 ENCOUNTER — Ambulatory Visit: Payer: Medicare Other | Admitting: Internal Medicine

## 2012-12-17 ENCOUNTER — Other Ambulatory Visit: Payer: Self-pay | Admitting: Internal Medicine

## 2013-01-24 ENCOUNTER — Encounter: Payer: Self-pay | Admitting: Internal Medicine

## 2013-01-24 ENCOUNTER — Ambulatory Visit (INDEPENDENT_AMBULATORY_CARE_PROVIDER_SITE_OTHER): Payer: Medicare Other | Admitting: Internal Medicine

## 2013-01-24 VITALS — BP 132/70 | HR 85 | Temp 97.1°F | Wt 144.0 lb

## 2013-01-24 DIAGNOSIS — S51812A Laceration without foreign body of left forearm, initial encounter: Secondary | ICD-10-CM

## 2013-01-24 DIAGNOSIS — R634 Abnormal weight loss: Secondary | ICD-10-CM

## 2013-01-24 DIAGNOSIS — S51819A Laceration without foreign body of unspecified forearm, initial encounter: Secondary | ICD-10-CM | POA: Insufficient documentation

## 2013-01-24 DIAGNOSIS — I10 Essential (primary) hypertension: Secondary | ICD-10-CM

## 2013-01-24 DIAGNOSIS — S51809A Unspecified open wound of unspecified forearm, initial encounter: Secondary | ICD-10-CM

## 2013-01-24 NOTE — Progress Notes (Signed)
C/o L forearm laceration since last Sat; still bleeding. It hurts F/u HTN, wt loss  Subjective:   HPI The patient presents for a follow-up of  chronic hypertension, chronic COPD, OA controlled with medicines. C/o skin growth on R temple - re grew. C/o B feet burning pains on top - gabapentin helped - on 1 a day now. C/o dizziness at times. C/o GERD - stopped omeprazole due to dizziness...  Wt Readings from Last 3 Encounters:  01/24/13 144 lb (65.318 kg)  11/14/12 143 lb (64.864 kg)  08/24/12 139 lb (63.05 kg)     BP Readings from Last 3 Encounters:  01/24/13 132/70  11/14/12 148/90  08/24/12 120/64      Review of Systems  Constitutional: Negative for appetite change, fatigue and unexpected weight change.  HENT: Negative for nosebleeds, congestion, sore throat, sneezing, trouble swallowing and neck pain.   Eyes: Negative for itching and visual disturbance.  Respiratory: Negative for cough.   Cardiovascular: Negative for chest pain, palpitations and leg swelling.  Gastrointestinal: Negative for nausea, diarrhea, blood in stool and abdominal distention.  Genitourinary: Negative for frequency and hematuria.  Musculoskeletal: Negative for back pain, joint swelling and gait problem.  Skin: Negative for rash.  Neurological: Negative for dizziness, tremors, speech difficulty and weakness.  Psychiatric/Behavioral: Positive for sleep disturbance. Negative for suicidal ideas, dysphoric mood and agitation. The patient is not nervous/anxious.        Objective:   Physical Exam  Constitutional: He is oriented to person, place, and time. He appears well-developed.  HENT:  Mouth/Throat: Oropharynx is clear and moist.  Eyes: Conjunctivae are normal. Pupils are equal, round, and reactive to light.  Neck: Normal range of motion. No JVD present. No thyromegaly present.  Cardiovascular: Normal rate, regular rhythm, normal heart sounds and intact distal pulses.  Exam reveals no gallop and no  friction rub.   No murmur heard. Pulmonary/Chest: Effort normal and breath sounds normal. No respiratory distress. He has no wheezes. He has no rales. He exhibits no tenderness.  Abdominal: Soft. Bowel sounds are normal. He exhibits no distension and no mass. There is no tenderness. There is no rebound and no guarding.  Musculoskeletal: Normal range of motion. He exhibits no edema and no tenderness.  Lymphadenopathy:    He has no cervical adenopathy.  Neurological: He is alert and oriented to person, place, and time. He has normal reflexes. No cranial nerve deficit. He exhibits normal muscle tone. Coordination normal.  Skin: Skin is warm and dry. No rash noted.  Psychiatric: He has a normal mood and affect. His behavior is normal. Judgment and thought content normal.  AKs on face B feet w/spider veins  L forearm laceration 6 cm  Procedure: laceration repair Indication: deep skin laceration Risks including infection, bleeding, scar formation, wound opening as well as benefits were explained to the patient in details. Wound was cleaned with betadine. 3 wide steri-strips were applied and the wound was dressed with  antibiotic ointment and Telfa pad. Coban wrap loose. Tolerated well. Complications: none.           Lab Results  Component Value Date   WBC 8.7 05/23/2012   HGB 10.4* 05/23/2012   HCT 31.0* 05/23/2012   PLT 230.0 05/23/2012   GLUCOSE 81 10/27/2011   CHOL 164 05/23/2012   TRIG 69.0 05/23/2012   HDL 70.80 05/23/2012   LDLCALC 79 05/23/2012   ALT 15 05/23/2012   AST 17 05/23/2012   NA 137 10/27/2011   K  4.6 10/27/2011   CL 104 10/27/2011   CREATININE 1.4 10/27/2011   BUN 27* 10/27/2011   CO2 24 10/27/2011   TSH 0.95 05/23/2012   PSA 0.01* 06/23/2010   INR 1.01 09/09/2010   HGBA1C  Value: 5.6 (NOTE)                                                                       According to the ADA Clinical Practice Recommendations for 2011, when HbA1c is used as a screening test:    >=6.5%   Diagnostic of Diabetes Mellitus           (if abnormal result  is confirmed)  5.7-6.4%   Increased risk of developing Diabetes Mellitus  References:Diagnosis and Classification of Diabetes Mellitus,Diabetes Care,2011,34(Suppl 1):S62-S69 and Standards of Medical Care in         Diabetes - 2011,Diabetes Care,2011,34  (Suppl 1):S11-S61. 09/10/2010        Assessment & Plan:

## 2013-01-24 NOTE — Assessment & Plan Note (Signed)
See procedure 

## 2013-01-24 NOTE — Patient Instructions (Signed)
Leave the dressing on for 2 days, then  Clean wound daily with soap and water, pat dry with a gauze or a kleenex tissue. Dress with antibiotic ointment and a band aid  Leave steri-strips on until they fall off

## 2013-01-25 NOTE — Assessment & Plan Note (Signed)
Stable

## 2013-01-25 NOTE — Assessment & Plan Note (Signed)
Continue with current prescription therapy as reflected on the Med list.  

## 2013-01-27 ENCOUNTER — Other Ambulatory Visit: Payer: Self-pay | Admitting: Internal Medicine

## 2013-01-31 ENCOUNTER — Other Ambulatory Visit: Payer: Self-pay | Admitting: Internal Medicine

## 2013-02-09 ENCOUNTER — Ambulatory Visit (INDEPENDENT_AMBULATORY_CARE_PROVIDER_SITE_OTHER): Payer: Medicare Other | Admitting: Internal Medicine

## 2013-02-09 ENCOUNTER — Encounter: Payer: Self-pay | Admitting: Internal Medicine

## 2013-02-09 VITALS — BP 180/82 | HR 80 | Temp 97.5°F | Resp 16 | Wt 141.0 lb

## 2013-02-09 DIAGNOSIS — G459 Transient cerebral ischemic attack, unspecified: Secondary | ICD-10-CM

## 2013-02-09 DIAGNOSIS — R197 Diarrhea, unspecified: Secondary | ICD-10-CM

## 2013-02-09 DIAGNOSIS — M545 Low back pain: Secondary | ICD-10-CM

## 2013-02-09 DIAGNOSIS — R21 Rash and other nonspecific skin eruption: Secondary | ICD-10-CM

## 2013-02-09 DIAGNOSIS — Z23 Encounter for immunization: Secondary | ICD-10-CM

## 2013-02-09 MED ORDER — METHYLPREDNISOLONE ACETATE 80 MG/ML IJ SUSP
80.0000 mg | Freq: Once | INTRAMUSCULAR | Status: AC
  Administered 2013-02-09: 80 mg via INTRAMUSCULAR

## 2013-02-09 NOTE — Assessment & Plan Note (Signed)
Continue with current prescription therapy as reflected on the Med list.  

## 2013-02-09 NOTE — Assessment & Plan Note (Signed)
Much better Hold vit D Depomedrol 80mg  im

## 2013-02-09 NOTE — Assessment & Plan Note (Signed)
Resolved  Wt Readings from Last 3 Encounters:  02/09/13 141 lb (63.957 kg)  01/24/13 144 lb (65.318 kg)  11/14/12 143 lb (64.864 kg)

## 2013-02-09 NOTE — Assessment & Plan Note (Signed)
Cont ASA

## 2013-02-09 NOTE — Progress Notes (Signed)
Subjective:   HPI The patient presents for a follow-up of  chronic hypertension, chronic COPD, OA controlled with medicines.  F/u rash - much better  Wt Readings from Last 3 Encounters:  02/09/13 141 lb (63.957 kg)  01/24/13 144 lb (65.318 kg)  11/14/12 143 lb (64.864 kg)     BP Readings from Last 3 Encounters:  02/09/13 180/82  01/24/13 132/70  11/14/12 148/90      Review of Systems  Constitutional: Negative for appetite change, fatigue and unexpected weight change.  HENT: Negative for nosebleeds, congestion, sore throat, sneezing, trouble swallowing and neck pain.   Eyes: Negative for itching and visual disturbance.  Respiratory: Negative for cough.   Cardiovascular: Negative for chest pain, palpitations and leg swelling.  Gastrointestinal: Negative for nausea, diarrhea, blood in stool and abdominal distention.  Genitourinary: Negative for frequency and hematuria.  Musculoskeletal: Negative for back pain, joint swelling and gait problem.  Skin: Negative for rash.  Neurological: Negative for dizziness, tremors, speech difficulty and weakness.  Psychiatric/Behavioral: Positive for sleep disturbance. Negative for suicidal ideas, dysphoric mood and agitation. The patient is not nervous/anxious.        Objective:   Physical Exam  Constitutional: He is oriented to person, place, and time. He appears well-developed.  HENT:  Mouth/Throat: Oropharynx is clear and moist.  Eyes: Conjunctivae are normal. Pupils are equal, round, and reactive to light.  Neck: Normal range of motion. No JVD present. No thyromegaly present.  Cardiovascular: Normal rate, regular rhythm, normal heart sounds and intact distal pulses.  Exam reveals no gallop and no friction rub.   No murmur heard. Pulmonary/Chest: Effort normal and breath sounds normal. No respiratory distress. He has no wheezes. He has no rales. He exhibits no tenderness.  Abdominal: Soft. Bowel sounds are normal. He exhibits no  distension and no mass. There is no tenderness. There is no rebound and no guarding.  Musculoskeletal: Normal range of motion. He exhibits no edema and no tenderness.  Lymphadenopathy:    He has no cervical adenopathy.  Neurological: He is alert and oriented to person, place, and time. He has normal reflexes. No cranial nerve deficit. He exhibits normal muscle tone. Coordination normal.  Skin: Skin is warm and dry. No rash noted.  Psychiatric: He has a normal mood and affect. His behavior is normal. Judgment and thought content normal.  faint rash on trunk B feet w/spider veins     Lab Results  Component Value Date   WBC 8.7 05/23/2012   HGB 10.4* 05/23/2012   HCT 31.0* 05/23/2012   PLT 230.0 05/23/2012   GLUCOSE 81 10/27/2011   CHOL 164 05/23/2012   TRIG 69.0 05/23/2012   HDL 70.80 05/23/2012   LDLCALC 79 05/23/2012   ALT 15 05/23/2012   AST 17 05/23/2012   NA 137 10/27/2011   K 4.6 10/27/2011   CL 104 10/27/2011   CREATININE 1.4 10/27/2011   BUN 27* 10/27/2011   CO2 24 10/27/2011   TSH 0.95 05/23/2012   PSA 0.01* 06/23/2010   INR 1.01 09/09/2010   HGBA1C  Value: 5.6 (NOTE)  According to the ADA Clinical Practice Recommendations for 2011, when HbA1c is used as a screening test:   >=6.5%   Diagnostic of Diabetes Mellitus           (if abnormal result  is confirmed)  5.7-6.4%   Increased risk of developing Diabetes Mellitus  References:Diagnosis and Classification of Diabetes Mellitus,Diabetes Care,2011,34(Suppl 1):S62-S69 and Standards of Medical Care in         Diabetes - 2011,Diabetes Care,2011,34  (Suppl 1):S11-S61. 09/10/2010           Assessment & Plan:

## 2013-02-17 ENCOUNTER — Ambulatory Visit: Payer: Medicare Other | Admitting: Internal Medicine

## 2013-05-11 ENCOUNTER — Other Ambulatory Visit (INDEPENDENT_AMBULATORY_CARE_PROVIDER_SITE_OTHER): Payer: Medicare Other

## 2013-05-11 ENCOUNTER — Ambulatory Visit (INDEPENDENT_AMBULATORY_CARE_PROVIDER_SITE_OTHER): Payer: Medicare Other | Admitting: Internal Medicine

## 2013-05-11 ENCOUNTER — Encounter: Payer: Self-pay | Admitting: Internal Medicine

## 2013-05-11 VITALS — BP 140/74 | HR 72 | Temp 97.1°F | Resp 16 | Wt 139.0 lb

## 2013-05-11 DIAGNOSIS — G459 Transient cerebral ischemic attack, unspecified: Secondary | ICD-10-CM

## 2013-05-11 DIAGNOSIS — I10 Essential (primary) hypertension: Secondary | ICD-10-CM

## 2013-05-11 DIAGNOSIS — R5383 Other fatigue: Secondary | ICD-10-CM

## 2013-05-11 DIAGNOSIS — M545 Low back pain: Secondary | ICD-10-CM

## 2013-05-11 DIAGNOSIS — L570 Actinic keratosis: Secondary | ICD-10-CM

## 2013-05-11 DIAGNOSIS — R21 Rash and other nonspecific skin eruption: Secondary | ICD-10-CM

## 2013-05-11 DIAGNOSIS — I6350 Cerebral infarction due to unspecified occlusion or stenosis of unspecified cerebral artery: Secondary | ICD-10-CM

## 2013-05-11 DIAGNOSIS — R634 Abnormal weight loss: Secondary | ICD-10-CM

## 2013-05-11 DIAGNOSIS — D649 Anemia, unspecified: Secondary | ICD-10-CM

## 2013-05-11 DIAGNOSIS — I639 Cerebral infarction, unspecified: Secondary | ICD-10-CM

## 2013-05-11 DIAGNOSIS — R5381 Other malaise: Secondary | ICD-10-CM

## 2013-05-11 DIAGNOSIS — I739 Peripheral vascular disease, unspecified: Secondary | ICD-10-CM

## 2013-05-11 LAB — IBC PANEL
Iron: 60 ug/dL (ref 42–165)
Transferrin: 224.6 mg/dL (ref 212.0–360.0)

## 2013-05-11 LAB — CBC WITH DIFFERENTIAL/PLATELET
Basophils Absolute: 0.1 10*3/uL (ref 0.0–0.1)
Eosinophils Absolute: 0.2 10*3/uL (ref 0.0–0.7)
Hemoglobin: 11.5 g/dL — ABNORMAL LOW (ref 13.0–17.0)
Lymphocytes Relative: 14.8 % (ref 12.0–46.0)
MCHC: 34.2 g/dL (ref 30.0–36.0)
Neutro Abs: 6.7 10*3/uL (ref 1.4–7.7)
Neutrophils Relative %: 74.6 % (ref 43.0–77.0)
Platelets: 241 10*3/uL (ref 150.0–400.0)
RDW: 13.7 % (ref 11.5–14.6)

## 2013-05-11 LAB — BASIC METABOLIC PANEL
Chloride: 106 mEq/L (ref 96–112)
Creatinine, Ser: 1.3 mg/dL (ref 0.4–1.5)
Sodium: 139 mEq/L (ref 135–145)

## 2013-05-11 NOTE — Assessment & Plan Note (Signed)
Getting better.

## 2013-05-11 NOTE — Progress Notes (Signed)
Pre visit review using our clinic review tool, if applicable. No additional management support is needed unless otherwise documented below in the visit note. 

## 2013-05-11 NOTE — Assessment & Plan Note (Signed)
On asa

## 2013-05-11 NOTE — Progress Notes (Signed)
Subjective:   HPI The patient presents for a follow-up of  chronic hypertension, chronic COPD, OA controlled with medicines.  F/u rash - much better. Drinking 2 beers/day  Wt Readings from Last 3 Encounters:  05/11/13 139 lb (63.05 kg)  02/09/13 141 lb (63.957 kg)  01/24/13 144 lb (65.318 kg)     BP Readings from Last 3 Encounters:  05/11/13 140/74  02/09/13 180/82  01/24/13 132/70      Review of Systems  Constitutional: Negative for appetite change, fatigue and unexpected weight change.  HENT: Negative for congestion, nosebleeds, sneezing, sore throat and trouble swallowing.   Eyes: Negative for itching and visual disturbance.  Respiratory: Negative for cough.   Cardiovascular: Negative for chest pain, palpitations and leg swelling.  Gastrointestinal: Negative for nausea, diarrhea, blood in stool and abdominal distention.  Genitourinary: Negative for frequency and hematuria.  Musculoskeletal: Negative for back pain, gait problem, joint swelling and neck pain.  Skin: Negative for rash.  Neurological: Negative for dizziness, tremors, speech difficulty and weakness.  Psychiatric/Behavioral: Positive for sleep disturbance. Negative for suicidal ideas, dysphoric mood and agitation. The patient is not nervous/anxious.        Objective:   Physical Exam  Constitutional: He is oriented to person, place, and time. He appears well-developed.  HENT:  Mouth/Throat: Oropharynx is clear and moist.  Eyes: Conjunctivae are normal. Pupils are equal, round, and reactive to light.  Neck: Normal range of motion. No JVD present. No thyromegaly present.  Cardiovascular: Normal rate, regular rhythm, normal heart sounds and intact distal pulses.  Exam reveals no gallop and no friction rub.   No murmur heard. Pulmonary/Chest: Effort normal and breath sounds normal. No respiratory distress. He has no wheezes. He has no rales. He exhibits no tenderness.  Abdominal: Soft. Bowel sounds are  normal. He exhibits no distension and no mass. There is no tenderness. There is no rebound and no guarding.  Musculoskeletal: Normal range of motion. He exhibits no edema and no tenderness.  Lymphadenopathy:    He has no cervical adenopathy.  Neurological: He is alert and oriented to person, place, and time. He has normal reflexes. No cranial nerve deficit. He exhibits normal muscle tone. Coordination normal.  Skin: Skin is warm and dry. No rash noted.  Psychiatric: He has a normal mood and affect. His behavior is normal. Judgment and thought content normal.  faint rash on trunk B feet w/spider veins  2 AKs on forehead   Lab Results  Component Value Date   WBC 8.7 05/23/2012   HGB 10.4* 05/23/2012   HCT 31.0* 05/23/2012   PLT 230.0 05/23/2012   GLUCOSE 81 10/27/2011   CHOL 164 05/23/2012   TRIG 69.0 05/23/2012   HDL 70.80 05/23/2012   LDLCALC 79 05/23/2012   ALT 15 05/23/2012   AST 17 05/23/2012   NA 137 10/27/2011   K 4.6 10/27/2011   CL 104 10/27/2011   CREATININE 1.4 10/27/2011   BUN 27* 10/27/2011   CO2 24 10/27/2011   TSH 0.95 05/23/2012   PSA 0.01* 06/23/2010   INR 1.01 09/09/2010   HGBA1C  Value: 5.6 (NOTE)  According to the ADA Clinical Practice Recommendations for 2011, when HbA1c is used as a screening test:   >=6.5%   Diagnostic of Diabetes Mellitus           (if abnormal result  is confirmed)  5.7-6.4%   Increased risk of developing Diabetes Mellitus  References:Diagnosis and Classification of Diabetes Mellitus,Diabetes Care,2011,34(Suppl 1):S62-S69 and Standards of Medical Care in         Diabetes - 2011,Diabetes Care,2011,34  (Suppl 1):S11-S61. 09/10/2010      Procedure Note :     Procedure : Cryosurgery   Indication:  Wart(s)  Actinic keratosis(es)   Risks including unsuccessful procedure , bleeding, infection, bruising, scar, a need for a repeat  procedure and others were explained to the patient in  detail as well as the benefits. Informed consent was obtained verbally.    2 lesion(s)  on L forehead   was/were treated with liquid nitrogen on a Q-tip in a usual fasion . Band-Aid was applied and antibiotic ointment was given for a later use.   Tolerated well. Complications none.   Postprocedure instructions :     Keep the wounds clean. You can wash them with liquid soap and water. Pat dry with gauze or a Kleenex tissue  Before applying antibiotic ointment and a Band-Aid.   You need to report immediately  if  any signs of infection develop.         Assessment & Plan:

## 2013-05-11 NOTE — Assessment & Plan Note (Signed)
Overall stable Wt Readings from Last 3 Encounters:  05/11/13 139 lb (63.05 kg)  02/09/13 141 lb (63.957 kg)  01/24/13 144 lb (65.318 kg)

## 2013-05-11 NOTE — Patient Instructions (Signed)
   Postprocedure instructions :     Keep the wounds clean. You can wash them with liquid soap and water. Pat dry with gauze or a Kleenex tissue  Before applying antibiotic ointment and a Band-Aid.   You need to report immediately  if  any signs of infection develop.    

## 2013-05-11 NOTE — Assessment & Plan Note (Signed)
Face - see Cryo 

## 2013-05-11 NOTE — Assessment & Plan Note (Signed)
Continue with current prescription therapy as reflected on the Med list.  

## 2013-06-07 ENCOUNTER — Telehealth: Payer: Self-pay | Admitting: *Deleted

## 2013-06-07 MED ORDER — ZOLPIDEM TARTRATE 10 MG PO TABS: 10.0000 mg | ORAL_TABLET | Freq: Every day | ORAL | Status: DC

## 2013-06-07 NOTE — Telephone Encounter (Signed)
Pt is calling requesting Rf Zolpidem 10 mg. Please advise.

## 2013-06-07 NOTE — Telephone Encounter (Signed)
Zolpidem PA is approved per Ins from 04/07/13 until 06/07/2014. Pt and pharmacy informed.

## 2013-06-07 NOTE — Telephone Encounter (Signed)
OK to fill this prescription with additional refills x3 Thank you!  

## 2013-07-16 ENCOUNTER — Other Ambulatory Visit: Payer: Self-pay | Admitting: Internal Medicine

## 2013-07-20 ENCOUNTER — Other Ambulatory Visit: Payer: Self-pay | Admitting: Internal Medicine

## 2013-07-28 ENCOUNTER — Other Ambulatory Visit: Payer: Self-pay | Admitting: *Deleted

## 2013-08-10 ENCOUNTER — Ambulatory Visit: Payer: Medicare Other | Admitting: Internal Medicine

## 2013-08-15 ENCOUNTER — Ambulatory Visit (INDEPENDENT_AMBULATORY_CARE_PROVIDER_SITE_OTHER): Payer: Medicare Other | Admitting: Internal Medicine

## 2013-08-15 ENCOUNTER — Encounter: Payer: Self-pay | Admitting: Internal Medicine

## 2013-08-15 VITALS — BP 138/72 | HR 68 | Temp 97.5°F | Resp 16 | Wt 135.0 lb

## 2013-08-15 DIAGNOSIS — Z23 Encounter for immunization: Secondary | ICD-10-CM

## 2013-08-15 MED ORDER — FLUOXETINE HCL 10 MG PO TABS: 10.0000 mg | ORAL_TABLET | Freq: Every day | ORAL | Status: DC

## 2013-08-15 NOTE — Progress Notes (Signed)
Subjective:   HPI The patient presents for a follow-up of  chronic hypertension, chronic COPD, OA controlled with medicines.  F/u rash - much better. Drinking 0 beers/day since Feb C/o LBP chronic  Wt Readings from Last 3 Encounters:  08/15/13 135 lb (61.236 kg)  05/11/13 139 lb (63.05 kg)  02/09/13 141 lb (63.957 kg)     BP Readings from Last 3 Encounters:  08/15/13 138/72  05/11/13 140/74  02/09/13 180/82      Review of Systems  Constitutional: Negative for appetite change, fatigue and unexpected weight change.  HENT: Negative for congestion, nosebleeds, sneezing, sore throat and trouble swallowing.   Eyes: Negative for itching and visual disturbance.  Respiratory: Negative for cough.   Cardiovascular: Negative for chest pain, palpitations and leg swelling.  Gastrointestinal: Negative for nausea, diarrhea, blood in stool and abdominal distention.  Genitourinary: Negative for frequency and hematuria.  Musculoskeletal: Negative for back pain, gait problem, joint swelling and neck pain.  Skin: Negative for rash.  Neurological: Negative for dizziness, tremors, speech difficulty and weakness.  Psychiatric/Behavioral: Positive for sleep disturbance and dysphoric mood. Negative for suicidal ideas and agitation. The patient is nervous/anxious.        Objective:   Physical Exam  Constitutional: He is oriented to person, place, and time. He appears well-developed.  HENT:  Mouth/Throat: Oropharynx is clear and moist.  Eyes: Conjunctivae are normal. Pupils are equal, round, and reactive to light.  Neck: Normal range of motion. No JVD present. No thyromegaly present.  Cardiovascular: Normal rate, regular rhythm, normal heart sounds and intact distal pulses.  Exam reveals no gallop and no friction rub.   No murmur heard. Pulmonary/Chest: Effort normal and breath sounds normal. No respiratory distress. He has no wheezes. He has no rales. He exhibits no tenderness.  Abdominal:  Soft. Bowel sounds are normal. He exhibits no distension and no mass. There is no tenderness. There is no rebound and no guarding.  Musculoskeletal: Normal range of motion. He exhibits no edema and no tenderness.  Lymphadenopathy:    He has no cervical adenopathy.  Neurological: He is alert and oriented to person, place, and time. He has normal reflexes. No cranial nerve deficit. He exhibits normal muscle tone. Coordination normal.  Skin: Skin is warm and dry. No rash noted.  Psychiatric: His behavior is normal. Judgment and thought content normal.  sad   B feet w/spider veins     Lab Results  Component Value Date   WBC 8.9 05/11/2013   HGB 11.5* 05/11/2013   HCT 33.5* 05/11/2013   PLT 241.0 05/11/2013   GLUCOSE 91 05/11/2013   CHOL 164 05/23/2012   TRIG 69.0 05/23/2012   HDL 70.80 05/23/2012   LDLCALC 79 05/23/2012   ALT 15 05/23/2012   AST 17 05/23/2012   NA 139 05/11/2013   K 4.6 05/11/2013   CL 106 05/11/2013   CREATININE 1.3 05/11/2013   BUN 25* 05/11/2013   CO2 24 05/11/2013   TSH 0.95 05/23/2012   PSA 0.01* 06/23/2010   INR 1.01 09/09/2010   HGBA1C  Value: 5.6 (NOTE)  According to the ADA Clinical Practice Recommendations for 2011, when HbA1c is used as a screening test:   >=6.5%   Diagnostic of Diabetes Mellitus           (if abnormal result  is confirmed)  5.7-6.4%   Increased risk of developing Diabetes Mellitus  References:Diagnosis and Classification of Diabetes Mellitus,Diabetes [id number],3299,24(QASTM 1):S62-S69 and Standards of Medical Care in         Diabetes - 2011,Diabetes [id number],2297,98  (Suppl 1):S11-S61. 09/10/2010              Assessment & Plan:

## 2013-08-15 NOTE — Progress Notes (Signed)
Pre visit review using our clinic review tool, if applicable. No additional management support is needed unless otherwise documented below in the visit note. 

## 2013-09-11 ENCOUNTER — Other Ambulatory Visit: Payer: Self-pay | Admitting: Internal Medicine

## 2013-10-18 ENCOUNTER — Ambulatory Visit: Payer: Medicare Other | Admitting: Internal Medicine

## 2013-10-18 ENCOUNTER — Telehealth: Payer: Self-pay | Admitting: *Deleted

## 2013-10-18 NOTE — Telephone Encounter (Signed)
Caller left vm stating they want pt to have CBC, Sed rate and lipids drawn. PT is scheduled to see PCP 10/19/13. They also suggest Carotid US to rule out Amaurosis. Please advise.

## 2013-10-18 NOTE — Telephone Encounter (Signed)
Keep ROV ?Thx ?

## 2013-10-19 ENCOUNTER — Other Ambulatory Visit (INDEPENDENT_AMBULATORY_CARE_PROVIDER_SITE_OTHER): Payer: Medicare Other

## 2013-10-19 ENCOUNTER — Ambulatory Visit (INDEPENDENT_AMBULATORY_CARE_PROVIDER_SITE_OTHER): Payer: Medicare Other | Admitting: Internal Medicine

## 2013-10-19 ENCOUNTER — Encounter: Payer: Self-pay | Admitting: Internal Medicine

## 2013-10-19 VITALS — BP 140/70 | HR 80 | Temp 97.0°F | Resp 16 | Wt 136.0 lb

## 2013-10-19 DIAGNOSIS — I639 Cerebral infarction, unspecified: Secondary | ICD-10-CM

## 2013-10-19 DIAGNOSIS — M545 Low back pain, unspecified: Secondary | ICD-10-CM

## 2013-10-19 DIAGNOSIS — I10 Essential (primary) hypertension: Secondary | ICD-10-CM

## 2013-10-19 DIAGNOSIS — H5461 Unqualified visual loss, right eye, normal vision left eye: Secondary | ICD-10-CM

## 2013-10-19 DIAGNOSIS — I6350 Cerebral infarction due to unspecified occlusion or stenosis of unspecified cerebral artery: Secondary | ICD-10-CM

## 2013-10-19 DIAGNOSIS — H5460 Unqualified visual loss, one eye, unspecified: Secondary | ICD-10-CM

## 2013-10-19 DIAGNOSIS — R202 Paresthesia of skin: Secondary | ICD-10-CM

## 2013-10-19 DIAGNOSIS — R209 Unspecified disturbances of skin sensation: Secondary | ICD-10-CM

## 2013-10-19 DIAGNOSIS — R634 Abnormal weight loss: Secondary | ICD-10-CM

## 2013-10-19 LAB — CBC WITH DIFFERENTIAL/PLATELET
BASOS ABS: 0.1 10*3/uL (ref 0.0–0.1)
Basophils Relative: 0.6 % (ref 0.0–3.0)
Eosinophils Absolute: 0.4 10*3/uL (ref 0.0–0.7)
Eosinophils Relative: 4.1 % (ref 0.0–5.0)
HCT: 33.5 % — ABNORMAL LOW (ref 39.0–52.0)
Hemoglobin: 11.4 g/dL — ABNORMAL LOW (ref 13.0–17.0)
LYMPHS PCT: 17.5 % (ref 12.0–46.0)
Lymphs Abs: 1.6 10*3/uL (ref 0.7–4.0)
MCHC: 33.9 g/dL (ref 30.0–36.0)
MCV: 101.9 fl — ABNORMAL HIGH (ref 78.0–100.0)
Monocytes Absolute: 0.5 10*3/uL (ref 0.1–1.0)
Monocytes Relative: 5.4 % (ref 3.0–12.0)
NEUTROS ABS: 6.5 10*3/uL (ref 1.4–7.7)
Neutrophils Relative %: 72.4 % (ref 43.0–77.0)
PLATELETS: 262 10*3/uL (ref 150.0–400.0)
RBC: 3.29 Mil/uL — ABNORMAL LOW (ref 4.22–5.81)
RDW: 14.7 % (ref 11.5–15.5)
WBC: 8.9 10*3/uL (ref 4.0–10.5)

## 2013-10-19 LAB — HEPATIC FUNCTION PANEL
ALBUMIN: 3.8 g/dL (ref 3.5–5.2)
ALT: 15 U/L (ref 0–53)
AST: 17 U/L (ref 0–37)
Alkaline Phosphatase: 55 U/L (ref 39–117)
BILIRUBIN DIRECT: 0.2 mg/dL (ref 0.0–0.3)
Total Bilirubin: 0.8 mg/dL (ref 0.2–1.2)
Total Protein: 6.4 g/dL (ref 6.0–8.3)

## 2013-10-19 LAB — LIPID PANEL
CHOL/HDL RATIO: 2
Cholesterol: 161 mg/dL (ref 0–200)
HDL: 77 mg/dL (ref >39.00)
LDL Cholesterol: 77 mg/dL (ref 0–99)
Triglycerides: 37 mg/dL (ref 0.0–149.0)
VLDL: 7.4 mg/dL (ref 0.0–40.0)

## 2013-10-19 LAB — BASIC METABOLIC PANEL
BUN: 19 mg/dL (ref 6–23)
CHLORIDE: 104 meq/L (ref 96–112)
CO2: 26 mEq/L (ref 19–32)
CREATININE: 1.2 mg/dL (ref 0.4–1.5)
Calcium: 9 mg/dL (ref 8.4–10.5)
GFR: 62.38 mL/min (ref >60.00)
Glucose, Bld: 88 mg/dL (ref 70–99)
Potassium: 4.2 mEq/L (ref 3.5–5.1)
SODIUM: 138 meq/L (ref 135–145)

## 2013-10-19 LAB — VITAMIN B12: VITAMIN B 12: 651 pg/mL (ref 211–911)

## 2013-10-19 LAB — TSH: TSH: 0.96 u[IU]/mL (ref 0.35–4.50)

## 2013-10-19 LAB — SEDIMENTATION RATE: SED RATE: 11 mm/h (ref 0–22)

## 2013-10-19 MED ORDER — CLOPIDOGREL BISULFATE 75 MG PO TABS: 75.0000 mg | ORAL_TABLET | Freq: Every day | ORAL | Status: DC

## 2013-10-19 MED ORDER — B COMPLEX PO TABS: 1.0000 | ORAL_TABLET | Freq: Every day | ORAL | Status: DC

## 2013-10-19 NOTE — Assessment & Plan Note (Addendum)
Will watch Continue with current prescription therapy as reflected on the Med list. Reduce beer to 2/d max NAS diet

## 2013-10-19 NOTE — Assessment & Plan Note (Signed)
Better  

## 2013-10-19 NOTE — Progress Notes (Signed)
Subjective:   HPI  Pt had blurred vision out of R eye 1 wk ago and saw Dr Nehemiah Massed. He wants pt to have CBC, Sed rate and lipids drawn.They also suggest Carotid US to rule out Amaurosis fugax. Vision is better.  The patient presents for a follow-up of  chronic hypertension, chronic COPD, OA controlled with medicines.  F/u rash - much better. Drinking 2-3 beers/day now F/u LBP chronic  Wt Readings from Last 3 Encounters:  10/19/13 136 lb (61.689 kg)  08/15/13 135 lb (61.236 kg)  05/11/13 139 lb (63.05 kg)     BP Readings from Last 3 Encounters:  10/19/13 140/70  08/15/13 138/72  05/11/13 140/74      Review of Systems  Constitutional: Negative for appetite change, fatigue and unexpected weight change.  HENT: Negative for congestion, nosebleeds, sneezing, sore throat and trouble swallowing.   Eyes: Negative for itching and visual disturbance.  Respiratory: Negative for cough.   Cardiovascular: Negative for chest pain, palpitations and leg swelling.  Gastrointestinal: Negative for nausea, diarrhea, blood in stool and abdominal distention.  Genitourinary: Negative for frequency and hematuria.  Musculoskeletal: Negative for back pain, gait problem, joint swelling and neck pain.  Skin: Negative for rash.  Neurological: Negative for dizziness, tremors, speech difficulty and weakness.  Psychiatric/Behavioral: Positive for sleep disturbance and dysphoric mood. Negative for suicidal ideas and agitation. The patient is nervous/anxious.        Objective:   Physical Exam  Constitutional: He is oriented to person, place, and time. He appears well-developed.  HENT:  Mouth/Throat: Oropharynx is clear and moist.  Eyes: Conjunctivae are normal. Pupils are equal, round, and reactive to light.  Neck: Normal range of motion. No JVD present. No thyromegaly present.  Cardiovascular: Normal rate, regular rhythm, normal heart sounds and intact distal pulses.  Exam reveals no gallop and no  friction rub.   No murmur heard. Pulmonary/Chest: Effort normal and breath sounds normal. No respiratory distress. He has no wheezes. He has no rales. He exhibits no tenderness.  Abdominal: Soft. Bowel sounds are normal. He exhibits no distension and no mass. There is no tenderness. There is no rebound and no guarding.  Musculoskeletal: Normal range of motion. He exhibits no edema and no tenderness.  Lymphadenopathy:    He has no cervical adenopathy.  Neurological: He is alert and oriented to person, place, and time. He has normal reflexes. No cranial nerve deficit. He exhibits normal muscle tone. Coordination normal.  Skin: Skin is warm and dry. No rash noted.  Psychiatric: His behavior is normal. Judgment and thought content normal.  sad   B feet w/spider veins     Lab Results  Component Value Date   WBC 8.9 05/11/2013   HGB 11.5* 05/11/2013   HCT 33.5* 05/11/2013   PLT 241.0 05/11/2013   GLUCOSE 91 05/11/2013   CHOL 164 05/23/2012   TRIG 69.0 05/23/2012   HDL 70.80 05/23/2012   LDLCALC 79 05/23/2012   ALT 15 05/23/2012   AST 17 05/23/2012   NA 139 05/11/2013   K 4.6 05/11/2013   CL 106 05/11/2013   CREATININE 1.3 05/11/2013   BUN 25* 05/11/2013   CO2 24 05/11/2013   TSH 0.95 05/23/2012   PSA 0.01* 06/23/2010   INR 1.01 09/09/2010   HGBA1C  Value: 5.6 (NOTE)  According to the ADA Clinical Practice Recommendations for 2011, when HbA1c is used as a screening test:   >=6.5%   Diagnostic of Diabetes Mellitus           (if abnormal result  is confirmed)  5.7-6.4%   Increased risk of developing Diabetes Mellitus  References:Diagnosis and Classification of Diabetes Mellitus,Diabetes EMLJ,4492,01(EOFHQ 1):S62-S69 and Standards of Medical Care in         Diabetes - 2011,Diabetes RFXJ,8832,54  (Suppl 1):S11-S61. 09/10/2010      a complex case        Assessment & Plan:

## 2013-10-19 NOTE — Progress Notes (Signed)
Pre visit review using our clinic review tool, if applicable. No additional management support is needed unless otherwise documented below in the visit note. 

## 2013-10-19 NOTE — Assessment & Plan Note (Signed)
Continue with current prescription therapy as reflected on the Med list.  

## 2013-10-19 NOTE — Assessment & Plan Note (Signed)
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  Moderate chronic microvascular ischemic change.  On ASA

## 2013-10-19 NOTE — Assessment & Plan Note (Addendum)
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  Moderate chronic microvascular ischemic change.  IMPRESSION:  Mild intracranial atherosclerotic disease without large vessel  occlusion.  Original Report Authenticated By: Truett Perna, M.D.  Start Plavix. D/c ASA Labs Carot doppler US

## 2013-10-26 ENCOUNTER — Other Ambulatory Visit (HOSPITAL_COMMUNITY): Payer: Self-pay | Admitting: Cardiology

## 2013-10-26 DIAGNOSIS — H53139 Sudden visual loss, unspecified eye: Secondary | ICD-10-CM

## 2013-10-30 ENCOUNTER — Ambulatory Visit (HOSPITAL_COMMUNITY): Payer: Medicare Other | Attending: Cardiovascular Disease | Admitting: Cardiology

## 2013-10-30 DIAGNOSIS — H53139 Sudden visual loss, unspecified eye: Secondary | ICD-10-CM

## 2013-10-30 DIAGNOSIS — H53129 Transient visual loss, unspecified eye: Secondary | ICD-10-CM

## 2013-10-30 DIAGNOSIS — I6529 Occlusion and stenosis of unspecified carotid artery: Secondary | ICD-10-CM | POA: Insufficient documentation

## 2013-10-30 DIAGNOSIS — H538 Other visual disturbances: Secondary | ICD-10-CM | POA: Insufficient documentation

## 2013-10-30 NOTE — Progress Notes (Signed)
Carotid duplex complete 

## 2013-11-16 ENCOUNTER — Ambulatory Visit (INDEPENDENT_AMBULATORY_CARE_PROVIDER_SITE_OTHER): Payer: Medicare Other | Admitting: Internal Medicine

## 2013-11-16 ENCOUNTER — Encounter: Payer: Self-pay | Admitting: Internal Medicine

## 2013-11-16 VITALS — BP 140/68 | HR 80 | Temp 97.4°F | Resp 16 | Wt 133.0 lb

## 2013-11-16 DIAGNOSIS — I739 Peripheral vascular disease, unspecified: Secondary | ICD-10-CM

## 2013-11-16 DIAGNOSIS — H5460 Unqualified visual loss, one eye, unspecified: Secondary | ICD-10-CM

## 2013-11-16 DIAGNOSIS — I6529 Occlusion and stenosis of unspecified carotid artery: Secondary | ICD-10-CM

## 2013-11-16 DIAGNOSIS — H5461 Unqualified visual loss, right eye, normal vision left eye: Secondary | ICD-10-CM

## 2013-11-16 DIAGNOSIS — R634 Abnormal weight loss: Secondary | ICD-10-CM

## 2013-11-16 NOTE — Assessment & Plan Note (Signed)
F/u w/Dr Nehemiah Massed - ok to have laser surgery if needed

## 2013-11-16 NOTE — Assessment & Plan Note (Addendum)
Wt Readings from Last 3 Encounters:  11/16/13 133 lb (60.328 kg)  10/19/13 136 lb (61.689 kg)  08/15/13 135 lb (61.236 kg)   Sub-optimal nutrition. Diet discussed

## 2013-11-16 NOTE — Addendum Note (Signed)
Addended by: [name] on: 11/16/2013 11:09 AM   Modules accepted: Orders

## 2013-11-16 NOTE — Assessment & Plan Note (Signed)
Continue with current prescription therapy as reflected on the Med list.  

## 2013-11-16 NOTE — Progress Notes (Signed)
Pre visit review using our clinic review tool, if applicable. No additional management support is needed unless otherwise documented below in the visit note. 

## 2013-11-16 NOTE — Progress Notes (Signed)
Subjective:   HPI  Pt had blurred vision out of R eye 1 wk ago and saw Dr Nehemiah Massed. He wants pt to have CBC, Sed rate and lipids drawn.They also suggest Carotid US to rule out Amaurosis fugax. Vision is better.  The patient presents for a follow-up of  chronic hypertension, chronic COPD, OA controlled with medicines.  F/u rash - much better. Drinking 2-3 beers/day now F/u LBP chronic  Wt Readings from Last 3 Encounters:  11/16/13 133 lb (60.328 kg)  10/19/13 136 lb (61.689 kg)  08/15/13 135 lb (61.236 kg)     BP Readings from Last 3 Encounters:  11/16/13 140/68  10/19/13 140/70  08/15/13 138/72      Review of Systems  Constitutional: Negative for appetite change, fatigue and unexpected weight change.  HENT: Negative for congestion, nosebleeds, sneezing, sore throat and trouble swallowing.   Eyes: Negative for itching and visual disturbance.  Respiratory: Negative for cough.   Cardiovascular: Negative for chest pain, palpitations and leg swelling.  Gastrointestinal: Negative for nausea, diarrhea, blood in stool and abdominal distention.  Genitourinary: Negative for frequency and hematuria.  Musculoskeletal: Negative for back pain, gait problem, joint swelling and neck pain.  Skin: Negative for rash.  Neurological: Negative for dizziness, tremors, speech difficulty and weakness.  Psychiatric/Behavioral: Positive for sleep disturbance and dysphoric mood. Negative for suicidal ideas and agitation. The patient is nervous/anxious.        Objective:   Physical Exam  Constitutional: He is oriented to person, place, and time. He appears well-developed.  HENT:  Mouth/Throat: Oropharynx is clear and moist.  Eyes: Conjunctivae are normal. Pupils are equal, round, and reactive to light.  Neck: Normal range of motion. No JVD present. No thyromegaly present.  Cardiovascular: Normal rate, regular rhythm, normal heart sounds and intact distal pulses.  Exam reveals no gallop and no  friction rub.   No murmur heard. Pulmonary/Chest: Effort normal and breath sounds normal. No respiratory distress. He has no wheezes. He has no rales. He exhibits no tenderness.  Abdominal: Soft. Bowel sounds are normal. He exhibits no distension and no mass. There is no tenderness. There is no rebound and no guarding.  Musculoskeletal: Normal range of motion. He exhibits no edema and no tenderness.  Lymphadenopathy:    He has no cervical adenopathy.  Neurological: He is alert and oriented to person, place, and time. He has normal reflexes. No cranial nerve deficit. He exhibits normal muscle tone. Coordination normal.  Skin: Skin is warm and dry. No rash noted.  Psychiatric: His behavior is normal. Judgment and thought content normal.  sad   B feet w/spider veins     Lab Results  Component Value Date   WBC 8.9 10/19/2013   HGB 11.4* 10/19/2013   HCT 33.5* 10/19/2013   PLT 262.0 10/19/2013   GLUCOSE 88 10/19/2013   CHOL 161 10/19/2013   TRIG 37.0 10/19/2013   HDL 77.00 10/19/2013   LDLCALC 77 10/19/2013   ALT 15 10/19/2013   AST 17 10/19/2013   NA 138 10/19/2013   K 4.2 10/19/2013   CL 104 10/19/2013   CREATININE 1.2 10/19/2013   BUN 19 10/19/2013   CO2 26 10/19/2013   TSH 0.96 10/19/2013   PSA 0.01* 06/23/2010   INR 1.01 09/09/2010   HGBA1C  Value: 5.6 (NOTE)  According to the ADA Clinical Practice Recommendations for 2011, when HbA1c is used as a screening test:   >=6.5%   Diagnostic of Diabetes Mellitus           (if abnormal result  is confirmed)  5.7-6.4%   Increased risk of developing Diabetes Mellitus  References:Diagnosis and Classification of Diabetes Mellitus,Diabetes PJRP,3968,86(YGEFU 1):S62-S69 and Standards of Medical Care in         Diabetes - 2011,Diabetes WTKT,8288,33  (Suppl 1):S11-S61. 09/10/2010            Assessment & Plan:

## 2014-02-06 ENCOUNTER — Encounter: Payer: Self-pay | Admitting: Gastroenterology

## 2014-02-19 ENCOUNTER — Encounter: Payer: Self-pay | Admitting: Internal Medicine

## 2014-02-19 ENCOUNTER — Ambulatory Visit (INDEPENDENT_AMBULATORY_CARE_PROVIDER_SITE_OTHER): Payer: Medicare Other | Admitting: Internal Medicine

## 2014-02-19 VITALS — BP 150/80 | HR 61 | Temp 97.6°F | Wt 137.0 lb

## 2014-02-19 DIAGNOSIS — Z23 Encounter for immunization: Secondary | ICD-10-CM

## 2014-02-19 DIAGNOSIS — R634 Abnormal weight loss: Secondary | ICD-10-CM

## 2014-02-19 DIAGNOSIS — I6529 Occlusion and stenosis of unspecified carotid artery: Secondary | ICD-10-CM

## 2014-02-19 DIAGNOSIS — L570 Actinic keratosis: Secondary | ICD-10-CM

## 2014-02-19 DIAGNOSIS — G459 Transient cerebral ischemic attack, unspecified: Secondary | ICD-10-CM

## 2014-02-19 DIAGNOSIS — I10 Essential (primary) hypertension: Secondary | ICD-10-CM

## 2014-02-19 DIAGNOSIS — I739 Peripheral vascular disease, unspecified: Secondary | ICD-10-CM

## 2014-02-19 MED ORDER — ASPIRIN EC 81 MG PO TBEC
81.0000 mg | DELAYED_RELEASE_TABLET | Freq: Every day | ORAL | Status: DC
Start: 2014-02-19 — End: 2015-07-18

## 2014-02-19 NOTE — Patient Instructions (Signed)
   Postprocedure instructions :     Keep the wounds clean. You can wash them with liquid soap and water. Pat dry with gauze or a Kleenex tissue  Before applying antibiotic ointment and a Band-Aid.   You need to report immediately  if  any signs of infection develop.    

## 2014-02-19 NOTE — Progress Notes (Deleted)
Pre visit review using our clinic review tool, if applicable. No additional management support is needed unless otherwise documented below in the visit note. 

## 2014-02-19 NOTE — Assessment & Plan Note (Signed)
Wt Readings from Last 3 Encounters:  02/19/14 137 lb (62.143 kg)  11/16/13 133 lb (60.328 kg)  10/19/13 136 lb (61.689 kg)

## 2014-02-19 NOTE — Assessment & Plan Note (Signed)
Continue with current prescription therapy as reflected on the Med list.  

## 2014-02-19 NOTE — Progress Notes (Signed)
Subjective:   HPI  Pt has blurred vision out of R eye - seeing Dr Herbert Deaner now.  C/o rash on Plavix The patient presents for a follow-up of  chronic hypertension, chronic COPD, OA controlled with medicines.  F/u rash - much better. Drinking 2-3 beers/day now F/u LBP chronic C/o lesions on face  Wt Readings from Last 3 Encounters:  02/19/14 137 lb (62.143 kg)  11/16/13 133 lb (60.328 kg)  10/19/13 136 lb (61.689 kg)     BP Readings from Last 3 Encounters:  02/19/14 150/80  11/16/13 140/68  10/19/13 140/70      Review of Systems  Constitutional: Negative for appetite change, fatigue and unexpected weight change.  HENT: Negative for congestion, nosebleeds, sneezing, sore throat and trouble swallowing.   Eyes: Negative for itching and visual disturbance.  Respiratory: Negative for cough.   Cardiovascular: Negative for chest pain, palpitations and leg swelling.  Gastrointestinal: Negative for nausea, diarrhea, blood in stool and abdominal distention.  Genitourinary: Negative for frequency and hematuria.  Musculoskeletal: Negative for back pain, gait problem, joint swelling and neck pain.  Skin: Negative for rash.  Neurological: Negative for dizziness, tremors, speech difficulty and weakness.  Psychiatric/Behavioral: Positive for sleep disturbance and dysphoric mood. Negative for suicidal ideas and agitation. The patient is nervous/anxious.        Objective:   Physical Exam  Constitutional: He is oriented to person, place, and time. He appears well-developed.  HENT:  Mouth/Throat: Oropharynx is clear and moist.  Eyes: Conjunctivae are normal. Pupils are equal, round, and reactive to light.  Neck: Normal range of motion. No JVD present. No thyromegaly present.  Cardiovascular: Normal rate, regular rhythm, normal heart sounds and intact distal pulses.  Exam reveals no gallop and no friction rub.   No murmur heard. Pulmonary/Chest: Effort normal and breath sounds normal.  No respiratory distress. He has no wheezes. He has no rales. He exhibits no tenderness.  Abdominal: Soft. Bowel sounds are normal. He exhibits no distension and no mass. There is no tenderness. There is no rebound and no guarding.  Musculoskeletal: Normal range of motion. He exhibits no edema and no tenderness.  Lymphadenopathy:    He has no cervical adenopathy.  Neurological: He is alert and oriented to person, place, and time. He has normal reflexes. No cranial nerve deficit. He exhibits normal muscle tone. Coordination normal.  Skin: Skin is warm and dry. No rash noted.  Psychiatric: His behavior is normal. Judgment and thought content normal.  sad  AKs face B feet w/spider veins     Lab Results  Component Value Date   WBC 8.9 10/19/2013   HGB 11.4* 10/19/2013   HCT 33.5* 10/19/2013   PLT 262.0 10/19/2013   GLUCOSE 88 10/19/2013   CHOL 161 10/19/2013   TRIG 37.0 10/19/2013   HDL 77.00 10/19/2013   LDLCALC 77 10/19/2013   ALT 15 10/19/2013   AST 17 10/19/2013   NA 138 10/19/2013   K 4.2 10/19/2013   CL 104 10/19/2013   CREATININE 1.2 10/19/2013   BUN 19 10/19/2013   CO2 26 10/19/2013   TSH 0.96 10/19/2013   PSA 0.01* 06/23/2010   INR 1.01 09/09/2010   HGBA1C  Value: 5.6 (NOTE)  According to the ADA Clinical Practice Recommendations for 2011, when HbA1c is used as a screening test:   >=6.5%   Diagnostic of Diabetes Mellitus           (if abnormal result  is confirmed)  5.7-6.4%   Increased risk of developing Diabetes Mellitus  References:Diagnosis and Classification of Diabetes Mellitus,Diabetes Care,2011,34(Suppl 1):S62-S69 and Standards of Medical Care in         Diabetes - 2011,Diabetes QPYP,9509,32  (Suppl 1):S11-S61. 09/10/2010     Procedure Note :     Procedure : Cryosurgery   Indication: Actinic keratosis(es)   Risks including unsuccessful procedure , bleeding, infection, bruising, scar, a need for a repeat   procedure and others were explained to the patient in detail as well as the benefits. Informed consent was obtained verbally.    4 lesion(s)  on face and L ear   was/were treated with liquid nitrogen on a Q-tip in a usual fasion . Band-Aid was applied and antibiotic ointment was given for a later use.   Tolerated well. Complications none.   Postprocedure instructions :     Keep the wounds clean. You can wash them with liquid soap and water. Pat dry with gauze or a Kleenex tissue  Before applying antibiotic ointment and a Band-Aid.   You need to report immediately  if  any signs of infection develop.           Assessment & Plan:

## 2014-02-19 NOTE — Assessment & Plan Note (Signed)
R temple - cryo

## 2014-02-19 NOTE — Assessment & Plan Note (Signed)
ASA, Plavix intolerant - can tolerate a baby ASA Re-start baby ASA

## 2014-02-23 ENCOUNTER — Other Ambulatory Visit: Payer: Self-pay | Admitting: Internal Medicine

## 2014-03-21 ENCOUNTER — Other Ambulatory Visit: Payer: Self-pay

## 2014-03-21 MED ORDER — AMLODIPINE BESYLATE-VALSARTAN 5-160 MG PO TABS: ORAL_TABLET | ORAL | Status: DC

## 2014-03-22 ENCOUNTER — Other Ambulatory Visit: Payer: Self-pay | Admitting: Internal Medicine

## 2014-03-23 ENCOUNTER — Other Ambulatory Visit: Payer: Self-pay | Admitting: *Deleted

## 2014-03-23 MED ORDER — GABAPENTIN 100 MG PO CAPS: ORAL_CAPSULE | ORAL | Status: DC

## 2014-03-23 NOTE — Telephone Encounter (Signed)
Done hardcopy to robin  

## 2014-03-23 NOTE — Telephone Encounter (Signed)
Ok to Rf in PCP absence? Thanks!!

## 2014-03-23 NOTE — Telephone Encounter (Signed)
Faxed hardcopy for Zolpidem to [facility] [address] [place] 

## 2014-05-16 ENCOUNTER — Other Ambulatory Visit: Payer: Self-pay | Admitting: Internal Medicine

## 2014-05-18 NOTE — Telephone Encounter (Signed)
Pt called to check on this request, pt is hoping to get this done before Dr. Camila Li go on vacation.

## 2014-05-18 NOTE — Telephone Encounter (Signed)
OK to fill this prescription with additional refills x3 Thank you!  

## 2014-05-21 ENCOUNTER — Other Ambulatory Visit: Payer: Self-pay | Admitting: Internal Medicine

## 2014-05-21 NOTE — Telephone Encounter (Signed)
MD is out pls advise on refill...Johny Chess

## 2014-05-21 NOTE — Telephone Encounter (Signed)
Notified pharmacy spoke with Eustaquio Maize gave her md approval.../lmb

## 2014-05-23 ENCOUNTER — Ambulatory Visit: Payer: Medicare Other | Admitting: Internal Medicine

## 2014-05-30 NOTE — Telephone Encounter (Signed)
[facility]ed [facility] spoke with sara gave md approval.../lmb

## 2014-06-15 ENCOUNTER — Encounter: Payer: Self-pay | Admitting: Internal Medicine

## 2014-06-15 ENCOUNTER — Ambulatory Visit (INDEPENDENT_AMBULATORY_CARE_PROVIDER_SITE_OTHER): Payer: Medicare Other | Admitting: Internal Medicine

## 2014-06-15 VITALS — BP 140/70 | HR 75 | Temp 97.5°F | Wt 133.0 lb

## 2014-06-15 DIAGNOSIS — F1027 Alcohol dependence with alcohol-induced persisting dementia: Secondary | ICD-10-CM

## 2014-06-15 DIAGNOSIS — G5792 Unspecified mononeuropathy of left lower limb: Secondary | ICD-10-CM

## 2014-06-15 DIAGNOSIS — G5791 Unspecified mononeuropathy of right lower limb: Secondary | ICD-10-CM

## 2014-06-15 DIAGNOSIS — L570 Actinic keratosis: Secondary | ICD-10-CM

## 2014-06-15 DIAGNOSIS — G5793 Unspecified mononeuropathy of bilateral lower limbs: Secondary | ICD-10-CM

## 2014-06-15 DIAGNOSIS — R441 Visual hallucinations: Secondary | ICD-10-CM | POA: Insufficient documentation

## 2014-06-15 DIAGNOSIS — G4700 Insomnia, unspecified: Secondary | ICD-10-CM

## 2014-06-15 MED ORDER — TRAZODONE HCL 50 MG PO TABS: 25.0000 mg | ORAL_TABLET | Freq: Every evening | ORAL | Status: DC | PRN

## 2014-06-15 NOTE — Assessment & Plan Note (Signed)
x1 on R temple - see Procedure

## 2014-06-15 NOTE — Assessment & Plan Note (Addendum)
D/c Ambien Wean off beer Vit B complex Sister and son are checking on him every day He may need to move to an [place] soon No driving

## 2014-06-15 NOTE — Assessment & Plan Note (Signed)
No driving Drink <1 beer/day Stop Zolpidem (pt was taking 5 mg a day) Start Trazodone 50 mg at hs

## 2014-06-15 NOTE — Progress Notes (Signed)
Subjective:   HPI  F/u blurred vision out of R eye - seeing Dr Herbert Deaner now.  F/u rash on Plavix - not taking The patient presents for a follow-up of  chronic hypertension, chronic COPD, OA controlled with medicines.  F/u rash - much better. Drinking 2 beers/day now F/u LBP chronic C/o seeing people in the house when no one is there... They are not threatening.  They don't talk  Wt Readings from Last 3 Encounters:  06/15/14 133 lb (60.328 kg)  02/19/14 137 lb (62.143 kg)  11/16/13 133 lb (60.328 kg)     BP Readings from Last 3 Encounters:  06/15/14 140/70  02/19/14 150/80  11/16/13 140/68      Review of Systems  Constitutional: Negative for appetite change, fatigue and unexpected weight change.  HENT: Negative for congestion, nosebleeds, sneezing, sore throat and trouble swallowing.   Eyes: Negative for itching and visual disturbance.  Respiratory: Negative for cough.   Cardiovascular: Negative for chest pain, palpitations and leg swelling.  Gastrointestinal: Negative for nausea, diarrhea, blood in stool and abdominal distention.  Genitourinary: Negative for frequency and hematuria.  Musculoskeletal: Negative for back pain, joint swelling, gait problem and neck pain.  Skin: Negative for rash.  Neurological: Negative for dizziness, tremors, speech difficulty and weakness.  Psychiatric/Behavioral: Positive for sleep disturbance and dysphoric mood. Negative for suicidal ideas and agitation. The patient is nervous/anxious.        Objective:   Physical Exam  Constitutional: He is oriented to person, place, and time. He appears well-developed.  HENT:  Mouth/Throat: Oropharynx is clear and moist.  Eyes: Conjunctivae are normal. Pupils are equal, round, and reactive to light.  Neck: Normal range of motion. No JVD present. No thyromegaly present.  Cardiovascular: Normal rate, regular rhythm, normal heart sounds and intact distal pulses.  Exam reveals no gallop and no  friction rub.   No murmur heard. Pulmonary/Chest: Effort normal and breath sounds normal. No respiratory distress. He has no wheezes. He has no rales. He exhibits no tenderness.  Abdominal: Soft. Bowel sounds are normal. He exhibits no distension and no mass. There is no tenderness. There is no rebound and no guarding.  Musculoskeletal: Normal range of motion. He exhibits no edema or tenderness.  Lymphadenopathy:    He has no cervical adenopathy.  Neurological: He is alert and oriented to person, place, and time. He has normal reflexes. No cranial nerve deficit. He exhibits normal muscle tone. Coordination normal.  Skin: Skin is warm and dry. No rash noted.  Psychiatric: His behavior is normal. Judgment and thought content normal.  sad  AK face x1 R temple Rash B feet w/spider veins Alert, cooperative. "Today is Fri 13, January, 2016"     Lab Results  Component Value Date   WBC 8.9 10/19/2013   HGB 11.4* 10/19/2013   HCT 33.5* 10/19/2013   PLT 262.0 10/19/2013   GLUCOSE 88 10/19/2013   CHOL 161 10/19/2013   TRIG 37.0 10/19/2013   HDL 77.00 10/19/2013   LDLCALC 77 10/19/2013   ALT 15 10/19/2013   AST 17 10/19/2013   NA 138 10/19/2013   K 4.2 10/19/2013   CL 104 10/19/2013   CREATININE 1.2 10/19/2013   BUN 19 10/19/2013   CO2 26 10/19/2013   TSH 0.96 10/19/2013   PSA 0.01* 06/23/2010   INR 1.01 09/09/2010   HGBA1C  09/10/2010    5.6 (NOTE)  According to the ADA Clinical Practice Recommendations for 2011, when HbA1c is used as a screening test:   >=6.5%   Diagnostic of Diabetes Mellitus           (if abnormal result  is confirmed)  5.7-6.4%   Increased risk of developing Diabetes Mellitus  References:Diagnosis and Classification of Diabetes Mellitus,Diabetes QVZD,6387,56(EPPIR 1):S62-S69 and Standards of Medical Care in         Diabetes - 2011,Diabetes JJOA,4166,06  (Suppl 1):S11-S61.    Procedure Note :      Procedure : Cryosurgery   Indication:   Actinic keratosis(es)   Risks including unsuccessful procedure , bleeding, infection, bruising, scar, a need for a repeat  procedure and others were explained to the patient in detail as well as the benefits. Informed consent was obtained verbally.    1 lesion(s)  On R temple  was/were treated with liquid nitrogen on a Q-tip in a usual fasion . Band-Aid was applied and antibiotic ointment was given for a later use.   Tolerated well. Complications none.   Postprocedure instructions :     Keep the wounds clean. You can wash them with liquid soap and water. Pat dry with gauze or a Kleenex tissue  Before applying antibiotic ointment and a Band-Aid.   You need to report immediately  if  any signs of infection develop.        Assessment & Plan:

## 2014-06-15 NOTE — Assessment & Plan Note (Signed)
Much better on Gabapentin

## 2014-06-15 NOTE — Assessment & Plan Note (Signed)
Stop Zolpidem (pt was taking 5 mg a day) Start Trazodone 50 mg at hs

## 2014-06-15 NOTE — Patient Instructions (Addendum)
Stop Ambien Start Trazodone at night for sleep    Postprocedure instructions :     Keep the wounds clean. You can wash them with liquid soap and water. Pat dry with gauze or a Kleenex tissue  Before applying antibiotic ointment and a Band-Aid.   You need to report immediately  if  any signs of infection develop.

## 2014-06-15 NOTE — Progress Notes (Signed)
Pre visit review using our clinic review tool, if applicable. No additional management support is needed unless otherwise documented below in the visit note. 

## 2014-09-14 ENCOUNTER — Encounter: Payer: Self-pay | Admitting: Internal Medicine

## 2014-09-14 ENCOUNTER — Ambulatory Visit (INDEPENDENT_AMBULATORY_CARE_PROVIDER_SITE_OTHER): Payer: Medicare Other | Admitting: Internal Medicine

## 2014-09-14 VITALS — BP 120/62 | HR 71 | Wt 136.0 lb

## 2014-09-14 DIAGNOSIS — G629 Polyneuropathy, unspecified: Secondary | ICD-10-CM | POA: Diagnosis not present

## 2014-09-14 DIAGNOSIS — G459 Transient cerebral ischemic attack, unspecified: Secondary | ICD-10-CM | POA: Diagnosis not present

## 2014-09-14 DIAGNOSIS — N393 Stress incontinence (female) (male): Secondary | ICD-10-CM

## 2014-09-14 DIAGNOSIS — R634 Abnormal weight loss: Secondary | ICD-10-CM

## 2014-09-14 DIAGNOSIS — I10 Essential (primary) hypertension: Secondary | ICD-10-CM | POA: Diagnosis not present

## 2014-09-14 DIAGNOSIS — G4700 Insomnia, unspecified: Secondary | ICD-10-CM

## 2014-09-14 MED ORDER — DEPEND OVERNIGHT BRIEFS MEDIUM MISC: Status: AC

## 2014-09-14 MED ORDER — TRAZODONE HCL 50 MG PO TABS
50.0000 mg | ORAL_TABLET | Freq: Every evening | ORAL | Status: DC | PRN
Start: 2014-09-14 — End: 2015-03-26

## 2014-09-14 NOTE — Assessment & Plan Note (Signed)
Depends Rx

## 2014-09-14 NOTE — Assessment & Plan Note (Signed)
Chronic On Exforge

## 2014-09-14 NOTE — Progress Notes (Signed)
Subjective:   HPI  F/u blurred vision out of R eye - seeing Dr Herbert Deaner now. Doing OK. The patient presents for a follow-up of  chronic hypertension, chronic COPD, OA controlled with medicines.   Pt is drinking 2 beers/day now F/u LBP chronic C/o urinary leakage - chronic   Wt Readings from Last 3 Encounters:  09/14/14 136 lb (61.689 kg)  06/15/14 133 lb (60.328 kg)  02/19/14 137 lb (62.143 kg)     BP Readings from Last 3 Encounters:  09/14/14 120/62  06/15/14 140/70  02/19/14 150/80      Review of Systems  Constitutional: Negative for appetite change, fatigue and unexpected weight change.  HENT: Negative for congestion, nosebleeds, sneezing, sore throat and trouble swallowing.   Eyes: Negative for itching and visual disturbance.  Respiratory: Negative for cough.   Cardiovascular: Negative for chest pain, palpitations and leg swelling.  Gastrointestinal: Negative for nausea, diarrhea, blood in stool and abdominal distention.  Genitourinary: Negative for frequency and hematuria.  Musculoskeletal: Negative for back pain, joint swelling, gait problem and neck pain.  Skin: Negative for rash.  Neurological: Negative for dizziness, tremors, speech difficulty and weakness.  Psychiatric/Behavioral: Positive for sleep disturbance and dysphoric mood. Negative for suicidal ideas and agitation. The patient is nervous/anxious.        Objective:   Physical Exam  Constitutional: He is oriented to person, place, and time. He appears well-developed.  HENT:  Mouth/Throat: Oropharynx is clear and moist.  Eyes: Conjunctivae are normal. Pupils are equal, round, and reactive to light.  Neck: Normal range of motion. No JVD present. No thyromegaly present.  Cardiovascular: Normal rate, regular rhythm, normal heart sounds and intact distal pulses.  Exam reveals no gallop and no friction rub.   No murmur heard. Pulmonary/Chest: Effort normal and breath sounds normal. No respiratory  distress. He has no wheezes. He has no rales. He exhibits no tenderness.  Abdominal: Soft. Bowel sounds are normal. He exhibits no distension and no mass. There is no tenderness. There is no rebound and no guarding.  Musculoskeletal: Normal range of motion. He exhibits no edema or tenderness.  Lymphadenopathy:    He has no cervical adenopathy.  Neurological: He is alert and oriented to person, place, and time. He has normal reflexes. No cranial nerve deficit. He exhibits normal muscle tone. Coordination normal.  Skin: Skin is warm and dry. No rash noted.  Psychiatric: His behavior is normal. Judgment and thought content normal.  sad   B feet w/spider veins Alert, cooperative     Lab Results  Component Value Date   WBC 8.9 10/19/2013   HGB 11.4* 10/19/2013   HCT 33.5* 10/19/2013   PLT 262.0 10/19/2013   GLUCOSE 88 10/19/2013   CHOL 161 10/19/2013   TRIG 37.0 10/19/2013   HDL 77.00 10/19/2013   LDLCALC 77 10/19/2013   ALT 15 10/19/2013   AST 17 10/19/2013   NA 138 10/19/2013   K 4.2 10/19/2013   CL 104 10/19/2013   CREATININE 1.2 10/19/2013   BUN 19 10/19/2013   CO2 26 10/19/2013   TSH 0.96 10/19/2013   PSA 0.01* 06/23/2010   INR 1.01 09/09/2010   HGBA1C  09/10/2010    5.6 (NOTE)  According to the ADA Clinical Practice Recommendations for 2011, when HbA1c is used as a screening test:   >=6.5%   Diagnostic of Diabetes Mellitus           (if abnormal result  is confirmed)  5.7-6.4%   Increased risk of developing Diabetes Mellitus  References:Diagnosis and Classification of Diabetes Mellitus,Diabetes [facility],9470,96(GEZMO 1):S62-S69 and Standards of Medical Care in         Diabetes - 2011,Diabetes QHUT,6546,50  (Suppl 1):S11-S61.          Assessment & Plan:

## 2014-09-14 NOTE — Assessment & Plan Note (Signed)
Wt Readings from Last 3 Encounters:  09/14/14 136 lb (61.689 kg)  06/15/14 133 lb (60.328 kg)  02/19/14 137 lb (62.143 kg)

## 2014-09-14 NOTE — Assessment & Plan Note (Signed)
Chronic  Potential benefits of a long term Trazadone use as well as potential risks  and complications were explained to the patient and were aknowledged. Will increase Trazodone 1 tab (not 1/2)

## 2014-09-14 NOTE — Assessment & Plan Note (Signed)
No relapse 

## 2014-09-14 NOTE — Progress Notes (Signed)
Pre visit review using our clinic review tool, if applicable. No additional management support is needed unless otherwise documented below in the visit note. 

## 2014-09-14 NOTE — Assessment & Plan Note (Signed)
B LE ?etiology On Gabapentin

## 2014-11-12 ENCOUNTER — Ambulatory Visit (INDEPENDENT_AMBULATORY_CARE_PROVIDER_SITE_OTHER): Payer: Medicare Other | Admitting: Internal Medicine

## 2014-11-12 ENCOUNTER — Other Ambulatory Visit (INDEPENDENT_AMBULATORY_CARE_PROVIDER_SITE_OTHER): Payer: Medicare Other

## 2014-11-12 ENCOUNTER — Encounter: Payer: Self-pay | Admitting: Internal Medicine

## 2014-11-12 VITALS — BP 130/68 | HR 69 | Temp 97.4°F | Wt 135.0 lb

## 2014-11-12 DIAGNOSIS — I10 Essential (primary) hypertension: Secondary | ICD-10-CM

## 2014-11-12 DIAGNOSIS — R634 Abnormal weight loss: Secondary | ICD-10-CM

## 2014-11-12 DIAGNOSIS — G5792 Unspecified mononeuropathy of left lower limb: Secondary | ICD-10-CM

## 2014-11-12 DIAGNOSIS — G5791 Unspecified mononeuropathy of right lower limb: Secondary | ICD-10-CM | POA: Diagnosis not present

## 2014-11-12 DIAGNOSIS — R202 Paresthesia of skin: Secondary | ICD-10-CM

## 2014-11-12 DIAGNOSIS — G5793 Unspecified mononeuropathy of bilateral lower limbs: Secondary | ICD-10-CM

## 2014-11-12 LAB — CBC WITH DIFFERENTIAL/PLATELET
Basophils Absolute: 0 10*3/uL (ref 0.0–0.1)
Basophils Relative: 0.3 % (ref 0.0–3.0)
EOS PCT: 2.3 % (ref 0.0–5.0)
Eosinophils Absolute: 0.2 10*3/uL (ref 0.0–0.7)
HCT: 33.2 % — ABNORMAL LOW (ref 39.0–52.0)
Hemoglobin: 11.2 g/dL — ABNORMAL LOW (ref 13.0–17.0)
Lymphocytes Relative: 16.4 % (ref 12.0–46.0)
Lymphs Abs: 1.7 10*3/uL (ref 0.7–4.0)
MCHC: 33.8 g/dL (ref 30.0–36.0)
MCV: 100.1 fl — ABNORMAL HIGH (ref 78.0–100.0)
MONO ABS: 0.7 10*3/uL (ref 0.1–1.0)
Monocytes Relative: 6.3 % (ref 3.0–12.0)
NEUTROS PCT: 74.7 % (ref 43.0–77.0)
Neutro Abs: 7.7 10*3/uL (ref 1.4–7.7)
Platelets: 250 10*3/uL (ref 150.0–400.0)
RBC: 3.31 Mil/uL — ABNORMAL LOW (ref 4.22–5.81)
RDW: 14.3 % (ref 11.5–15.5)
WBC: 10.3 10*3/uL (ref 4.0–10.5)

## 2014-11-12 LAB — BASIC METABOLIC PANEL
BUN: 29 mg/dL — AB (ref 6–23)
CHLORIDE: 103 meq/L (ref 96–112)
CO2: 24 meq/L (ref 19–32)
Calcium: 9.4 mg/dL (ref 8.4–10.5)
Creatinine, Ser: 1.38 mg/dL (ref 0.40–1.50)
GFR: 51.94 mL/min — ABNORMAL LOW (ref >60.00)
Glucose, Bld: 104 mg/dL — ABNORMAL HIGH (ref 70–99)
POTASSIUM: 4.4 meq/L (ref 3.5–5.1)
Sodium: 135 mEq/L (ref 135–145)

## 2014-11-12 LAB — TSH: TSH: 1.09 u[IU]/mL (ref 0.35–4.50)

## 2014-11-12 LAB — VITAMIN B12: VITAMIN B 12: 909 pg/mL (ref 211–911)

## 2014-11-12 LAB — HEPATIC FUNCTION PANEL
ALK PHOS: 76 U/L (ref 39–117)
ALT: 12 U/L (ref 0–53)
AST: 17 U/L (ref 0–37)
Albumin: 4.4 g/dL (ref 3.5–5.2)
Bilirubin, Direct: 0.1 mg/dL (ref 0.0–0.3)
Total Bilirubin: 0.6 mg/dL (ref 0.2–1.2)
Total Protein: 6.9 g/dL (ref 6.0–8.3)

## 2014-11-12 NOTE — Progress Notes (Signed)
Pre visit review using our clinic review tool, if applicable. No additional management support is needed unless otherwise documented below in the visit note. 

## 2014-11-12 NOTE — Progress Notes (Signed)
Subjective:   HPI  C/o L big toe pain F/u blurred vision out of R eye - seeing Dr Herbert Deaner now. Doing OK. The patient presents for a follow-up of  chronic hypertension, chronic COPD, OA controlled with medicines.   Pt is drinking 2 beers/day now F/u LBP chronic C/o urinary leakage - chronic   Wt Readings from Last 3 Encounters:  11/12/14 135 lb (61.236 kg)  09/14/14 136 lb (61.689 kg)  06/15/14 133 lb (60.328 kg)     BP Readings from Last 3 Encounters:  11/12/14 130/68  09/14/14 120/62  06/15/14 140/70      Review of Systems  Constitutional: Negative for appetite change, fatigue and unexpected weight change.  HENT: Negative for congestion, nosebleeds, sneezing, sore throat and trouble swallowing.   Eyes: Negative for itching and visual disturbance.  Respiratory: Negative for cough.   Cardiovascular: Negative for chest pain, palpitations and leg swelling.  Gastrointestinal: Negative for nausea, diarrhea, blood in stool and abdominal distention.  Genitourinary: Negative for frequency and hematuria.  Musculoskeletal: Negative for back pain, joint swelling, gait problem and neck pain.  Skin: Negative for rash.  Neurological: Negative for dizziness, tremors, speech difficulty and weakness.  Psychiatric/Behavioral: Positive for sleep disturbance and dysphoric mood. Negative for suicidal ideas and agitation. The patient is nervous/anxious.        Objective:   Physical Exam  Constitutional: He is oriented to person, place, and time. He appears well-developed.  HENT:  Mouth/Throat: Oropharynx is clear and moist.  Eyes: Conjunctivae are normal. Pupils are equal, round, and reactive to light.  Neck: Normal range of motion. No JVD present. No thyromegaly present.  Cardiovascular: Normal rate, regular rhythm, normal heart sounds and intact distal pulses.  Exam reveals no gallop and no friction rub.   No murmur heard. Pulmonary/Chest: Effort normal and breath sounds normal. No  respiratory distress. He has no wheezes. He has no rales. He exhibits no tenderness.  Abdominal: Soft. Bowel sounds are normal. He exhibits no distension and no mass. There is no tenderness. There is no rebound and no guarding.  Musculoskeletal: Normal range of motion. He exhibits no edema or tenderness.  Lymphadenopathy:    He has no cervical adenopathy.  Neurological: He is alert and oriented to person, place, and time. He has normal reflexes. No cranial nerve deficit. He exhibits normal muscle tone. Coordination normal.  Skin: Skin is warm and dry. No rash noted.  Psychiatric: His behavior is normal. Judgment and thought content normal.  sad   B feet w/spider veins, toes NT, long nails, thick Alert, cooperative  Nails trimmed by me    Lab Results  Component Value Date   WBC 8.9 10/19/2013   HGB 11.4* 10/19/2013   HCT 33.5* 10/19/2013   PLT 262.0 10/19/2013   GLUCOSE 88 10/19/2013   CHOL 161 10/19/2013   TRIG 37.0 10/19/2013   HDL 77.00 10/19/2013   LDLCALC 77 10/19/2013   ALT 15 10/19/2013   AST 17 10/19/2013   NA 138 10/19/2013   K 4.2 10/19/2013   CL 104 10/19/2013   CREATININE 1.2 10/19/2013   BUN 19 10/19/2013   CO2 26 10/19/2013   TSH 0.96 10/19/2013   PSA 0.01* 06/23/2010   INR 1.01 09/09/2010   HGBA1C  09/10/2010    5.6 (NOTE)  According to the ADA Clinical Practice Recommendations for 2011, when HbA1c is used as a screening test:   >=6.5%   Diagnostic of Diabetes Mellitus           (if abnormal result  is confirmed)  5.7-6.4%   Increased risk of developing Diabetes Mellitus  References:Diagnosis and Classification of Diabetes Mellitus,Diabetes SCBI,3779,39(SUGAY 1):S62-S69 and Standards of Medical Care in         Diabetes - 2011,Diabetes GEFU,0721,82  (Suppl 1):S11-S61.          Assessment & Plan:

## 2014-11-12 NOTE — Assessment & Plan Note (Signed)
?  etiology 5/13 - possibly alcoholic Much better on Gabapentin Will increase Trazodone

## 2014-11-12 NOTE — Assessment & Plan Note (Signed)
Labs ASA

## 2014-11-12 NOTE — Assessment & Plan Note (Signed)
Labs On Exforge

## 2014-11-12 NOTE — Assessment & Plan Note (Signed)
Wt Readings from Last 3 Encounters:  11/12/14 135 lb (61.236 kg)  09/14/14 136 lb (61.689 kg)  06/15/14 133 lb (60.328 kg)

## 2015-01-16 ENCOUNTER — Encounter: Payer: Self-pay | Admitting: Internal Medicine

## 2015-01-16 ENCOUNTER — Ambulatory Visit (INDEPENDENT_AMBULATORY_CARE_PROVIDER_SITE_OTHER): Payer: Medicare Other | Admitting: Internal Medicine

## 2015-01-16 VITALS — BP 122/70 | HR 68 | Temp 97.4°F | Resp 16 | Wt 132.0 lb

## 2015-01-16 DIAGNOSIS — G5792 Unspecified mononeuropathy of left lower limb: Secondary | ICD-10-CM

## 2015-01-16 DIAGNOSIS — B351 Tinea unguium: Secondary | ICD-10-CM | POA: Insufficient documentation

## 2015-01-16 DIAGNOSIS — G5791 Unspecified mononeuropathy of right lower limb: Secondary | ICD-10-CM | POA: Diagnosis not present

## 2015-01-16 DIAGNOSIS — F1027 Alcohol dependence with alcohol-induced persisting dementia: Secondary | ICD-10-CM

## 2015-01-16 DIAGNOSIS — G4700 Insomnia, unspecified: Secondary | ICD-10-CM

## 2015-01-16 DIAGNOSIS — I10 Essential (primary) hypertension: Secondary | ICD-10-CM

## 2015-01-16 DIAGNOSIS — G5793 Unspecified mononeuropathy of bilateral lower limbs: Secondary | ICD-10-CM

## 2015-01-16 NOTE — Assessment & Plan Note (Signed)
Trimmed toenils x10

## 2015-01-16 NOTE — Assessment & Plan Note (Addendum)
Chronic He is taking 1/2 Trazodone - not helping: take 1 tab qhs  Potential benefits of a long term Trazadone use as well as potential risks  and complications were explained to the patient and were aknowledged.

## 2015-01-16 NOTE — Assessment & Plan Note (Signed)
On Exforge 

## 2015-01-16 NOTE — Assessment & Plan Note (Signed)
Doing fair overall

## 2015-01-16 NOTE — Progress Notes (Signed)
Pre visit review using our clinic review tool, if applicable. No additional management support is needed unless otherwise documented below in the visit note. 

## 2015-01-16 NOTE — Progress Notes (Signed)
Subjective:  Patient ID: Raymond Medina, male    DOB: Aug 17, 1926  Age: 81 y.o. MRN: [id number]  CC: No chief complaint on file.   HPI Raymond Medina presents for HTN, insomnia, depression f/u. C/o achy feet, long toenails  Outpatient Prescriptions Prior to Visit  Medication Sig Dispense Refill  . amLODipine-valsartan (EXFORGE) 5-160 MG per tablet TAKE 1 TABLET BY MOUTH EVERY DAY 30 tablet 5  . aspirin EC 81 MG tablet Take 1 tablet (81 mg total) by mouth daily. 100 tablet 3  . b complex vitamins tablet Take 1 tablet by mouth daily. 100 tablet 3  . Cholecalciferol (VITAMIN D3) 1000 UNITS CAPS Take by mouth daily.      Marland Kitchen FLUoxetine (PROZAC) 10 MG tablet Take 1 tablet (10 mg total) by mouth daily. 30 tablet 5  . gabapentin (NEURONTIN) 100 MG capsule TAKE 1-2 CAPSULES BY MOUTH AT BEDTIME FOR FEET BURNING 180 capsule 2  . Incontinence Supply Disposable (DEPEND OVERNIGHT BRIEFS MEDIUM) MISC Use bid 100 each 11  . Omega-3 Fatty Acids (FISH OIL) 1000 MG CPDR Take by mouth daily.      . polyethylene glycol powder (GLYCOLAX/MIRALAX) powder 1 SCOOP ONCE DAILY AS NEEDED FOR CONSTIPATION 527 g 2  . traZODone (DESYREL) 50 MG tablet Take 1 tablet (50 mg total) by mouth at bedtime as needed for sleep. 30 tablet 5  . triamcinolone cream (KENALOG) 0.5 % Apply topically 2 (two) times daily. 45 g 3   Facility-Administered Medications Prior to Visit  Medication Dose Route Frequency Provider Last Rate Last Dose  . methylPREDNISolone acetate (DEPO-MEDROL) injection 20 mg  20 mg Intra-articular Once Aleksei Plotnikov V, MD        ROS Review of Systems  Constitutional: Negative for appetite change, fatigue and unexpected weight change.  HENT: Negative for congestion, nosebleeds, sneezing, sore throat and trouble swallowing.   Eyes: Negative for itching and visual disturbance.  Respiratory: Negative for cough.   Cardiovascular: Negative for chest pain, palpitations and leg swelling.  Gastrointestinal: Negative  for nausea, diarrhea, blood in stool and abdominal distention.  Genitourinary: Negative for frequency and hematuria.  Musculoskeletal: Positive for back pain. Negative for joint swelling, gait problem and neck pain.  Skin: Negative for rash.  Neurological: Positive for numbness. Negative for dizziness, tremors, speech difficulty and weakness.  Psychiatric/Behavioral: Positive for sleep disturbance and decreased concentration. Negative for suicidal ideas, behavioral problems, dysphoric mood and agitation. The patient is not nervous/anxious.     Objective:  BP 122/70 mmHg  Pulse 68  Temp(Src) 97.4 F (36.3 C) (Oral)  Resp 16  Wt 132 lb (59.875 kg)  SpO2 98%  BP Readings from Last 3 Encounters:  01/16/15 122/70  11/12/14 130/68  09/14/14 120/62    Wt Readings from Last 3 Encounters:  01/16/15 132 lb (59.875 kg)  11/12/14 135 lb (61.236 kg)  09/14/14 136 lb (61.689 kg)    Physical Exam  Constitutional: He is oriented to person, place, and time. He appears well-developed. No distress.  NAD  HENT:  Mouth/Throat: Oropharynx is clear and moist.  Eyes: Conjunctivae are normal. Pupils are equal, round, and reactive to light.  Neck: Normal range of motion. No JVD present. No thyromegaly present.  Cardiovascular: Normal rate, regular rhythm, normal heart sounds and intact distal pulses.  Exam reveals no gallop and no friction rub.   No murmur heard. Pulmonary/Chest: Effort normal and breath sounds normal. No respiratory distress. He has no wheezes. He has no rales. He exhibits  no tenderness.  Abdominal: Soft. Bowel sounds are normal. He exhibits no distension and no mass. There is no tenderness. There is no rebound and no guarding.  Musculoskeletal: Normal range of motion. He exhibits no edema or tenderness.  Lymphadenopathy:    He has no cervical adenopathy.  Neurological: He is alert and oriented to person, place, and time. He has normal reflexes. No cranial nerve deficit. He  exhibits normal muscle tone. He displays a negative Romberg sign. Coordination and gait normal.  Skin: Skin is warm and dry. No rash noted.  Psychiatric: He has a normal mood and affect. His behavior is normal. Judgment and thought content normal.  Long thick toenails  Lab Results  Component Value Date   WBC 10.3 11/12/2014   HGB 11.2* 11/12/2014   HCT 33.2* 11/12/2014   PLT 250.0 11/12/2014   GLUCOSE 104* 11/12/2014   CHOL 161 10/19/2013   TRIG 37.0 10/19/2013   HDL 77.00 10/19/2013   LDLCALC 77 10/19/2013   ALT 12 11/12/2014   AST 17 11/12/2014   NA 135 11/12/2014   K 4.4 11/12/2014   CL 103 11/12/2014   CREATININE 1.38 11/12/2014   BUN 29* 11/12/2014   CO2 24 11/12/2014   TSH 1.09 11/12/2014   PSA 0.01* 06/23/2010   INR 1.01 09/09/2010   HGBA1C  09/10/2010    5.6 (NOTE)                                                                       According to the ADA Clinical Practice Recommendations for 2011, when HbA1c is used as a screening test:   >=6.5%   Diagnostic of Diabetes Mellitus           (if abnormal result  is confirmed)  5.7-6.4%   Increased risk of developing Diabetes Mellitus  References:Diagnosis and Classification of Diabetes Mellitus,Diabetes MOQH,4765,46(TKPTW 1):S62-S69 and Standards of Medical Care in         Diabetes - 2011,Diabetes Care,2011,34  (Suppl 1):S11-S61.    Ct Head Wo Contrast  09/09/2010   *RADIOLOGY REPORT*  Clinical Data: Weakness, left facial droop, slurred speech  CT HEAD WITHOUT CONTRAST  Technique:  Contiguous axial images were obtained from the base of the skull through the vertex without contrast.  Comparison: None.  Findings: No evidence of intracranial hemorrhage or extra-axial fluid collection.  No mass lesion, mass effect, or midline shift.  No CT evidence of acute ischemia.  Right thalamic and left basal ganglia lacunar infarcts.  Subcortical white matter hypodensities, likely reflecting small vessel ischemic changes.  Mild global  cortical atrophy with resulting ventricular prominence, likely age appropriate.  Intracranial atherosclerosis.  The visualized paranasal sinuses and mastoids are unopacified.  IMPRESSION: No evidence of intracranial hemorrhage. No CT evidence of acute ischemia. If there is continued clinical concern for acute ischemia, brain MRI is suggested.  Old thalamic and basal ganglia lacunar infarcts.  Small vessel ischemic changes.  Original Report Authenticated By: Julian Hy, M.D.  Mr Angiogram Head Wo Contrast  09/09/2010   *RADIOLOGY REPORT*  Clinical Data:  Stroke, weakness  MRI HEAD WITHOUT CONTRAST MRA HEAD WITHOUT CONTRAST  Technique:  Multiplanar, multiecho pulse sequences of the brain and surrounding structures were obtained without intravenous contrast. Angiographic  images of the head were obtained using MRA technique without contrast.  Comparison:  CT 09/09/2010  MRI HEAD  Findings:  Moderate chronic ischemic change in the white matter bilaterally.  Chronic lacunar infarcts in the basal ganglia and thalami bilaterally.  No significant cortical infarct.  Small focus of acute infarct in the right frontal parietal white matter.  No other acute infarct.  Negative for hemorrhage or mass lesion.  Ventricle size is normal. Paranasal sinuses are clear.  IMPRESSION: Small acute infarct right frontal parietal white matter.  Moderate chronic microvascular ischemic change.  MRA HEAD  Findings: Both vertebral arteries are patent to the basilar.  Left vertebral artery is dominant.  PICA is patent bilaterally. Superior cerebellar and posterior cerebral arteries are patent bilaterally.  Mild atherosclerotic irregularity in the posterior cerebral arteries bilaterally.  Internal carotid artery is patent bilaterally with atherosclerotic irregularity in the cavernous segment bilaterally.  Anterior and middle cerebral arteries are patent bilaterally.  Moderate stenosis left A2 segment.  Mild disease and middle cerebral  artery branches bilaterally.  No large vessel occlusion or aneurysm.  IMPRESSION: Mild intracranial atherosclerotic disease without large vessel occlusion.  Original Report Authenticated By: Truett Perna, M.D.  Mr Brain Wo Contrast  09/09/2010   *RADIOLOGY REPORT*  Clinical Data:  Stroke, weakness  MRI HEAD WITHOUT CONTRAST MRA HEAD WITHOUT CONTRAST  Technique:  Multiplanar, multiecho pulse sequences of the brain and surrounding structures were obtained without intravenous contrast. Angiographic images of the head were obtained using MRA technique without contrast.  Comparison:  CT 09/09/2010  MRI HEAD  Findings:  Moderate chronic ischemic change in the white matter bilaterally.  Chronic lacunar infarcts in the basal ganglia and thalami bilaterally.  No significant cortical infarct.  Small focus of acute infarct in the right frontal parietal white matter.  No other acute infarct.  Negative for hemorrhage or mass lesion.  Ventricle size is normal. Paranasal sinuses are clear.  IMPRESSION: Small acute infarct right frontal parietal white matter.  Moderate chronic microvascular ischemic change.  MRA HEAD  Findings: Both vertebral arteries are patent to the basilar.  Left vertebral artery is dominant.  PICA is patent bilaterally. Superior cerebellar and posterior cerebral arteries are patent bilaterally.  Mild atherosclerotic irregularity in the posterior cerebral arteries bilaterally.  Internal carotid artery is patent bilaterally with atherosclerotic irregularity in the cavernous segment bilaterally.  Anterior and middle cerebral arteries are patent bilaterally.  Moderate stenosis left A2 segment.  Mild disease and middle cerebral artery branches bilaterally.  No large vessel occlusion or aneurysm.  IMPRESSION: Mild intracranial atherosclerotic disease without large vessel occlusion.  Original Report Authenticated By: Truett Perna, M.D.   Assessment & Plan:   There are no diagnoses linked to this  encounter. I am having Mr. Greenwalt maintain his Fish Oil, Vitamin D3, polyethylene glycol powder, triamcinolone cream, FLUoxetine, b complex vitamins, aspirin EC, amLODipine-valsartan, gabapentin, traZODone, and DEPEND OVERNIGHT BRIEFS MEDIUM. We will continue to administer methylPREDNISolone acetate.  No orders of the defined types were placed in this encounter.     Follow-up: No Follow-up on file.  [name], MD

## 2015-01-16 NOTE — Assessment & Plan Note (Signed)
?  etiology 5/13 - possibly alcoholic Much better on Gabapentin, Trazodone

## 2015-03-06 ENCOUNTER — Encounter: Payer: Self-pay | Admitting: Internal Medicine

## 2015-03-06 ENCOUNTER — Ambulatory Visit (INDEPENDENT_AMBULATORY_CARE_PROVIDER_SITE_OTHER): Payer: Medicare Other | Admitting: Internal Medicine

## 2015-03-06 VITALS — BP 118/58 | HR 67 | Wt 130.0 lb

## 2015-03-06 DIAGNOSIS — F1097 Alcohol use, unspecified with alcohol-induced persisting dementia: Secondary | ICD-10-CM | POA: Diagnosis not present

## 2015-03-06 DIAGNOSIS — Z23 Encounter for immunization: Secondary | ICD-10-CM | POA: Diagnosis not present

## 2015-03-06 DIAGNOSIS — G459 Transient cerebral ischemic attack, unspecified: Secondary | ICD-10-CM

## 2015-03-06 DIAGNOSIS — M546 Pain in thoracic spine: Secondary | ICD-10-CM | POA: Insufficient documentation

## 2015-03-06 DIAGNOSIS — R634 Abnormal weight loss: Secondary | ICD-10-CM

## 2015-03-06 DIAGNOSIS — G5793 Unspecified mononeuropathy of bilateral lower limbs: Secondary | ICD-10-CM

## 2015-03-06 DIAGNOSIS — F1027 Alcohol dependence with alcohol-induced persisting dementia: Secondary | ICD-10-CM

## 2015-03-06 NOTE — Assessment & Plan Note (Signed)
on Gabapentin, Trazodone

## 2015-03-06 NOTE — Progress Notes (Signed)
Subjective:  Patient ID: Raymond Medina, male    DOB: 08-Mar-1927  Age: 81 y.o. MRN: [id number]  CC: No chief complaint on file.   HPI Raymond Medina presents for HTN, neuropathy, depression, wt loss f/up. C/o pain in the spine between the shoulder blades x 2 months. Drinking 0-1 beer/d  Outpatient Prescriptions Prior to Visit  Medication Sig Dispense Refill  . amLODipine-valsartan (EXFORGE) 5-160 MG per tablet TAKE 1 TABLET BY MOUTH EVERY DAY 30 tablet 5  . aspirin EC 81 MG tablet Take 1 tablet (81 mg total) by mouth daily. 100 tablet 3  . b complex vitamins tablet Take 1 tablet by mouth daily. 100 tablet 3  . Cholecalciferol (VITAMIN D3) 1000 UNITS CAPS Take by mouth daily.      Marland Kitchen gabapentin (NEURONTIN) 100 MG capsule TAKE 1-2 CAPSULES BY MOUTH AT BEDTIME FOR FEET BURNING 180 capsule 2  . Incontinence Supply Disposable (DEPEND OVERNIGHT BRIEFS MEDIUM) MISC Use bid 100 each 11  . Omega-3 Fatty Acids (FISH OIL) 1000 MG CPDR Take by mouth daily.      . polyethylene glycol powder (GLYCOLAX/MIRALAX) powder 1 SCOOP ONCE DAILY AS NEEDED FOR CONSTIPATION 527 g 2  . traZODone (DESYREL) 50 MG tablet Take 1 tablet (50 mg total) by mouth at bedtime as needed for sleep. 30 tablet 5  . triamcinolone cream (KENALOG) 0.5 % Apply topically 2 (two) times daily. 45 g 3  . FLUoxetine (PROZAC) 10 MG tablet Take 1 tablet (10 mg total) by mouth daily. (Patient not taking: Reported on 03/06/2015) 30 tablet 5   Facility-Administered Medications Prior to Visit  Medication Dose Route Frequency Provider Last Rate Last Dose  . methylPREDNISolone acetate (DEPO-MEDROL) injection 20 mg  20 mg Intra-articular Once Danesha Kirchoff V, MD        ROS Review of Systems  Constitutional: Positive for fatigue and unexpected weight change. Negative for appetite change.  HENT: Negative for congestion, nosebleeds, sneezing, sore throat and trouble swallowing.   Eyes: Negative for itching and visual disturbance.  Respiratory:  Negative for cough.   Cardiovascular: Negative for chest pain, palpitations and leg swelling.  Gastrointestinal: Negative for nausea, diarrhea, blood in stool and abdominal distention.  Genitourinary: Negative for frequency and hematuria.  Musculoskeletal: Positive for back pain, gait problem and neck pain. Negative for joint swelling.  Skin: Negative for rash and wound.  Neurological: Positive for light-headedness. Negative for dizziness, tremors, speech difficulty and weakness.  Psychiatric/Behavioral: Positive for sleep disturbance and decreased concentration. Negative for dysphoric mood and agitation. The patient is not nervous/anxious.     Objective:  BP 118/58 mmHg  Pulse 67  Wt 130 lb (58.968 kg)  SpO2 95%  BP Readings from Last 3 Encounters:  03/06/15 118/58  01/16/15 122/70  11/12/14 130/68    Wt Readings from Last 3 Encounters:  03/06/15 130 lb (58.968 kg)  01/16/15 132 lb (59.875 kg)  11/12/14 135 lb (61.236 kg)    Physical Exam  Constitutional: He is oriented to person, place, and time. He appears well-developed. No distress.  NAD  HENT:  Mouth/Throat: Oropharynx is clear and moist.  Eyes: Conjunctivae are normal. Pupils are equal, round, and reactive to light.  Neck: Normal range of motion. No JVD present. No thyromegaly present.  Cardiovascular: Normal rate, regular rhythm, normal heart sounds and intact distal pulses.  Exam reveals no gallop and no friction rub.   No murmur heard. Pulmonary/Chest: Effort normal and breath sounds normal. No respiratory distress. He has  no wheezes. He has no rales. He exhibits no tenderness.  Abdominal: Soft. Bowel sounds are normal. He exhibits no distension and no mass. There is no tenderness. There is no rebound and no guarding.  Musculoskeletal: Normal range of motion. He exhibits tenderness. He exhibits no edema.  Lymphadenopathy:    He has no cervical adenopathy.  Neurological: He is alert and oriented to person, place,  and time. He has normal reflexes. No cranial nerve deficit. He exhibits normal muscle tone. He displays a negative Romberg sign. Coordination abnormal. Gait normal.  Skin: Skin is warm and dry. No rash noted.  Psychiatric: Judgment normal.  flat affect Thor spine tender lat from the spine  Lab Results  Component Value Date   WBC 10.3 11/12/2014   HGB 11.2* 11/12/2014   HCT 33.2* 11/12/2014   PLT 250.0 11/12/2014   GLUCOSE 104* 11/12/2014   CHOL 161 10/19/2013   TRIG 37.0 10/19/2013   HDL 77.00 10/19/2013   LDLCALC 77 10/19/2013   ALT 12 11/12/2014   AST 17 11/12/2014   NA 135 11/12/2014   K 4.4 11/12/2014   CL 103 11/12/2014   CREATININE 1.38 11/12/2014   BUN 29* 11/12/2014   CO2 24 11/12/2014   TSH 1.09 11/12/2014   PSA 0.01* 06/23/2010   INR 1.01 09/09/2010   HGBA1C  09/10/2010    5.6 (NOTE)                                                                       According to the ADA Clinical Practice Recommendations for 2011, when HbA1c is used as a screening test:   >=6.5%   Diagnostic of Diabetes Mellitus           (if abnormal result  is confirmed)  5.7-6.4%   Increased risk of developing Diabetes Mellitus  References:Diagnosis and Classification of Diabetes Mellitus,Diabetes [id number],8756,43(PIRJJ 1):S62-S69 and Standards of Medical Care in         Diabetes - 2011,Diabetes Care,2011,34  (Suppl 1):S11-S61.    Ct Head Wo Contrast  09/09/2010   *RADIOLOGY REPORT*  Clinical Data: Weakness, left facial droop, slurred speech  CT HEAD WITHOUT CONTRAST  Technique:  Contiguous axial images were obtained from the base of the skull through the vertex without contrast.  Comparison: None.  Findings: No evidence of intracranial hemorrhage or extra-axial fluid collection.  No mass lesion, mass effect, or midline shift.  No CT evidence of acute ischemia.  Right thalamic and left basal ganglia lacunar infarcts.  Subcortical white matter hypodensities, likely reflecting small vessel ischemic  changes.  Mild global cortical atrophy with resulting ventricular prominence, likely age appropriate.  Intracranial atherosclerosis.  The visualized paranasal sinuses and mastoids are unopacified.  IMPRESSION: No evidence of intracranial hemorrhage. No CT evidence of acute ischemia. If there is continued clinical concern for acute ischemia, brain MRI is suggested.  Old thalamic and basal ganglia lacunar infarcts.  Small vessel ischemic changes.  Original Report Authenticated By: Julian Hy, M.D.  Mr Angiogram Head Wo Contrast  09/09/2010   *RADIOLOGY REPORT*  Clinical Data:  Stroke, weakness  MRI HEAD WITHOUT CONTRAST MRA HEAD WITHOUT CONTRAST  Technique:  Multiplanar, multiecho pulse sequences of the brain and surrounding structures were obtained without intravenous contrast.  Angiographic images of the head were obtained using MRA technique without contrast.  Comparison:  CT 09/09/2010  MRI HEAD  Findings:  Moderate chronic ischemic change in the white matter bilaterally.  Chronic lacunar infarcts in the basal ganglia and thalami bilaterally.  No significant cortical infarct.  Small focus of acute infarct in the right frontal parietal white matter.  No other acute infarct.  Negative for hemorrhage or mass lesion.  Ventricle size is normal. Paranasal sinuses are clear.  IMPRESSION: Small acute infarct right frontal parietal white matter.  Moderate chronic microvascular ischemic change.  MRA HEAD  Findings: Both vertebral arteries are patent to the basilar.  Left vertebral artery is dominant.  PICA is patent bilaterally. Superior cerebellar and posterior cerebral arteries are patent bilaterally.  Mild atherosclerotic irregularity in the posterior cerebral arteries bilaterally.  Internal carotid artery is patent bilaterally with atherosclerotic irregularity in the cavernous segment bilaterally.  Anterior and middle cerebral arteries are patent bilaterally.  Moderate stenosis left A2 segment.  Mild disease and  middle cerebral artery branches bilaterally.  No large vessel occlusion or aneurysm.  IMPRESSION: Mild intracranial atherosclerotic disease without large vessel occlusion.  Original Report Authenticated By: Truett Perna, M.D.  Mr Brain Wo Contrast  09/09/2010   *RADIOLOGY REPORT*  Clinical Data:  Stroke, weakness  MRI HEAD WITHOUT CONTRAST MRA HEAD WITHOUT CONTRAST  Technique:  Multiplanar, multiecho pulse sequences of the brain and surrounding structures were obtained without intravenous contrast. Angiographic images of the head were obtained using MRA technique without contrast.  Comparison:  CT 09/09/2010  MRI HEAD  Findings:  Moderate chronic ischemic change in the white matter bilaterally.  Chronic lacunar infarcts in the basal ganglia and thalami bilaterally.  No significant cortical infarct.  Small focus of acute infarct in the right frontal parietal white matter.  No other acute infarct.  Negative for hemorrhage or mass lesion.  Ventricle size is normal. Paranasal sinuses are clear.  IMPRESSION: Small acute infarct right frontal parietal white matter.  Moderate chronic microvascular ischemic change.  MRA HEAD  Findings: Both vertebral arteries are patent to the basilar.  Left vertebral artery is dominant.  PICA is patent bilaterally. Superior cerebellar and posterior cerebral arteries are patent bilaterally.  Mild atherosclerotic irregularity in the posterior cerebral arteries bilaterally.  Internal carotid artery is patent bilaterally with atherosclerotic irregularity in the cavernous segment bilaterally.  Anterior and middle cerebral arteries are patent bilaterally.  Moderate stenosis left A2 segment.  Mild disease and middle cerebral artery branches bilaterally.  No large vessel occlusion or aneurysm.  IMPRESSION: Mild intracranial atherosclerotic disease without large vessel occlusion.  Original Report Authenticated By: Truett Perna, M.D.   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 -     Cancel: amLODipine-valsartan (EXFORGE) 5-160 MG tablet; TAKE 1 TABLET BY MOUTH EVERY DAY -     Cancel: gabapentin (NEURONTIN) 100 MG capsule; TAKE 1-2 CAPSULES BY MOUTH AT BEDTIME FOR FEET BURNING   I am having Mr.  Sawhney maintain his Fish Oil, Vitamin D3, polyethylene glycol powder, triamcinolone cream, FLUoxetine, b complex vitamins, aspirin EC, amLODipine-valsartan, gabapentin, traZODone, and DEPEND OVERNIGHT BRIEFS MEDIUM. We will continue to administer methylPREDNISolone acetate.  No orders of the defined types were placed in this encounter.     Follow-up: Return in about 6 weeks (around 04/17/2015) for a follow-up visit.  [name], MD

## 2015-03-06 NOTE — Progress Notes (Signed)
Pre visit review using our clinic review tool, if applicable. No additional management support is needed unless otherwise documented below in the visit note. 

## 2015-03-06 NOTE — Assessment & Plan Note (Signed)
Ensure High calorie meals

## 2015-03-06 NOTE — Assessment & Plan Note (Signed)
Monitoring Almost no ETOH intake lately per pt

## 2015-03-06 NOTE — Assessment & Plan Note (Signed)
ASA, Plavix intolerant - can tolerate a baby ASA. No relapse

## 2015-03-06 NOTE — Patient Instructions (Addendum)
  Memory foam matrass pad

## 2015-03-06 NOTE — Assessment & Plan Note (Signed)
10/16 x 2 mo ?etiology X Cofer

## 2015-03-15 ENCOUNTER — Ambulatory Visit (INDEPENDENT_AMBULATORY_CARE_PROVIDER_SITE_OTHER)
Admission: RE | Admit: 2015-03-15 | Discharge: 2015-03-15 | Disposition: A | Discharge status: Home / Self Care | Payer: Medicare Other | Source: Ambulatory Visit | Attending: Internal Medicine | Admitting: Internal Medicine

## 2015-03-15 ENCOUNTER — Other Ambulatory Visit: Payer: Self-pay | Admitting: Internal Medicine

## 2015-03-15 DIAGNOSIS — M546 Pain in thoracic spine: Secondary | ICD-10-CM

## 2015-03-26 ENCOUNTER — Ambulatory Visit (INDEPENDENT_AMBULATORY_CARE_PROVIDER_SITE_OTHER): Payer: Medicare Other | Admitting: Family Medicine

## 2015-03-26 ENCOUNTER — Other Ambulatory Visit: Payer: Self-pay | Admitting: Internal Medicine

## 2015-03-26 VITALS — BP 114/50 | HR 70 | Temp 97.8°F | Resp 14 | Ht 67.0 in | Wt 128.0 lb

## 2015-03-26 DIAGNOSIS — R296 Repeated falls: Secondary | ICD-10-CM | POA: Diagnosis not present

## 2015-03-26 DIAGNOSIS — F329 Major depressive disorder, single episode, unspecified: Secondary | ICD-10-CM | POA: Diagnosis not present

## 2015-03-26 DIAGNOSIS — M4004 Postural kyphosis, thoracic region: Secondary | ICD-10-CM

## 2015-03-26 DIAGNOSIS — M546 Pain in thoracic spine: Secondary | ICD-10-CM | POA: Diagnosis not present

## 2015-03-26 DIAGNOSIS — F32A Depression, unspecified: Secondary | ICD-10-CM

## 2015-03-26 DIAGNOSIS — R5383 Other fatigue: Secondary | ICD-10-CM

## 2015-03-26 DIAGNOSIS — H5462 Unqualified visual loss, left eye, normal vision right eye: Secondary | ICD-10-CM

## 2015-03-26 MED ORDER — CELECOXIB 100 MG PO CAPS: 100.0000 mg | ORAL_CAPSULE | Freq: Two times a day (BID) | ORAL | Status: DC

## 2015-03-26 NOTE — Patient Instructions (Signed)
I have ordered some medicine to help your back.  Please follow up with your Dr. Alain Marion. Also, have him review your vision loss and see if a neurologist would be helpful with your falling and eyesight.

## 2015-03-26 NOTE — Progress Notes (Signed)
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  He presents with upper thoracic back pain and soreness in the context of several falls, where his legs gave out.    He has had a CVA and MRI done 4 years ago shows diffuse microvascular disease.  He has a good appetite.  He does get dyspnea on exertion.  He is not a good sleeper and requires several pills when he goes to bed.  He gets lightheaded when getting up quickly and notes a fair amount of gas.    No vomiting, abdominal pain, or chest pain.  Objective:  Elderly man in no acute distress, somewhat stooped in posture.  Dishevelled HEENT:  Very poor dental condition.  Ears:  No TM problem, some wax  Eyes:  Pupils are unequal with left pupil having almost no reaction to light Neck:  No adenopathy or bruits heard.  No thyromegaly Chest:  Clear Heart:  I/VI systolic murmur, no irregularity, no gallop Abdomen:  Soft, nontender, no masses Ext:  1+ pedal edema

## 2015-03-27 ENCOUNTER — Other Ambulatory Visit: Payer: Self-pay | Admitting: Gastroenterology

## 2015-03-27 ENCOUNTER — Other Ambulatory Visit: Payer: Self-pay | Admitting: Internal Medicine

## 2015-04-17 ENCOUNTER — Ambulatory Visit (INDEPENDENT_AMBULATORY_CARE_PROVIDER_SITE_OTHER): Payer: Medicare Other | Admitting: Internal Medicine

## 2015-04-17 ENCOUNTER — Encounter: Payer: Self-pay | Admitting: Internal Medicine

## 2015-04-17 VITALS — BP 138/64 | HR 78 | Wt 129.0 lb

## 2015-04-17 DIAGNOSIS — G4700 Insomnia, unspecified: Secondary | ICD-10-CM | POA: Diagnosis not present

## 2015-04-17 DIAGNOSIS — H01006 Unspecified blepharitis left eye, unspecified eyelid: Secondary | ICD-10-CM | POA: Diagnosis not present

## 2015-04-17 DIAGNOSIS — M545 Low back pain, unspecified: Secondary | ICD-10-CM

## 2015-04-17 DIAGNOSIS — M546 Pain in thoracic spine: Secondary | ICD-10-CM | POA: Diagnosis not present

## 2015-04-17 DIAGNOSIS — I10 Essential (primary) hypertension: Secondary | ICD-10-CM

## 2015-04-17 MED ORDER — TRAZODONE HCL 50 MG PO TABS: 25.0000 mg | ORAL_TABLET | Freq: Every evening | ORAL | Status: DC | PRN

## 2015-04-17 MED ORDER — ERYTHROMYCIN 5 MG/GM OP OINT: 1.0000 "application " | TOPICAL_OINTMENT | Freq: Every day | OPHTHALMIC | Status: DC

## 2015-04-17 NOTE — Assessment & Plan Note (Signed)
Try Trazadone again (ran out)

## 2015-04-17 NOTE — Assessment & Plan Note (Signed)
L>R Erythro oint oph qhs Rx

## 2015-04-17 NOTE — Progress Notes (Signed)
Subjective:  Patient ID: Raymond Medina, male    DOB: 09-Feb-1927  Age: 81 y.o. MRN: [id number]  CC: No chief complaint on file.   HPI Raymond Medina presents for LBP-better on Celebrex, insomnia - worse, HTN f/u  Outpatient Prescriptions Prior to Visit  Medication Sig Dispense Refill  . amLODipine-valsartan (EXFORGE) 5-160 MG tablet TAKE 1 TABLET BY MOUTH EVERY DAY 30 tablet 11  . aspirin EC 81 MG tablet Take 1 tablet (81 mg total) by mouth daily. 100 tablet 3  . b complex vitamins tablet Take 1 tablet by mouth daily. 100 tablet 3  . celecoxib (CELEBREX) 100 MG capsule Take 1 capsule (100 mg total) by mouth 2 (two) times daily. 30 capsule 1  . Cholecalciferol (VITAMIN D3) 1000 UNITS CAPS Take by mouth daily.      Marland Kitchen gabapentin (NEURONTIN) 100 MG capsule TAKE 1-2 CAPSULES BY MOUTH AT BEDTIME FOR FEET BURNING 180 capsule 2  . Incontinence Supply Disposable (DEPEND OVERNIGHT BRIEFS MEDIUM) MISC Use bid 100 each 11  . Omega-3 Fatty Acids (FISH OIL) 1000 MG CPDR Take by mouth daily.      . polyethylene glycol powder (GLYCOLAX/MIRALAX) powder 1 SCOOP ONCE DAILY AS NEEDED FOR CONSTIPATION 527 g 2  . triamcinolone cream (KENALOG) 0.5 % Apply topically 2 (two) times daily. 45 g 3   No facility-administered medications prior to visit.    ROS Review of Systems  Constitutional: Positive for fatigue and unexpected weight change. Negative for appetite change.  HENT: Negative for congestion, nosebleeds, sneezing, sore throat and trouble swallowing.   Eyes: Negative for itching and visual disturbance.  Respiratory: Negative for cough.   Cardiovascular: Negative for chest pain, palpitations and leg swelling.  Gastrointestinal: Negative for nausea, diarrhea, blood in stool and abdominal distention.  Genitourinary: Negative for frequency and hematuria.  Musculoskeletal: Positive for back pain. Negative for joint swelling, gait problem and neck pain.  Skin: Negative for rash.  Neurological: Negative for  dizziness, tremors, speech difficulty and weakness.  Psychiatric/Behavioral: Positive for sleep disturbance. Negative for dysphoric mood and agitation.    Objective:  BP 138/64 mmHg  Pulse 78  Wt 129 lb (58.514 kg)  SpO2 98%  BP Readings from Last 3 Encounters:  04/17/15 138/64  03/26/15 114/50  03/06/15 118/58    Wt Readings from Last 3 Encounters:  04/17/15 129 lb (58.514 kg)  03/26/15 128 lb (58.06 kg)  03/06/15 130 lb (58.968 kg)    Physical Exam  Constitutional: He is oriented to person, place, and time. He appears well-developed. No distress.  NAD  HENT:  Mouth/Throat: Oropharynx is clear and moist.  Eyes: Conjunctivae are normal. Pupils are equal, round, and reactive to light.  Neck: Normal range of motion. No JVD present. No thyromegaly present.  Cardiovascular: Normal rate, regular rhythm, normal heart sounds and intact distal pulses.  Exam reveals no gallop and no friction rub.   No murmur heard. Pulmonary/Chest: Effort normal and breath sounds normal. No respiratory distress. He has no wheezes. He has no rales. He exhibits no tenderness.  Abdominal: Soft. Bowel sounds are normal. He exhibits no distension and no mass. There is no tenderness. There is no rebound and no guarding.  Musculoskeletal: Normal range of motion. He exhibits tenderness. He exhibits no edema.  Lymphadenopathy:    He has no cervical adenopathy.  Neurological: He is alert and oriented to person, place, and time. He has normal reflexes. No cranial nerve deficit. He exhibits normal muscle tone. He displays  a negative Romberg sign. Coordination abnormal. Gait normal.  Skin: Skin is warm and dry. No rash noted. No pallor.  Psychiatric: His behavior is normal. Judgment and thought content normal.  sad Slow A/o/c L eye is watering, red  Lab Results  Component Value Date   WBC 10.3 11/12/2014   HGB 11.2* 11/12/2014   HCT 33.2* 11/12/2014   PLT 250.0 11/12/2014   GLUCOSE 104* 11/12/2014   CHOL  161 10/19/2013   TRIG 37.0 10/19/2013   HDL 77.00 10/19/2013   LDLCALC 77 10/19/2013   ALT 12 11/12/2014   AST 17 11/12/2014   NA 135 11/12/2014   K 4.4 11/12/2014   CL 103 11/12/2014   CREATININE 1.38 11/12/2014   BUN 29* 11/12/2014   CO2 24 11/12/2014   TSH 1.09 11/12/2014   PSA 0.01* 06/23/2010   INR 1.01 09/09/2010   HGBA1C  09/10/2010    5.6 (NOTE)                                                                       According to the ADA Clinical Practice Recommendations for 2011, when HbA1c is used as a screening test:   >=6.5%   Diagnostic of Diabetes Mellitus           (if abnormal result  is confirmed)  5.7-6.4%   Increased risk of developing Diabetes Mellitus  References:Diagnosis and Classification of Diabetes Mellitus,Diabetes [id number] 1):S62-S69 and Standards of Medical Care in         Diabetes - 2011,Diabetes Care,2011,34  (Suppl 1):S11-S61.    Dg Thoracic Spine W/swimmers  03/15/2015  CLINICAL DATA:  Upper thoracic pain for 2 months.  No known injury. EXAM: THORACIC SPINE - 3 VIEWS COMPARISON:  Chest x-Schipani 02/02/2010 FINDINGS: There is diffuse osteopenia. No fracture or malalignment. Evidence of old granulomatous disease in the right lung. IMPRESSION: No acute bony abnormality.  Diffuse osteopenia. Electronically Signed   By: Rolm Baptise M.D.   On: 03/15/2015 13:06    Assessment & Plan:   Diagnoses and all orders for this visit:  Left-sided low back pain without sciatica  Thoracic spine pain  INSOMNIA, CHRONIC  Blepharitis of eyelid of left eye  Other orders -     traZODone (DESYREL) 50 MG tablet; Take 0.5-1 tablets (25-50 mg total) by mouth at bedtime as needed for sleep. -     erythromycin [facility]) ophthalmic ointment; Place 1 application into the left eye at bedtime.   I am having Raymond Medina start on traZODone and erythromycin. I am also having him maintain his Fish Oil, Vitamin D3, polyethylene glycol powder, triamcinolone cream, b complex  vitamins, aspirin EC, DEPEND OVERNIGHT BRIEFS MEDIUM, amLODipine-valsartan, celecoxib, and gabapentin.  Meds ordered this encounter  Medications  . traZODone (DESYREL) 50 MG tablet    Sig: Take 0.5-1 tablets (25-50 mg total) by mouth at bedtime as needed for sleep.    Dispense:  30 tablet    Refill:  5  . erythromycin (ROMYCIN) ophthalmic ointment    Sig: Place 1 application into the left eye at bedtime.    Dispense:  3.5 g    Refill:  0     Follow-up: Return in about 2 months (around 06/17/2015) for a follow-up visit.  [name], MD

## 2015-04-17 NOTE — Progress Notes (Signed)
Pre visit review using our clinic review tool, if applicable. No additional management support is needed unless otherwise documented below in the visit note. 

## 2015-04-17 NOTE — Assessment & Plan Note (Signed)
On Exforge 

## 2015-04-17 NOTE — Assessment & Plan Note (Signed)
Better on Celebrex

## 2015-04-23 ENCOUNTER — Emergency Department (HOSPITAL_COMMUNITY): Payer: Medicare Other

## 2015-04-23 ENCOUNTER — Encounter (HOSPITAL_COMMUNITY): Payer: Self-pay | Admitting: *Deleted

## 2015-04-23 ENCOUNTER — Inpatient Hospital Stay (HOSPITAL_COMMUNITY)
Admission: EM | Admit: 2015-04-23 | Discharge: 2015-04-26 | DRG: 682 | Disposition: A | Discharge status: Skilled Nursing Facility | Payer: Medicare Other | Attending: Internal Medicine | Admitting: Internal Medicine

## 2015-04-23 DIAGNOSIS — I129 Hypertensive chronic kidney disease with stage 1 through stage 4 chronic kidney disease, or unspecified chronic kidney disease: Secondary | ICD-10-CM | POA: Diagnosis present

## 2015-04-23 DIAGNOSIS — Z87891 Personal history of nicotine dependence: Secondary | ICD-10-CM | POA: Diagnosis not present

## 2015-04-23 DIAGNOSIS — Y92009 Unspecified place in unspecified non-institutional (private) residence as the place of occurrence of the external cause: Secondary | ICD-10-CM

## 2015-04-23 DIAGNOSIS — J439 Emphysema, unspecified: Secondary | ICD-10-CM

## 2015-04-23 DIAGNOSIS — Z8546 Personal history of malignant neoplasm of prostate: Secondary | ICD-10-CM | POA: Diagnosis not present

## 2015-04-23 DIAGNOSIS — N400 Enlarged prostate without lower urinary tract symptoms: Secondary | ICD-10-CM | POA: Diagnosis present

## 2015-04-23 DIAGNOSIS — Y92019 Unspecified place in single-family (private) house as the place of occurrence of the external cause: Secondary | ICD-10-CM | POA: Diagnosis not present

## 2015-04-23 DIAGNOSIS — Z8673 Personal history of transient ischemic attack (TIA), and cerebral infarction without residual deficits: Secondary | ICD-10-CM | POA: Diagnosis not present

## 2015-04-23 DIAGNOSIS — I739 Peripheral vascular disease, unspecified: Secondary | ICD-10-CM | POA: Diagnosis present

## 2015-04-23 DIAGNOSIS — G4700 Insomnia, unspecified: Secondary | ICD-10-CM | POA: Diagnosis present

## 2015-04-23 DIAGNOSIS — M6282 Rhabdomyolysis: Secondary | ICD-10-CM | POA: Diagnosis present

## 2015-04-23 DIAGNOSIS — F1010 Alcohol abuse, uncomplicated: Secondary | ICD-10-CM | POA: Diagnosis present

## 2015-04-23 DIAGNOSIS — F0390 Unspecified dementia without behavioral disturbance: Secondary | ICD-10-CM | POA: Diagnosis present

## 2015-04-23 DIAGNOSIS — Z6820 Body mass index (BMI) 20.0-20.9, adult: Secondary | ICD-10-CM

## 2015-04-23 DIAGNOSIS — S60222A Contusion of left hand, initial encounter: Secondary | ICD-10-CM | POA: Diagnosis present

## 2015-04-23 DIAGNOSIS — S80211A Abrasion, right knee, initial encounter: Secondary | ICD-10-CM | POA: Diagnosis present

## 2015-04-23 DIAGNOSIS — S60221A Contusion of right hand, initial encounter: Secondary | ICD-10-CM | POA: Diagnosis present

## 2015-04-23 DIAGNOSIS — E43 Unspecified severe protein-calorie malnutrition: Secondary | ICD-10-CM | POA: Diagnosis present

## 2015-04-23 DIAGNOSIS — S40012A Contusion of left shoulder, initial encounter: Secondary | ICD-10-CM | POA: Diagnosis present

## 2015-04-23 DIAGNOSIS — R55 Syncope and collapse: Secondary | ICD-10-CM | POA: Diagnosis not present

## 2015-04-23 DIAGNOSIS — N183 Chronic kidney disease, stage 3 (moderate): Secondary | ICD-10-CM | POA: Diagnosis present

## 2015-04-23 DIAGNOSIS — F329 Major depressive disorder, single episode, unspecified: Secondary | ICD-10-CM | POA: Diagnosis present

## 2015-04-23 DIAGNOSIS — R159 Full incontinence of feces: Secondary | ICD-10-CM | POA: Diagnosis present

## 2015-04-23 DIAGNOSIS — S5002XA Contusion of left elbow, initial encounter: Secondary | ICD-10-CM | POA: Diagnosis present

## 2015-04-23 DIAGNOSIS — S40011A Contusion of right shoulder, initial encounter: Secondary | ICD-10-CM | POA: Diagnosis present

## 2015-04-23 DIAGNOSIS — F1027 Alcohol dependence with alcohol-induced persisting dementia: Secondary | ICD-10-CM | POA: Diagnosis present

## 2015-04-23 DIAGNOSIS — R52 Pain, unspecified: Secondary | ICD-10-CM

## 2015-04-23 DIAGNOSIS — W1809XA Striking against other object with subsequent fall, initial encounter: Secondary | ICD-10-CM | POA: Diagnosis present

## 2015-04-23 DIAGNOSIS — J449 Chronic obstructive pulmonary disease, unspecified: Secondary | ICD-10-CM | POA: Diagnosis present

## 2015-04-23 DIAGNOSIS — S80212A Abrasion, left knee, initial encounter: Secondary | ICD-10-CM | POA: Diagnosis present

## 2015-04-23 DIAGNOSIS — I10 Essential (primary) hypertension: Secondary | ICD-10-CM | POA: Diagnosis not present

## 2015-04-23 DIAGNOSIS — E860 Dehydration: Secondary | ICD-10-CM | POA: Diagnosis present

## 2015-04-23 DIAGNOSIS — R32 Unspecified urinary incontinence: Secondary | ICD-10-CM | POA: Diagnosis present

## 2015-04-23 DIAGNOSIS — R1310 Dysphagia, unspecified: Secondary | ICD-10-CM | POA: Diagnosis present

## 2015-04-23 DIAGNOSIS — E876 Hypokalemia: Secondary | ICD-10-CM | POA: Diagnosis present

## 2015-04-23 DIAGNOSIS — F5104 Psychophysiologic insomnia: Secondary | ICD-10-CM | POA: Diagnosis present

## 2015-04-23 DIAGNOSIS — S5001XA Contusion of right elbow, initial encounter: Secondary | ICD-10-CM | POA: Diagnosis present

## 2015-04-23 DIAGNOSIS — N179 Acute kidney failure, unspecified: Principal | ICD-10-CM | POA: Diagnosis present

## 2015-04-23 DIAGNOSIS — F1097 Alcohol use, unspecified with alcohol-induced persisting dementia: Secondary | ICD-10-CM

## 2015-04-23 DIAGNOSIS — E440 Moderate protein-calorie malnutrition: Secondary | ICD-10-CM | POA: Diagnosis not present

## 2015-04-23 DIAGNOSIS — R531 Weakness: Secondary | ICD-10-CM

## 2015-04-23 DIAGNOSIS — K219 Gastro-esophageal reflux disease without esophagitis: Secondary | ICD-10-CM | POA: Diagnosis present

## 2015-04-23 DIAGNOSIS — H01006 Unspecified blepharitis left eye, unspecified eyelid: Secondary | ICD-10-CM | POA: Diagnosis present

## 2015-04-23 DIAGNOSIS — F32A Depression, unspecified: Secondary | ICD-10-CM | POA: Diagnosis present

## 2015-04-23 DIAGNOSIS — W19XXXA Unspecified fall, initial encounter: Secondary | ICD-10-CM

## 2015-04-23 LAB — COMPREHENSIVE METABOLIC PANEL
ALBUMIN: 4 g/dL (ref 3.5–5.0)
ALT: 23 U/L (ref 17–63)
AST: 42 U/L — AB (ref 15–41)
Alkaline Phosphatase: 53 U/L (ref 38–126)
Anion gap: 16 — ABNORMAL HIGH (ref 5–15)
BILIRUBIN TOTAL: 1.4 mg/dL — AB (ref 0.3–1.2)
BUN: 50 mg/dL — AB (ref 6–20)
CHLORIDE: 110 mmol/L (ref 101–111)
CO2: 20 mmol/L — ABNORMAL LOW (ref 22–32)
CREATININE: 2.05 mg/dL — AB (ref 0.61–1.24)
Calcium: 9.5 mg/dL (ref 8.9–10.3)
GFR calc Af Amer: 32 mL/min — ABNORMAL LOW (ref >60)
GFR, EST NON AFRICAN AMERICAN: 28 mL/min — AB (ref >60)
GLUCOSE: 106 mg/dL — AB (ref 65–99)
Potassium: 3.8 mmol/L (ref 3.5–5.1)
Sodium: 146 mmol/L — ABNORMAL HIGH (ref 135–145)
TOTAL PROTEIN: 6.1 g/dL — AB (ref 6.5–8.1)

## 2015-04-23 LAB — I-STAT TROPONIN, ED: TROPONIN I, POC: 0.51 ng/mL — AB (ref 0.00–0.08)

## 2015-04-23 LAB — CBC WITH DIFFERENTIAL/PLATELET
Basophils Absolute: 0 10*3/uL (ref 0.0–0.1)
Basophils Relative: 0 %
EOS ABS: 0 10*3/uL (ref 0.0–0.7)
EOS PCT: 0 %
HCT: 31.1 % — ABNORMAL LOW (ref 39.0–52.0)
Hemoglobin: 10.7 g/dL — ABNORMAL LOW (ref 13.0–17.0)
LYMPHS ABS: 0.8 10*3/uL (ref 0.7–4.0)
LYMPHS PCT: 7 %
MCH: 34.4 pg — AB (ref 26.0–34.0)
MCHC: 34.4 g/dL (ref 30.0–36.0)
MCV: 100 fL (ref 78.0–100.0)
MONO ABS: 0.7 10*3/uL (ref 0.1–1.0)
Monocytes Relative: 6 %
Neutro Abs: 10.5 10*3/uL — ABNORMAL HIGH (ref 1.7–7.7)
Neutrophils Relative %: 87 %
PLATELETS: 187 10*3/uL (ref 150–400)
RBC: 3.11 MIL/uL — AB (ref 4.22–5.81)
RDW: 14 % (ref 11.5–15.5)
WBC: 11.9 10*3/uL — ABNORMAL HIGH (ref 4.0–10.5)

## 2015-04-23 LAB — URINE MICROSCOPIC-ADD ON

## 2015-04-23 LAB — URINALYSIS, ROUTINE W REFLEX MICROSCOPIC
GLUCOSE, UA: NEGATIVE mg/dL
Ketones, ur: 40 mg/dL — AB
LEUKOCYTES UA: NEGATIVE
Nitrite: NEGATIVE
PH: 5 (ref 5.0–8.0)
Protein, ur: 30 mg/dL — AB
SPECIFIC GRAVITY, URINE: 1.024 (ref 1.005–1.030)

## 2015-04-23 LAB — TROPONIN I
Troponin I: 0.28 ng/mL — ABNORMAL HIGH (ref <0.031)
Troponin I: 0.21 ng/mL — ABNORMAL HIGH (ref <0.031)

## 2015-04-23 LAB — RETICULOCYTES
RBC.: 3 MIL/uL — AB (ref 4.22–5.81)
RETIC COUNT ABSOLUTE: 39 10*3/uL (ref 19.0–186.0)
RETIC CT PCT: 1.3 % (ref 0.4–3.1)

## 2015-04-23 LAB — FOLATE: Folate: 65.1 ng/mL (ref >5.9)

## 2015-04-23 LAB — CK: Total CK: 821 U/L — ABNORMAL HIGH (ref 49–397)

## 2015-04-23 LAB — VITAMIN B12: Vitamin B-12: 1064 pg/mL — ABNORMAL HIGH (ref 180–914)

## 2015-04-23 LAB — IRON AND TIBC
IRON: 20 ug/dL — AB (ref 45–182)
Saturation Ratios: 9 % — ABNORMAL LOW (ref 17.9–39.5)
TIBC: 216 ug/dL — ABNORMAL LOW (ref 250–450)
UIBC: 196 ug/dL

## 2015-04-23 LAB — TSH: TSH: 0.397 u[IU]/mL (ref 0.350–4.500)

## 2015-04-23 LAB — ETHANOL

## 2015-04-23 LAB — CBG MONITORING, ED: GLUCOSE-CAPILLARY: 91 mg/dL (ref 65–99)

## 2015-04-23 LAB — FERRITIN: FERRITIN: 532 ng/mL — AB (ref 24–336)

## 2015-04-23 MED ORDER — ADULT MULTIVITAMIN W/MINERALS CH
1.0000 | ORAL_TABLET | Freq: Every day | ORAL | Status: DC
  Administered 2015-04-23 – 2015-04-26 (×4): 1 via ORAL
  Filled 2015-04-23 (×4): qty 1

## 2015-04-23 MED ORDER — ACETAMINOPHEN 650 MG RE SUPP: 650.0000 mg | Freq: Four times a day (QID) | RECTAL | Status: DC | PRN

## 2015-04-23 MED ORDER — SODIUM CHLORIDE 0.9 % IV BOLUS (SEPSIS)
1000.0000 mL | Freq: Once | INTRAVENOUS | Status: AC
Start: 2015-04-23 — End: 2015-04-23
  Administered 2015-04-23: 1000 mL via INTRAVENOUS

## 2015-04-23 MED ORDER — SODIUM CHLORIDE 0.9 % IJ SOLN
3.0000 mL | Freq: Two times a day (BID) | INTRAMUSCULAR | Status: DC
  Administered 2015-04-23 – 2015-04-25 (×3): 3 mL via INTRAVENOUS

## 2015-04-23 MED ORDER — ASPIRIN 81 MG PO CHEW
324.0000 mg | CHEWABLE_TABLET | Freq: Once | ORAL | Status: AC
  Administered 2015-04-23: 324 mg via ORAL
  Filled 2015-04-23: qty 4

## 2015-04-23 MED ORDER — OMEGA-3-ACID ETHYL ESTERS 1 G PO CAPS
1.0000 g | ORAL_CAPSULE | Freq: Every day | ORAL | Status: DC
  Administered 2015-04-23 – 2015-04-26 (×4): 1 g via ORAL
  Filled 2015-04-23 (×4): qty 1

## 2015-04-23 MED ORDER — SODIUM CHLORIDE 0.9 % IV SOLN
INTRAVENOUS | Status: DC
  Administered 2015-04-23 – 2015-04-26 (×7): via INTRAVENOUS

## 2015-04-23 MED ORDER — ONDANSETRON HCL 4 MG/2ML IJ SOLN: 4.0000 mg | Freq: Four times a day (QID) | INTRAMUSCULAR | Status: DC | PRN

## 2015-04-23 MED ORDER — ASPIRIN EC 81 MG PO TBEC
81.0000 mg | DELAYED_RELEASE_TABLET | Freq: Every day | ORAL | Status: DC
  Administered 2015-04-23 – 2015-04-26 (×4): 81 mg via ORAL
  Filled 2015-04-23 (×4): qty 1

## 2015-04-23 MED ORDER — LIDOCAINE HCL 1 % IJ SOLN
INTRAMUSCULAR | Status: AC
  Filled 2015-04-23: qty 20

## 2015-04-23 MED ORDER — ACETAMINOPHEN 325 MG PO TABS
650.0000 mg | ORAL_TABLET | Freq: Four times a day (QID) | ORAL | Status: DC | PRN
  Filled 2015-04-23: qty 2

## 2015-04-23 MED ORDER — LORAZEPAM 1 MG PO TABS: 1.0000 mg | ORAL_TABLET | Freq: Four times a day (QID) | ORAL | Status: AC | PRN

## 2015-04-23 MED ORDER — ENSURE ENLIVE PO LIQD
237.0000 mL | Freq: Two times a day (BID) | ORAL | Status: DC
  Filled 2015-04-23: qty 237

## 2015-04-23 MED ORDER — OXYCODONE HCL 5 MG PO TABS: 5.0000 mg | ORAL_TABLET | ORAL | Status: DC | PRN

## 2015-04-23 MED ORDER — LORAZEPAM 2 MG/ML IJ SOLN
0.0000 mg | Freq: Two times a day (BID) | INTRAMUSCULAR | Status: DC
Start: 2015-04-25 — End: 2015-04-26

## 2015-04-23 MED ORDER — FOLIC ACID 1 MG PO TABS
1.0000 mg | ORAL_TABLET | Freq: Every day | ORAL | Status: DC
  Administered 2015-04-23 – 2015-04-26 (×4): 1 mg via ORAL
  Filled 2015-04-23 (×4): qty 1

## 2015-04-23 MED ORDER — HYDRALAZINE HCL 20 MG/ML IJ SOLN
10.0000 mg | Freq: Four times a day (QID) | INTRAMUSCULAR | Status: DC | PRN
  Administered 2015-04-23: 10 mg via INTRAVENOUS
  Filled 2015-04-23: qty 1

## 2015-04-23 MED ORDER — AMLODIPINE BESYLATE 5 MG PO TABS
5.0000 mg | ORAL_TABLET | Freq: Every day | ORAL | Status: DC
  Filled 2015-04-23: qty 1

## 2015-04-23 MED ORDER — ENOXAPARIN SODIUM 30 MG/0.3ML ~~LOC~~ SOLN
30.0000 mg | SUBCUTANEOUS | Status: DC
  Administered 2015-04-23 – 2015-04-25 (×2): 30 mg via SUBCUTANEOUS
  Filled 2015-04-23 (×3): qty 0.3

## 2015-04-23 MED ORDER — LORAZEPAM 2 MG/ML IJ SOLN: 1.0000 mg | Freq: Four times a day (QID) | INTRAMUSCULAR | Status: AC | PRN

## 2015-04-23 MED ORDER — ALUM & MAG HYDROXIDE-SIMETH 200-200-20 MG/5ML PO SUSP: 30.0000 mL | Freq: Four times a day (QID) | ORAL | Status: DC | PRN

## 2015-04-23 MED ORDER — GABAPENTIN 100 MG PO CAPS
100.0000 mg | ORAL_CAPSULE | Freq: Every day | ORAL | Status: DC
  Administered 2015-04-23 – 2015-04-25 (×3): 100 mg via ORAL
  Filled 2015-04-23 (×3): qty 1

## 2015-04-23 MED ORDER — TRAZODONE HCL 50 MG PO TABS
25.0000 mg | ORAL_TABLET | Freq: Every evening | ORAL | Status: DC | PRN
  Administered 2015-04-24: 50 mg via ORAL
  Filled 2015-04-23: qty 1

## 2015-04-23 MED ORDER — THIAMINE HCL 100 MG/ML IJ SOLN: 100.0000 mg | Freq: Every day | INTRAMUSCULAR | Status: DC

## 2015-04-23 MED ORDER — DOCUSATE SODIUM 100 MG PO CAPS
100.0000 mg | ORAL_CAPSULE | Freq: Two times a day (BID) | ORAL | Status: DC
Start: 2015-04-23 — End: 2015-04-26
  Administered 2015-04-23 – 2015-04-26 (×6): 100 mg via ORAL
  Filled 2015-04-23 (×7): qty 1

## 2015-04-23 MED ORDER — VITAMIN B-1 100 MG PO TABS
100.0000 mg | ORAL_TABLET | Freq: Every day | ORAL | Status: DC
  Administered 2015-04-23 – 2015-04-26 (×4): 100 mg via ORAL
  Filled 2015-04-23 (×4): qty 1

## 2015-04-23 MED ORDER — LORAZEPAM 2 MG/ML IJ SOLN: 0.0000 mg | Freq: Four times a day (QID) | INTRAMUSCULAR | Status: AC

## 2015-04-23 MED ORDER — SODIUM CHLORIDE 0.9 % IV SOLN
INTRAVENOUS | Status: AC
  Administered 2015-04-23: 15:00:00 via INTRAVENOUS

## 2015-04-23 MED ORDER — ERYTHROMYCIN 5 MG/GM OP OINT
1.0000 "application " | TOPICAL_OINTMENT | Freq: Every day | OPHTHALMIC | Status: DC
  Administered 2015-04-24 – 2015-04-25 (×2): 1 via OPHTHALMIC
  Filled 2015-04-23: qty 3.5

## 2015-04-23 MED ORDER — ONDANSETRON HCL 4 MG PO TABS: 4.0000 mg | ORAL_TABLET | Freq: Four times a day (QID) | ORAL | Status: DC | PRN

## 2015-04-23 NOTE — ED Notes (Signed)
Pt is covered with multiple warm blankets rt mild hypothermia.

## 2015-04-23 NOTE — Progress Notes (Signed)
Raymond Medina [id number] Admission Data: 04/23/2015 3:10 PM Attending Provider: [name], MD  [id number] Plotnikov, MD Consults/ Treatment Team:    Raymond Medina is a 81 y.o. male patient admitted from ED awake, alert  & orientated  X 3,  Full Code, VSS - Blood pressure 177/79, pulse 74, temperature 97.4 F (36.3 C), temperature source Oral, resp. rate 16, SpO2 99 %.,   nasal cannular, no c/o shortness of breath, no c/o chest pain, no distress noted. Tele # [id number].   IV site WDL:  SL.  Allergies:   Allergies  Allergen Reactions  . Budesonide-Formoterol Fumarate     REACTION: dizzy  . Hydrocodone-Acetaminophen     REACTION: prostate/ constipation  . Omeprazole     dizzy  . Plavix [Clopidogrel Bisulfate]     rash  . Ranitidine Hcl     REACTION: abd pain     Past Medical History  Diagnosis Date  . Hypertension   . Low back pain   . Osteoarthritis   . Peripheral vascular disease ([place])   . Gout   . Giant cell arteritis ([place])   . COPD (chronic obstructive pulmonary disease) ([place])   . Allergic rhinitis   . GERD (gastroesophageal reflux disease)   . Tubulovillous adenoma polyp of colon     08/2001  . Hemorrhoids   . Radiation proctitis   . Prostate cancer ([place])   . BPH (benign prostatic hyperplasia)   . Diverticulosis   . INSOMNIA, CHRONIC 06/14/2009  . HYPERTENSION 08/24/2007  . PERIPHERAL VASCULAR DISEASE 08/24/2007  . BRONCHITIS, ACUTE 08/03/2008  . ALLERGIC RHINITIS 09/05/2008  . COPD 08/08/2008  . GERD 12/07/2008  . CONSTIPATION 10/31/2009  . Actinic keratosis 06/04/2008  . OSTEOARTHRITIS 08/24/2007  . NECK PAIN 08/24/2007  . LOW BACK PAIN 08/24/2007  . WEIGHT LOSS 09/17/2009  . IMPAIRED GLUCOSE TOLERANCE 08/03/2008  . Contusion of forearm 09/17/2008  . 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 TOBACCO USE, QUIT 03/14/2009  . PROSTATE CANCER, HX OF 2000  . AVM (arteriovenous malformation)   . CVA (cerebral infarction) 09-09-10    Pt orientation to unit, room and routine.  Information packet given to patient/family and safety video watched.  Admission INP armband ID verified with patient/family, and in place. SR up x 2, fall risk assessment complete with Patient and family verbalizing understanding of risks associated with falls. Pt verbalizes an understanding of how to use the call bell and to call for help before getting out of bed. Skin- Generalized bruising noted, scrape to Left knee noted. No breakdown to sacrum area noted.    Will cont to monitor and assist as needed.  Dayle Points, RN 04/23/2015 3:10 PM

## 2015-04-23 NOTE — ED Notes (Signed)
Pt arrives from home via [facility]. Pt neighbors noticed 2 papers still in driveway and found it odd rt pt always gets his daily paper. Upon entering the house they found the pt laying prone in his bedroom and wet from urine. Pt states he doesn't know why, when or how he fell. It is approximated he was down for 24 hours, daughter had spoke with the pt yesterday morning before he got out of bed. Pt is a&o x4, and complains of thirst, left sided rib pain, left shoulder pain and left arm pain. No obvious deformities noted.

## 2015-04-23 NOTE — Progress Notes (Signed)
PT Cancellation Note  Patient Details Name: Raymond Medina MRN: [id number] DOB: 1926-06-29   Cancelled Treatment:    Reason Eval/Treat Not Completed: Other (comment) Pt just up from ED. Per RN, pt having lots of pain and difficulty rolling in bed, not wanting to be moved. Will follow up next available time to perform PT evaluation.   Marguarite Arbour A Osmani Kersten 04/23/2015, 3:30 PM Wray Kearns, [place], DPT [phone]

## 2015-04-23 NOTE — ED Provider Notes (Signed)
CSN: [id number]     Arrival date & time 04/23/15  [id number] History   First MD Initiated Contact with Patient 04/23/15 1003     Chief Complaint  Patient presents with  . Fall   HPI  Raymond Medina is an 81 y.o. male with history of HTN, COPD, PVD, prostate cancer, CVA 2012 who presents to the ED for evaluation following a fall. Per pt, he fell in his home two days ago. He is unsure of exact mechanism of the fall--thinks he might have tripped over his own feet. States he was stuck down on his floor for two days and was screaming for help for two days. Per EMS/RN report, pt's neighbors noticed newspapers piling up in his driveway so went into his house to check him and found him on the floor covered in urine. In the ED pt states he feels very weak and cold. States he feels like everything hurts, especially his chest and his left elbow.  States his chest hurts a lot when he tries to move. Denies SOB. He is unsure if he hit his head. Denies anticoagulation use. States he has not had anything to eat or drink for two days. He is currently He is A&O x4. He has diffuse bruises on his bilat UE especially L side which he states are new.   Past Medical History  Diagnosis Date  . Hypertension   . Low back pain   . Osteoarthritis   . Peripheral vascular disease ([place])   . Gout   . Giant cell arteritis ([place])   . COPD (chronic obstructive pulmonary disease) ([place])   . Allergic rhinitis   . GERD (gastroesophageal reflux disease)   . Tubulovillous adenoma polyp of colon     08/2001  . Hemorrhoids   . Radiation proctitis   . Prostate cancer ([place])   . BPH (benign prostatic hyperplasia)   . Diverticulosis   . INSOMNIA, CHRONIC 06/14/2009  . HYPERTENSION 08/24/2007  . PERIPHERAL VASCULAR DISEASE 08/24/2007  . BRONCHITIS, ACUTE 08/03/2008  . ALLERGIC RHINITIS 09/05/2008  . COPD 08/08/2008  . GERD 12/07/2008  . CONSTIPATION 10/31/2009  . Actinic keratosis 06/04/2008  . OSTEOARTHRITIS 08/24/2007  . NECK PAIN 08/24/2007  . LOW  BACK PAIN 08/24/2007  . WEIGHT LOSS 09/17/2009  . IMPAIRED GLUCOSE TOLERANCE 08/03/2008  . Contusion of forearm 09/17/2008  . 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 TOBACCO USE, QUIT 03/14/2009  . PROSTATE CANCER, HX OF 2000  . AVM (arteriovenous malformation)   . CVA (cerebral infarction) 09-09-10   Past Surgical History  Procedure Laterality Date  . Insertion prostate radiation seed      2000  . Cataract extraction, bilateral     Family History  Problem Relation Age of Onset  . Stomach cancer Mother   . Hypertension Other   . Diabetes Other     Nephew  . Colon cancer Neg Hx    Social History  Substance Use Topics  . Smoking status: Former Smoker    Types: Cigarettes    Quit date: 09/02/1970  . Smokeless tobacco: None  . Alcohol Use: 16.8 oz/week    28 Cans of beer per week     Comment: 2-3 drinks daily ( no more than 4 beers/d)    Review of Systems  All other systems reviewed and are negative.     Allergies  Budesonide-formoterol fumarate; Hydrocodone-acetaminophen; Omeprazole; Plavix; and Ranitidine hcl  Home Medications   Prior to Admission medications   Medication Sig Start  Date End Date Taking? Authorizing Provider  amLODipine-valsartan (EXFORGE) 5-160 MG tablet TAKE 1 TABLET BY MOUTH EVERY DAY 03/26/15  Yes Aleksei Plotnikov V, MD  erythromycin [facility]) ophthalmic ointment Place 1 application into the left eye at bedtime. 04/17/15  Yes Aleksei Plotnikov V, MD  Incontinence Supply Disposable (DEPEND OVERNIGHT BRIEFS MEDIUM) MISC Use bid 09/14/14  Yes Aleksei Plotnikov V, MD  polyethylene glycol powder (GLYCOLAX/MIRALAX) powder 1 SCOOP ONCE DAILY AS NEEDED FOR CONSTIPATION 05/20/11  Yes Aleksei Plotnikov V, MD  triamcinolone cream (KENALOG) 0.5 % Apply topically 2 (two) times daily. 11/14/12  Yes Aleksei Plotnikov V, MD  aspirin EC 81 MG tablet Take 1 tablet (81 mg total) by mouth daily. 02/19/14   Aleksei Plotnikov V, MD  b complex vitamins tablet Take 1 tablet  by mouth daily. 10/19/13   Aleksei Plotnikov V, MD  celecoxib (CELEBREX) 100 MG capsule Take 1 capsule (100 mg total) by mouth 2 (two) times daily. 03/26/15   Robyn Haber, MD  Cholecalciferol (VITAMIN D3) 1000 UNITS CAPS Take by mouth daily.      Historical Provider, MD  gabapentin (NEURONTIN) 100 MG capsule TAKE 1-2 CAPSULES BY MOUTH AT BEDTIME FOR FEET BURNING 03/27/15   Aleksei Plotnikov V, MD  Omega-3 Fatty Acids (FISH OIL) 1000 MG CPDR Take by mouth daily.      Historical Provider, MD  traZODone (DESYREL) 50 MG tablet Take 0.5-1 tablets (25-50 mg total) by mouth at bedtime as needed for sleep. 04/17/15   Aleksei Plotnikov V, MD   BP 168/59 mmHg  Pulse 63  Temp(Src) 96.5 F (35.8 C) (Rectal)  Resp 13  SpO2 100% Physical Exam  Constitutional: He is oriented to person, place, and time.  Appears thin, chronically ill/poorly nourished. NAD  HENT:  Head: Atraumatic. Head is without raccoon's eyes, without Battle's sign, without abrasion and without contusion.  Right Ear: External ear normal.  Left Ear: External ear normal.  Nose: Nose normal.  Mouth/Throat: Oropharynx is clear and moist. Mucous membranes are dry. No oropharyngeal exudate.  Eyes: Conjunctivae and EOM are normal. Pupils are equal, round, and reactive to light.  Neck: Spinous process tenderness and muscular tenderness present. No rigidity. No tracheal deviation present.  Difficult to assess active ROM. Pt states he cannot move his neck b/c he is too weak. Mild c-spine tenderness. Able to tolerate some passive movement  Cardiovascular: Normal rate, regular rhythm, normal heart sounds and intact distal pulses.   Pulmonary/Chest: Effort normal and breath sounds normal. No stridor. No respiratory distress. He exhibits tenderness.  Chest diffusely tender especially along sternum  Abdominal: Soft. Bowel sounds are normal. He exhibits no distension. There is no tenderness.  Musculoskeletal:  L elbow TTP. All 4 extremities able  to move, though strength decreased diffusely x4. Multiple areas of bruising on bilat shoulder, elbow, hand. Bilat knees with superficial abrasions but nontender, FROM  Neurological: He is alert and oriented to person, place, and time. A sensory deficit is present. No cranial nerve deficit.  Negative pronator drift  Skin: Skin is warm and dry.  Psychiatric: He has a normal mood and affect.    ED Course  Procedures (including critical care time) Labs Review Labs Reviewed  COMPREHENSIVE METABOLIC PANEL - Abnormal; Notable for the following:    Sodium 146 (*)    CO2 20 (*)    Glucose, Bld 106 (*)    BUN 50 (*)    Creatinine, Ser 2.05 (*)    Total Protein 6.1 (*)  AST 42 (*)    Total Bilirubin 1.4 (*)    GFR calc non Af Amer 28 (*)    GFR calc Af Amer 32 (*)    Anion gap 16 (*)    All other components within normal limits  CBC WITH DIFFERENTIAL/PLATELET - Abnormal; Notable for the following:    WBC 11.9 (*)    RBC 3.11 (*)    Hemoglobin 10.7 (*)    HCT 31.1 (*)    MCH 34.4 (*)    Neutro Abs 10.5 (*)    All other components within normal limits  URINALYSIS, ROUTINE W REFLEX MICROSCOPIC (NOT AT [facility]) - Abnormal; Notable for the following:    Hgb urine dipstick MODERATE (*)    Bilirubin Urine MODERATE (*)    Ketones, ur 40 (*)    Protein, ur 30 (*)    All other components within normal limits  CK - Abnormal; Notable for the following:    Total CK 821 (*)    All other components within normal limits  URINE MICROSCOPIC-ADD ON - Abnormal; Notable for the following:    Squamous Epithelial / LPF 0-5 (*)    Bacteria, UA RARE (*)    All other components within normal limits  I-STAT TROPOININ, ED - Abnormal; Notable for the following:    Troponin i, poc 0.51 (*)    All other components within normal limits  TROPONIN I  TROPONIN I  URINE RAPID DRUG SCREEN, HOSP PERFORMED  ETHANOL  CBG MONITORING, ED    Imaging Review Ct Abdomen Pelvis Wo Contrast  04/23/2015  CLINICAL DATA:   Recent fall with left-sided body pain, initial encounter EXAM: CT CHEST, ABDOMEN AND PELVIS WITHOUT CONTRAST TECHNIQUE: Multidetector CT imaging of the chest, abdomen and pelvis was performed following the standard protocol without IV contrast. COMPARISON:  None. FINDINGS: CT CHEST FINDINGS Mediastinum/Lymph Nodes: Diffuse calcification of the thoracic aorta in its branches are noted. Coronary calcifications are seen as well. Multiple calcified right hilar lymph nodes are noted consistent with prior granulomatous disease. No acute abnormality is noted. Lungs/Pleura: Well aerated without evidence of focal infiltrate or sizable effusion. Diffuse emphysematous changes are noted. Additionally a few calcified granulomas are seen. Multiple calcified pleural plaques are seen. Musculoskeletal: Degenerative changes of the thoracic spine are noted. No acute abnormality is seen. CT ABDOMEN PELVIS FINDINGS Hepatobiliary: Within normal limits. Pancreas: Within normal limits. Spleen: Multiple calcified granulomas are seen. Adrenals/Urinary Tract: No renal calculi or obstructive changes are noted. A large right renal cyst is noted which measures approximately 4.5 cm in greatest dimension. The adrenal glands are within normal limits. Stomach/Bowel: The appendix is not well visualized although no inflammatory changes are seen to suggest appendicitis. Scattered diverticular change is noted without evidence of diverticulitis. No obstructive changes are seen. Vascular/Lymphatic: Diffuse aortic calcifications are seen. Mild ectasia to 28 mm is noted in the infrarenal aorta. No significant lymphadenopathy is identified. Reproductive: Prostate brachytherapy seeds are noted. Other: Bladder is well distended.  No pelvic mass lesion is seen. Musculoskeletal: Degenerative changes of the lumbar spine are noted. No acute bony abnormality is seen. IMPRESSION: CT of the chest: Chronic changes within the chest to include calcified  granulomas,calcified pleural plaques and emphysematous changes. No acute abnormality is noted. CT of the abdomen and pelvis:  Right renal cyst. Findings of prior granulomatous disease. Diverticulosis without diverticulitis. Electronically Signed   By: Inez Catalina M.D.   On: 04/23/2015 12:25   Dg Chest 1 View  04/23/2015  CLINICAL  DATA:  Pain in the left elbow due to fall 2 days ago. Initial encounter. EXAM: CHEST 1 VIEW COMPARISON:  02/02/2010 FINDINGS: Normal heart size and stable aortic tortuosity. Chronic subpleural reticulation at the bases. There is no edema, consolidation, effusion, or pneumothorax. Subtle angular density near the right hilum which is chronic, correlating with calcified pleural plaque based on 2007 chest CT. IMPRESSION: No active disease. Electronically Signed   By: Monte Fantasia M.D.   On: 04/23/2015 11:32   Dg Elbow Complete Left  04/23/2015  CLINICAL DATA:  Pain following fall 2 days prior EXAM: LEFT ELBOW - COMPLETE 3+ VIEW COMPARISON:  None. FINDINGS: Frontal, lateral, and bilateral oblique views were obtained. There is soft tissue edema. There is no demonstrable fracture or dislocation. No appreciable elbow joint effusion. There is mild generalized osteoarthritic change. No erosive change. IMPRESSION: Soft tissue edema with mild generalized osteoarthritic change. No fracture or dislocation apparent. Electronically Signed   By: Lowella Grip III M.D.   On: 04/23/2015 11:37   Ct Head Wo Contrast  04/23/2015  CLINICAL DATA:  Fall. EXAM: CT HEAD WITHOUT CONTRAST CT CERVICAL SPINE WITHOUT CONTRAST TECHNIQUE: Multidetector CT imaging of the head and cervical spine was performed following the standard protocol without intravenous contrast. Multiplanar CT image reconstructions of the cervical spine were also generated. COMPARISON:  09/09/2010 FINDINGS: CT HEAD FINDINGS There is low attenuation throughout the subcortical and periventricular white matter compatible with chronic  microvascular disease. Prominence of the sulci and ventricles identified consistent with brain atrophy. No acute intracranial hemorrhage, cortical infarct or mass. The paranasal sinuses and the mastoid air cells are clear. The calvarium is intact. CT CERVICAL SPINE FINDINGS Normal alignment of the cervical spine. The vertebral body heights are well preserved. The prevertebral soft tissue space is normal. The facet joints appear well-aligned. No fractures or dislocations identified. Extensive carotid artery calcifications identified bilaterally. Changes of emphysema identified within the lung apices. IMPRESSION: 1. No acute intracranial abnormality. 2. Chronic microvascular disease and brain atrophy. 3. No evidence for cervical spine fracture. 4. Carotid artery atherosclerotic calcifications. Electronically Signed   By: Kerby Moors M.D.   On: 04/23/2015 12:19   Ct Chest Wo Contrast  04/23/2015  CLINICAL DATA:  Recent fall with left-sided body pain, initial encounter EXAM: CT CHEST, ABDOMEN AND PELVIS WITHOUT CONTRAST TECHNIQUE: Multidetector CT imaging of the chest, abdomen and pelvis was performed following the standard protocol without IV contrast. COMPARISON:  None. FINDINGS: CT CHEST FINDINGS Mediastinum/Lymph Nodes: Diffuse calcification of the thoracic aorta in its branches are noted. Coronary calcifications are seen as well. Multiple calcified right hilar lymph nodes are noted consistent with prior granulomatous disease. No acute abnormality is noted. Lungs/Pleura: Well aerated without evidence of focal infiltrate or sizable effusion. Diffuse emphysematous changes are noted. Additionally a few calcified granulomas are seen. Multiple calcified pleural plaques are seen. Musculoskeletal: Degenerative changes of the thoracic spine are noted. No acute abnormality is seen. CT ABDOMEN PELVIS FINDINGS Hepatobiliary: Within normal limits. Pancreas: Within normal limits. Spleen: Multiple calcified granulomas are  seen. Adrenals/Urinary Tract: No renal calculi or obstructive changes are noted. A large right renal cyst is noted which measures approximately 4.5 cm in greatest dimension. The adrenal glands are within normal limits. Stomach/Bowel: The appendix is not well visualized although no inflammatory changes are seen to suggest appendicitis. Scattered diverticular change is noted without evidence of diverticulitis. No obstructive changes are seen. Vascular/Lymphatic: Diffuse aortic calcifications are seen. Mild ectasia to 28 mm is noted in  the infrarenal aorta. No significant lymphadenopathy is identified. Reproductive: Prostate brachytherapy seeds are noted. Other: Bladder is well distended.  No pelvic mass lesion is seen. Musculoskeletal: Degenerative changes of the lumbar spine are noted. No acute bony abnormality is seen. IMPRESSION: CT of the chest: Chronic changes within the chest to include calcified granulomas,calcified pleural plaques and emphysematous changes. No acute abnormality is noted. CT of the abdomen and pelvis:  Right renal cyst. Findings of prior granulomatous disease. Diverticulosis without diverticulitis. Electronically Signed   By: Inez Catalina M.D.   On: 04/23/2015 12:25   Ct Cervical Spine Wo Contrast  04/23/2015  CLINICAL DATA:  Fall. EXAM: CT HEAD WITHOUT CONTRAST CT CERVICAL SPINE WITHOUT CONTRAST TECHNIQUE: Multidetector CT imaging of the head and cervical spine was performed following the standard protocol without intravenous contrast. Multiplanar CT image reconstructions of the cervical spine were also generated. COMPARISON:  09/09/2010 FINDINGS: CT HEAD FINDINGS There is low attenuation throughout the subcortical and periventricular white matter compatible with chronic microvascular disease. Prominence of the sulci and ventricles identified consistent with brain atrophy. No acute intracranial hemorrhage, cortical infarct or mass. The paranasal sinuses and the mastoid air cells are clear.  The calvarium is intact. CT CERVICAL SPINE FINDINGS Normal alignment of the cervical spine. The vertebral body heights are well preserved. The prevertebral soft tissue space is normal. The facet joints appear well-aligned. No fractures or dislocations identified. Extensive carotid artery calcifications identified bilaterally. Changes of emphysema identified within the lung apices. IMPRESSION: 1. No acute intracranial abnormality. 2. Chronic microvascular disease and brain atrophy. 3. No evidence for cervical spine fracture. 4. Carotid artery atherosclerotic calcifications. Electronically Signed   By: Kerby Moors M.D.   On: 04/23/2015 12:19   I have personally reviewed and evaluated these images and lab results as part of my medical decision-making.   EKG Interpretation None      MDM   Final diagnoses:  Dehydration  Weakness  AKI (acute kidney injury) ([place])   - fail swallow screen  81 y.o. who comes in after a fall. Found on floor after 2 days. Labs show signs of dehydration including Cr 2.05, ketones/protein in urine, CK 821. Trop is 0.51 but pt's chest pain sounds more musculoskeletal so I suspect that this elevation is 2/2 dehydration injury rather than ACS. However 324 of ASA given in the ED and pt will likely need delta trops as an inpatient. I pan-scanned pt due to age, weakness, and diffuse pain but scans are reassuring. Discussed with Erin Hearing of hospital medicine, will admit for dehydration and weakness. Started on 1L of NS here in the ED.  Anne Ng, PA-C 04/23/15 1338  Gareth Morgan, MD 04/25/15 2106

## 2015-04-23 NOTE — ED Notes (Signed)
Patient transported to [facility] 

## 2015-04-23 NOTE — Evaluation (Signed)
Clinical/Bedside Swallow Evaluation Patient Details  Name: Raymond Medina MRN: [id number] Date of Birth: Oct 10, 1926  Today's Date: 04/23/2015 Time: SLP Start Time (ACUTE ONLY): 1450 SLP Stop Time (ACUTE ONLY): 1510 SLP Time Calculation (min) (ACUTE ONLY): 20 min  Past Medical History:  Past Medical History  Diagnosis Date  . Hypertension   . Low back pain   . Osteoarthritis   . Peripheral vascular disease ([place])   . Gout   . Giant cell arteritis ([place])   . COPD (chronic obstructive pulmonary disease) ([place])   . Allergic rhinitis   . GERD (gastroesophageal reflux disease)   . Tubulovillous adenoma polyp of colon     08/2001  . Hemorrhoids   . Radiation proctitis   . Prostate cancer ([place])   . BPH (benign prostatic hyperplasia)   . Diverticulosis   . INSOMNIA, CHRONIC 06/14/2009  . HYPERTENSION 08/24/2007  . PERIPHERAL VASCULAR DISEASE 08/24/2007  . BRONCHITIS, ACUTE 08/03/2008  . ALLERGIC RHINITIS 09/05/2008  . COPD 08/08/2008  . GERD 12/07/2008  . CONSTIPATION 10/31/2009  . Actinic keratosis 06/04/2008  . OSTEOARTHRITIS 08/24/2007  . NECK PAIN 08/24/2007  . LOW BACK PAIN 08/24/2007  . WEIGHT LOSS 09/17/2009  . IMPAIRED GLUCOSE TOLERANCE 08/03/2008  . Contusion of forearm 09/17/2008  . 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 TOBACCO USE, QUIT 03/14/2009  . PROSTATE CANCER, HX OF 2000  . AVM (arteriovenous malformation)   . CVA (cerebral infarction) 09-09-10   Past Surgical History:  Past Surgical History  Procedure Laterality Date  . Insertion prostate radiation seed      2000  . Cataract extraction, bilateral     HPI:  81 y.o. male with history of hypertension, COPD, peripheral vascular disease, CVA, dementia,  presented to ED after a fall at home, unwitnessed. The patient was found down by neighbors.  Dx dehydration/acute renal failure, elevated troponin,  severe protein-calorie malnutrition (lives alone, eats two small meals/day). Failed RN swallow screen in ED.    Assessment / Plan /  Recommendation Clinical Impression  Pt presents with normal oropharyngeal swallow characterized by adequate mastication, brisk swallow response, and no s/s of aspiration.  Pt passed three-0z water test, able to consume consecutive boluses with no s/s of aspiration.  Recommend resuming regular diet, thin liquids.  Will need assist with tray set-up and possibly self-feeding initially.      Aspiration Risk  No limitations    Diet Recommendation   regular diet, thin liquids  Medication Administration: Whole meds with liquid    Other  Recommendations Oral Care Recommendations: Oral care BID   Follow up Recommendations  None    Frequency and Duration            Swallow Study   General Date of Onset: 04/23/15 HPI: 81 y.o. male with history of hypertension, COPD, peripheral vascular disease, CVA, dementia,  presented to ED after a fall at home, unwitnessed. The patient was found down by neighbors.  Dx dehydration/acute renal failure, elevated troponin,  severe protein-calorie malnutrition (lives alone, eats two small meals/day). Failed RN swallow screen in ED.  Type of Study: Bedside Swallow Evaluation Previous Swallow Assessment: none per records Diet Prior to this Study: NPO Temperature Spikes Noted: No Respiratory Status: Room air History of Recent Intubation: No Behavior/Cognition: Alert;Confused Oral Cavity Assessment: Within Functional Limits Oral Care Completed by SLP: No Oral Cavity - Dentition: Missing dentition Vision: Functional for self-feeding (with glasses in place) Self-Feeding Abilities: Needs assist;Able to feed self Patient Positioning: Upright in  bed Baseline Vocal Quality: Normal Volitional Cough: Strong Volitional Swallow: Able to elicit    Oral/Motor/Sensory Function Overall Oral Motor/Sensory Function: Within functional limits   Ice Chips Ice chips: Within functional limits   Thin Liquid Thin Liquid: Within functional limits Presentation: Cup;Straw     Nectar Thick Nectar Thick Liquid: Not tested   Honey Thick Honey Thick Liquid: Not tested   Puree Puree: Within functional limits   Solid Solid: Within functional limits      Raymond Medina L. Tivis Ringer, [place] CCC/SLP Pager [phone]  Raymond Medina 04/23/2015,3:20 PM

## 2015-04-23 NOTE — H&P (Signed)
[facility] History and Physical                                                                                    Raymond Medina, is a 81 y.o. male  MRN: [id number]   DOB - 09/11/1926  Admit Date - 04/23/2015  Outpatient Primary MD for the patient is [name], MD  Referring MD: Billy Fischer / ER  With History of -  Past Medical History  Diagnosis Date  . Hypertension   . Low back pain   . Osteoarthritis   . Peripheral vascular disease ([place])   . Gout   . Giant cell arteritis ([place])   . COPD (chronic obstructive pulmonary disease) ([place])   . Allergic rhinitis   . GERD (gastroesophageal reflux disease)   . Tubulovillous adenoma polyp of colon     08/2001  . Hemorrhoids   . Radiation proctitis   . Prostate cancer ([place])   . BPH (benign prostatic hyperplasia)   . Diverticulosis   . INSOMNIA, CHRONIC 06/14/2009  . HYPERTENSION 08/24/2007  . PERIPHERAL VASCULAR DISEASE 08/24/2007  . BRONCHITIS, ACUTE 08/03/2008  . ALLERGIC RHINITIS 09/05/2008  . COPD 08/08/2008  . GERD 12/07/2008  . CONSTIPATION 10/31/2009  . Actinic keratosis 06/04/2008  . OSTEOARTHRITIS 08/24/2007  . NECK PAIN 08/24/2007  . LOW BACK PAIN 08/24/2007  . WEIGHT LOSS 09/17/2009  . IMPAIRED GLUCOSE TOLERANCE 08/03/2008  . Contusion of forearm 09/17/2008  . 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 TOBACCO USE, QUIT 03/14/2009  . PROSTATE CANCER, HX OF 2000  . AVM (arteriovenous malformation)   . CVA (cerebral infarction) 09-09-10      Past Surgical History  Procedure Laterality Date  . Insertion prostate radiation seed      2000  . Cataract extraction, bilateral      in for   Chief Complaint  Patient presents with  . Fall     HPI This is an 81 year old male with known history of hypertension, COPD, peripheral vascular disease, prostate cancer, CVA 2012, history of alcoholism with previously documented alcoholic dementia, grief reaction and resultant chronic depression after his wife died, unintentional  weight loss and severe protein calorie malnutrition, chronic insomnia and GERD who presents after experiencing a fall at home (Mechanically mediated or a syncopal event). Patient recalls falling at home 2 days prior and says that as he was walking forward he took a step and basically lost his balance. He does not have any dizziness or palpitations prior to the event. Unfortunately he was unable to get up and laid on the floor for 2 days. Today his neighbors noticed to paper still in the driveway which was unusual for the patient. They went into the home and heard him calling out for help and found him on the floor in a prone position. He had been incontinent of bowel and bladder at this point. EMS was called to the home and the patient was brought to the hospital. He was complaining of being thirsty, left-sided rib pain, left shoulder and arm pain.  The ER the patient was hypothermic with a rectal temperature of 96.5, BP was 169 or 59, pulse 63 and  regular, respirations 13 and room air saturations 100%. CT scan of the chest abdomen and pelvis as well as noncontrasted CT of the head were unremarkable for acute changes. Chest x-Lecomte unremarkable. X-Whistler left elbow with soft tissue edema and mild generalized osteoarthritic change without fracture or dislocation. EKG revealed sinus rhythm without ischemic changes. Laboratory data was consistent with dehydration with a sodium of 146, BUN 50 and creatinine 2.05, total bilirubin 1.4 and AST of 42, total CK 821, troponin 0.51, white count was 11,900 hemoglobin 10.7 and platelets 187,000, neutrophils are 87% and absolute neutrophils are 10.5% urinalysis with moderate amount of bilirubin, moderate amount of hemoglobin, 40 ketones and 30 of protein. In the ER the patient has been given a liter of normal saline and 324 mg of aspirin. Patient did not pass bedside swallowing evaluation performed by RN.   Review of Systems   In addition to the HPI above,  No Fever-chills,  myalgias or other constitutional symptoms No Headache, changes with Vision or hearing, new weakness, tingling, numbness in any extremity, No problems swallowing food or Liquids, indigestion/reflux No Chest pain, Cough or Shortness of Breath, palpitations, orthopnea or DOE No Abdominal pain, N/V; no melena or hematochezia, no dark tarry stools, Bowel movements are regular, No dysuria, hematuria or flank pain No new skin rashes, lesions, masses or bruises, No new joints pains-aches No polyuria, polydypsia or polyphagia,  *A full 10 point Review of Systems was done, except as stated above, all other Review of Systems were negative.  Social History Social History  Substance Use Topics  . Smoking status: Former Smoker    Types: Cigarettes    Quit date: 09/02/1970  . Smokeless tobacco: Not on file  . Alcohol Use: 16.8 oz/week    28 Cans of beer per week     Comment: 2-3 drinks daily ( no more than 4 beers/d)-states quit 2 weeks ago     Resides at: Private residence  Lives with: Alone  Ambulatory status: Without assistive devices   Family History Family History  Problem Relation Age of Onset  . Stomach cancer Mother   . Hypertension Other   . Diabetes Other     Nephew  . Colon cancer Neg Hx      Prior to Admission medications   Medication Sig Start Date End Date Taking? Authorizing Provider  amLODipine-valsartan (EXFORGE) 5-160 MG tablet TAKE 1 TABLET BY MOUTH EVERY DAY 03/26/15  Yes Aleksei Plotnikov V, MD  erythromycin [facility]) ophthalmic ointment Place 1 application into the left eye at bedtime. 04/17/15  Yes Aleksei Plotnikov V, MD  Incontinence Supply Disposable (DEPEND OVERNIGHT BRIEFS MEDIUM) MISC Use bid 09/14/14  Yes Aleksei Plotnikov V, MD  polyethylene glycol powder (GLYCOLAX/MIRALAX) powder 1 SCOOP ONCE DAILY AS NEEDED FOR CONSTIPATION 05/20/11  Yes Aleksei Plotnikov V, MD  triamcinolone cream (KENALOG) 0.5 % Apply topically 2 (two) times daily. 11/14/12  Yes  Aleksei Plotnikov V, MD  aspirin EC 81 MG tablet Take 1 tablet (81 mg total) by mouth daily. 02/19/14   Aleksei Plotnikov V, MD  b complex vitamins tablet Take 1 tablet by mouth daily. 10/19/13   Aleksei Plotnikov V, MD  celecoxib (CELEBREX) 100 MG capsule Take 1 capsule (100 mg total) by mouth 2 (two) times daily. 03/26/15   Robyn Haber, MD  Cholecalciferol (VITAMIN D3) 1000 UNITS CAPS Take by mouth daily.      Historical Provider, MD  gabapentin (NEURONTIN) 100 MG capsule TAKE 1-2 CAPSULES BY MOUTH AT BEDTIME FOR FEET BURNING  03/27/15   Aleksei Plotnikov V, MD  Omega-3 Fatty Acids (FISH OIL) 1000 MG CPDR Take by mouth daily.      Historical Provider, MD  traZODone (DESYREL) 50 MG tablet Take 0.5-1 tablets (25-50 mg total) by mouth at bedtime as needed for sleep. 04/17/15   Tresa Garter, MD    Allergies  Allergen Reactions  . Budesonide-Formoterol Fumarate     REACTION: dizzy  . Hydrocodone-Acetaminophen     REACTION: prostate/ constipation  . Omeprazole     dizzy  . Plavix [Clopidogrel Bisulfate]     rash  . Ranitidine Hcl     REACTION: abd pain    Physical Exam  Vitals  Blood pressure 186/65, pulse 67, temperature 96.5 F (35.8 C), temperature source Rectal, resp. rate 15, SpO2 100 %.   General:  In no acute distress, appears very malnourished but otherwise consistent with stated age  Psych:  Flat affect, Awake Alert, Oriented X name and place. Appears to have short-term memory deficits most notable when obtaining social history: Continued to tell examiner he quit smoking in 1972 although neighbor at bedside only met patient in the late 1990s and patient continues to smoke into the 2000s  Neuro:   No focal neurological deficits, CN II through XII grossly intact although patient did not pass bedside swallowing evaluation performed by RN at bedside, Strength 4/5 all 4 extremities, Sensation intact all 4 extremities.  ENT:  Ears and Eyes appear Normal, Conjunctivae  clear, PER. Dry oral mucosa without erythema or exudates. Poor dentition  Neck:  Supple, No lymphadenopathy appreciated  Respiratory:  Symmetrical chest wall movement, Good air movement bilaterally, CTAB. Room Air  Cardiac:  RRR, No Murmurs, no LE edema noted, no JVD, No carotid bruits, peripheral pulses palpable at 2+  Abdomen:  Positive bowel sounds, Soft, Non tender, Non distended,  No masses appreciated, no obvious hepatosplenomegaly  Skin:  No Cyanosis, Normal Skin Turgor, multiple areas of bruising and superficial skin abrasions on the extremities with significant bruising and some swelling to left olecranon process  Extremities: Symmetrical,  no effusions except for noted swelling to left olecranon process as above.  Data Review  CBC  Recent Labs Lab 04/23/15 1035  WBC 11.9*  HGB 10.7*  HCT 31.1*  PLT 187  MCV 100.0  MCH 34.4*  MCHC 34.4  RDW 14.0  LYMPHSABS 0.8  MONOABS 0.7  EOSABS 0.0  BASOSABS 0.0    Chemistries   Recent Labs Lab 04/23/15 1035  NA 146*  K 3.8  CL 110  CO2 20*  GLUCOSE 106*  BUN 50*  CREATININE 2.05*  CALCIUM 9.5  AST 42*  ALT 23  ALKPHOS 53  BILITOT 1.4*    estimated creatinine clearance is 21.4 mL/min (by C-G formula based on Cr of 2.05).  No results for input(s): TSH, T4TOTAL, T3FREE, THYROIDAB in the last 72 hours.  Invalid input(s): FREET3  Coagulation profile No results for input(s): INR, PROTIME in the last 168 hours.  No results for input(s): DDIMER in the last 72 hours.  Cardiac Enzymes No results for input(s): CKMB, TROPONINI, MYOGLOBIN in the last 168 hours.  Invalid input(s): CK  Invalid input(s): POCBNP  Urinalysis    Component Value Date/Time   COLORURINE YELLOW 04/23/2015 1122   APPEARANCEUR CLEAR 04/23/2015 1122   LABSPEC 1.024 04/23/2015 1122   PHURINE 5.0 04/23/2015 1122   GLUCOSEU NEGATIVE 04/23/2015 1122   GLUCOSEU NEGATIVE 06/23/2010 1102   HGBUR MODERATE* 04/23/2015 1122   BILIRUBINUR  MODERATE* 04/23/2015 1122   KETONESUR 40* 04/23/2015 1122   PROTEINUR 30* 04/23/2015 1122   UROBILINOGEN 1.0 09/09/2010 1143   NITRITE NEGATIVE 04/23/2015 1122   LEUKOCYTESUR NEGATIVE 04/23/2015 1122    Imaging results:   Ct Abdomen Pelvis Wo Contrast  04/23/2015  CLINICAL DATA:  Recent fall with left-sided body pain, initial encounter EXAM: CT CHEST, ABDOMEN AND PELVIS WITHOUT CONTRAST TECHNIQUE: Multidetector CT imaging of the chest, abdomen and pelvis was performed following the standard protocol without IV contrast. COMPARISON:  None. FINDINGS: CT CHEST FINDINGS Mediastinum/Lymph Nodes: Diffuse calcification of the thoracic aorta in its branches are noted. Coronary calcifications are seen as well. Multiple calcified right hilar lymph nodes are noted consistent with prior granulomatous disease. No acute abnormality is noted. Lungs/Pleura: Well aerated without evidence of focal infiltrate or sizable effusion. Diffuse emphysematous changes are noted. Additionally a few calcified granulomas are seen. Multiple calcified pleural plaques are seen. Musculoskeletal: Degenerative changes of the thoracic spine are noted. No acute abnormality is seen. CT ABDOMEN PELVIS FINDINGS Hepatobiliary: Within normal limits. Pancreas: Within normal limits. Spleen: Multiple calcified granulomas are seen. Adrenals/Urinary Tract: No renal calculi or obstructive changes are noted. A large right renal cyst is noted which measures approximately 4.5 cm in greatest dimension. The adrenal glands are within normal limits. Stomach/Bowel: The appendix is not well visualized although no inflammatory changes are seen to suggest appendicitis. Scattered diverticular change is noted without evidence of diverticulitis. No obstructive changes are seen. Vascular/Lymphatic: Diffuse aortic calcifications are seen. Mild ectasia to 28 mm is noted in the infrarenal aorta. No significant lymphadenopathy is identified. Reproductive: Prostate  brachytherapy seeds are noted. Other: Bladder is well distended.  No pelvic mass lesion is seen. Musculoskeletal: Degenerative changes of the lumbar spine are noted. No acute bony abnormality is seen. IMPRESSION: CT of the chest: Chronic changes within the chest to include calcified granulomas,calcified pleural plaques and emphysematous changes. No acute abnormality is noted. CT of the abdomen and pelvis:  Right renal cyst. Findings of prior granulomatous disease. Diverticulosis without diverticulitis. Electronically Signed   By: Inez Catalina M.D.   On: 04/23/2015 12:25   Dg Chest 1 View  04/23/2015  CLINICAL DATA:  Pain in the left elbow due to fall 2 days ago. Initial encounter. EXAM: CHEST 1 VIEW COMPARISON:  02/02/2010 FINDINGS: Normal heart size and stable aortic tortuosity. Chronic subpleural reticulation at the bases. There is no edema, consolidation, effusion, or pneumothorax. Subtle angular density near the right hilum which is chronic, correlating with calcified pleural plaque based on 2007 chest CT. IMPRESSION: No active disease. Electronically Signed   By: Monte Fantasia M.D.   On: 04/23/2015 11:32   Dg Elbow Complete Left  04/23/2015  CLINICAL DATA:  Pain following fall 2 days prior EXAM: LEFT ELBOW - COMPLETE 3+ VIEW COMPARISON:  None. FINDINGS: Frontal, lateral, and bilateral oblique views were obtained. There is soft tissue edema. There is no demonstrable fracture or dislocation. No appreciable elbow joint effusion. There is mild generalized osteoarthritic change. No erosive change. IMPRESSION: Soft tissue edema with mild generalized osteoarthritic change. No fracture or dislocation apparent. Electronically Signed   By: Lowella Grip III M.D.   On: 04/23/2015 11:37   Ct Head Wo Contrast  04/23/2015  CLINICAL DATA:  Fall. EXAM: CT HEAD WITHOUT CONTRAST CT CERVICAL SPINE WITHOUT CONTRAST TECHNIQUE: Multidetector CT imaging of the head and cervical spine was performed following the  standard protocol without intravenous contrast. Multiplanar CT image reconstructions of the cervical spine  were also generated. COMPARISON:  09/09/2010 FINDINGS: CT HEAD FINDINGS There is low attenuation throughout the subcortical and periventricular white matter compatible with chronic microvascular disease. Prominence of the sulci and ventricles identified consistent with brain atrophy. No acute intracranial hemorrhage, cortical infarct or mass. The paranasal sinuses and the mastoid air cells are clear. The calvarium is intact. CT CERVICAL SPINE FINDINGS Normal alignment of the cervical spine. The vertebral body heights are well preserved. The prevertebral soft tissue space is normal. The facet joints appear well-aligned. No fractures or dislocations identified. Extensive carotid artery calcifications identified bilaterally. Changes of emphysema identified within the lung apices. IMPRESSION: 1. No acute intracranial abnormality. 2. Chronic microvascular disease and brain atrophy. 3. No evidence for cervical spine fracture. 4. Carotid artery atherosclerotic calcifications. Electronically Signed   By: Kerby Moors M.D.   On: 04/23/2015 12:19   Ct Chest Wo Contrast  04/23/2015  CLINICAL DATA:  Recent fall with left-sided body pain, initial encounter EXAM: CT CHEST, ABDOMEN AND PELVIS WITHOUT CONTRAST TECHNIQUE: Multidetector CT imaging of the chest, abdomen and pelvis was performed following the standard protocol without IV contrast. COMPARISON:  None. FINDINGS: CT CHEST FINDINGS Mediastinum/Lymph Nodes: Diffuse calcification of the thoracic aorta in its branches are noted. Coronary calcifications are seen as well. Multiple calcified right hilar lymph nodes are noted consistent with prior granulomatous disease. No acute abnormality is noted. Lungs/Pleura: Well aerated without evidence of focal infiltrate or sizable effusion. Diffuse emphysematous changes are noted. Additionally a few calcified granulomas are  seen. Multiple calcified pleural plaques are seen. Musculoskeletal: Degenerative changes of the thoracic spine are noted. No acute abnormality is seen. CT ABDOMEN PELVIS FINDINGS Hepatobiliary: Within normal limits. Pancreas: Within normal limits. Spleen: Multiple calcified granulomas are seen. Adrenals/Urinary Tract: No renal calculi or obstructive changes are noted. A large right renal cyst is noted which measures approximately 4.5 cm in greatest dimension. The adrenal glands are within normal limits. Stomach/Bowel: The appendix is not well visualized although no inflammatory changes are seen to suggest appendicitis. Scattered diverticular change is noted without evidence of diverticulitis. No obstructive changes are seen. Vascular/Lymphatic: Diffuse aortic calcifications are seen. Mild ectasia to 28 mm is noted in the infrarenal aorta. No significant lymphadenopathy is identified. Reproductive: Prostate brachytherapy seeds are noted. Other: Bladder is well distended.  No pelvic mass lesion is seen. Musculoskeletal: Degenerative changes of the lumbar spine are noted. No acute bony abnormality is seen. IMPRESSION: CT of the chest: Chronic changes within the chest to include calcified granulomas,calcified pleural plaques and emphysematous changes. No acute abnormality is noted. CT of the abdomen and pelvis:  Right renal cyst. Findings of prior granulomatous disease. Diverticulosis without diverticulitis. Electronically Signed   By: Inez Catalina M.D.   On: 04/23/2015 12:25   Ct Cervical Spine Wo Contrast  04/23/2015  CLINICAL DATA:  Fall. EXAM: CT HEAD WITHOUT CONTRAST CT CERVICAL SPINE WITHOUT CONTRAST TECHNIQUE: Multidetector CT imaging of the head and cervical spine was performed following the standard protocol without intravenous contrast. Multiplanar CT image reconstructions of the cervical spine were also generated. COMPARISON:  09/09/2010 FINDINGS: CT HEAD FINDINGS There is low attenuation throughout the  subcortical and periventricular white matter compatible with chronic microvascular disease. Prominence of the sulci and ventricles identified consistent with brain atrophy. No acute intracranial hemorrhage, cortical infarct or mass. The paranasal sinuses and the mastoid air cells are clear. The calvarium is intact. CT CERVICAL SPINE FINDINGS Normal alignment of the cervical spine. The vertebral body heights are well preserved.  The prevertebral soft tissue space is normal. The facet joints appear well-aligned. No fractures or dislocations identified. Extensive carotid artery calcifications identified bilaterally. Changes of emphysema identified within the lung apices. IMPRESSION: 1. No acute intracranial abnormality. 2. Chronic microvascular disease and brain atrophy. 3. No evidence for cervical spine fracture. 4. Carotid artery atherosclerotic calcifications. Electronically Signed   By: Kerby Moors M.D.   On: 04/23/2015 12:19     EKG: (Independently reviewed) sinus rhythm with ventricular rate 65 bpm, QTC 464 ms, no acute ischemic changes, voltage criteria met for LVH   Assessment & Plan  Principal Problem:   Syncope/ Fall at home -Telemetry bed -Most likely mechanical in nature but given history of orthostasis and documented history of poor oral intake need to rule out syncopal etiology -Echocardiogram -PT/OT evaluation and mobilize with assistance initially  Active Problems:   Dehydration/Acute renal failure (ARF) (HCC) -Baseline renal function BUN 29 creatinine 1.38 -Hold ARB -Gentle IV fluid hydration normal saline 100 mL per hour -Labs in a.m. -component of mild rhabdomyolysis with CK 800-repeat CK in a.m.    Elevated troponin -Likely reflective of acute renal failure mild rhabdomyolysis -Patient denies issues with chronic chest pain or dyspnea on exertion which would be consistent with ischemia -As precaution we'll cycle cardiac enzymes as part of routine syncope    Essential  hypertension -Moderately controlled -Resume preadmission Norvasc -Holding ARB in setting of acute renal failure    COPD (chronic obstructive pulmonary disease) (HCC) -Currently stable without wheezing -Apparently quit smoking about 10 years ago    Depression/ INSOMNIA, CHRONIC -Apparent onset after wife died around 08/18/2009 -Recently restarted on trazodone on November 16 by PCP-continue this admission -Suspect this contributing to patient's overall health and poor nutrition (see below)    Severe protein-calorie malnutrition (HCC)/?? Dysphagia -Patient lives alone and does not cook -Typically eats a bowl of cereal with banana in the morning and a sandwich with lettuce for lunch or supper but only eats 2 meals per day -Patient unable to characterize amount of weight loss but neighbors who have known him for multiple years have noticed his weight has significantly dropped since the death of his wife -Case management consultation regarding available outpatient resources such as Meals on Wheels -Inpatient nutrition consultation -Patient with history of CVA in 08-19-2010 and has failed swallowing evaluation in the ER and therefore will obtain formal speech therapy evaluation-if has chronic dysphagia this may be contributed to patient's weight loss as well    GERD -Continue PPI    Dementia, alcoholic ([place]) -Seems to have short-term memory deficits -Patient reports stopped drinking 2 weeks ago -AST slightly elevated at 53 -Check urine drug screen and alcohol level -CIWA   ? Alcoholic peripheral neuropathy -Continue preadmission Neurontin -Check anemia panel and TSH    Blepharitis of eyelid of left eye -Continue preadmission Romycin started on 11/16 by PCP    DVT Prophylaxis: Lovenox dose adjusted for current GFR  Family Communication:  No family at bedside but with patient's permission discussed with patient's neighbors   Code Status:  Full code  Condition:  Stable  Discharge  disposition: Anticipate discharge back to home in the next 24-72 hours pending PT/OT as well as swallowing evaluation; anticipate patient will eventually move to [place] with his sister  Time spent in minutes : 60      ELLIS,ALLISON L. ANP on 04/23/2015 at 1:19 PM  Between 7am to 7pm - Pager - [phone]  After 7pm go to [email or web address] - password [facility]  And look for the night coverage person covering me after hours  [facility]

## 2015-04-24 ENCOUNTER — Inpatient Hospital Stay (HOSPITAL_COMMUNITY): Payer: Medicare Other

## 2015-04-24 DIAGNOSIS — I10 Essential (primary) hypertension: Secondary | ICD-10-CM

## 2015-04-24 DIAGNOSIS — Y92099 Unspecified place in other non-institutional residence as the place of occurrence of the external cause: Secondary | ICD-10-CM

## 2015-04-24 DIAGNOSIS — R55 Syncope and collapse: Secondary | ICD-10-CM

## 2015-04-24 DIAGNOSIS — W19XXXA Unspecified fall, initial encounter: Secondary | ICD-10-CM

## 2015-04-24 LAB — BASIC METABOLIC PANEL
ANION GAP: 9 (ref 5–15)
BUN: 37 mg/dL — ABNORMAL HIGH (ref 6–20)
CALCIUM: 8.5 mg/dL — AB (ref 8.9–10.3)
CHLORIDE: 113 mmol/L — AB (ref 101–111)
CO2: 21 mmol/L — AB (ref 22–32)
Creatinine, Ser: 1.38 mg/dL — ABNORMAL HIGH (ref 0.61–1.24)
GFR calc non Af Amer: 45 mL/min — ABNORMAL LOW (ref >60)
GFR, EST AFRICAN AMERICAN: 52 mL/min — AB (ref >60)
GLUCOSE: 113 mg/dL — AB (ref 65–99)
POTASSIUM: 3.2 mmol/L — AB (ref 3.5–5.1)
Sodium: 143 mmol/L (ref 135–145)

## 2015-04-24 LAB — RAPID URINE DRUG SCREEN, HOSP PERFORMED
AMPHETAMINES: NOT DETECTED
BENZODIAZEPINES: NOT DETECTED
Barbiturates: NOT DETECTED
COCAINE: NOT DETECTED
Opiates: NOT DETECTED
Tetrahydrocannabinol: NOT DETECTED

## 2015-04-24 LAB — CBC
HEMATOCRIT: 28.2 % — AB (ref 39.0–52.0)
HEMOGLOBIN: 9.9 g/dL — AB (ref 13.0–17.0)
MCH: 34.3 pg — AB (ref 26.0–34.0)
MCHC: 35.1 g/dL (ref 30.0–36.0)
MCV: 97.6 fL (ref 78.0–100.0)
Platelets: 151 10*3/uL (ref 150–400)
RBC: 2.89 MIL/uL — AB (ref 4.22–5.81)
RDW: 13.7 % (ref 11.5–15.5)
WBC: 10 10*3/uL (ref 4.0–10.5)

## 2015-04-24 LAB — TROPONIN I: TROPONIN I: 0.18 ng/mL — AB (ref <0.031)

## 2015-04-24 LAB — CK: Total CK: 700 U/L — ABNORMAL HIGH (ref 49–397)

## 2015-04-24 MED ORDER — AMLODIPINE BESYLATE 5 MG PO TABS
5.0000 mg | ORAL_TABLET | Freq: Every day | ORAL | Status: DC
Start: 2015-04-24 — End: 2015-04-26
  Administered 2015-04-24 – 2015-04-26 (×3): 5 mg via ORAL
  Filled 2015-04-24 (×3): qty 1

## 2015-04-24 MED ORDER — ENSURE ENLIVE PO LIQD
237.0000 mL | Freq: Two times a day (BID) | ORAL | Status: DC
  Administered 2015-04-24 – 2015-04-26 (×4): 237 mL via ORAL

## 2015-04-24 NOTE — Progress Notes (Signed)
Initial Nutrition Assessment  DOCUMENTATION CODES:   Non-severe (moderate) malnutrition in context of chronic illness  INTERVENTION:   -Continue Ensure Enlive po BID, each supplement provides 350 kcal and 20 grams of protein  NUTRITION DIAGNOSIS:   Malnutrition related to chronic illness as evidenced by mild depletion of body fat, mild depletion of muscle mass.  GOAL:   Patient will meet greater than or equal to 90% of their needs  MONITOR:   PO intake, Supplement acceptance, Labs, Weight trends, Skin, I & O's  REASON FOR ASSESSMENT:   Consult Assessment of nutrition requirement/status  ASSESSMENT:   Raymond Medina is a 81 y.o. male with history of hypertension, COPD, peripheral vascular disease, CVA, dementia presented to ED after a fall at home, unwitnessed. The patient was found by his neighbors laying prone in his bedroom, wet from urine. Patient reported that he was walking forward when he missed a step, may have entangled the foot in the cord.   Pt admitted with syncope/fall at home.   Pt unable to provide hx due to cognitive deficit. He is asking this RD to "put on my jacket". Sitting in recliner at time of visit.   Spoke with RN, who reports pt is most alert in the morning. She reports she suspects pt is experiencing sundowning while in the hospital and is quite confused presently. He is consuming his Ensure supplements well. Just walked around the room with the walker prior to RD visit.  Pt reports his appetite is good; meal completion 75-80%. He reports ate "cucumbers" for lunch. Pt consuming Ensure supplement and encouraged intake of food and supplements.   Nutrition-Focused physical exam completed. Findings are mild to moderate fat depletion, moderate to severe muscle depletion, and no edema.   Noted wt stability over the past 9 months, however, distant weight loss > 1 year ago.   CSW following. Plan for SNF placement once medically stable.  Labs reviewed: K:  3.2.   Diet Order:  Diet regular Room service appropriate?: Yes; Fluid consistency:: Thin  Skin:  Reviewed, no issues  Last BM:  04/23/15  Height:   Ht Readings from Last 1 Encounters:  03/26/15 5\' 7"  (1.702 m)    Weight:   Wt Readings from Last 1 Encounters:  04/17/15 129 lb (58.514 kg)    Ideal Body Weight:  67.3 kg  BMI:  Estimated body mass index is 20.20 kg/(m^2) as calculated from the following:   Height as of 03/26/15: 5\' 7"  (1.702 m).   Weight as of 04/17/15: 129 lb (58.514 kg).  Estimated Nutritional Needs:   Kcal:  1500-1700  Protein:  75-90 grams  Fluid:  1.5-1.7 L  EDUCATION NEEDS:   Education needs addressed  Adriauna Campton A. Jimmye Norman, RD, LDN, CDE Pager: [phone] After hours Pager: [phone]

## 2015-04-24 NOTE — Progress Notes (Signed)
Patient Demographics  Raymond Medina, is a 81 y.o. male, DOB - 20-Aug-1926, [id number]  Admit date - 04/23/2015   Admitting Physician [name], NP  Outpatient Primary MD for the patient is [name], MD  LOS - 1   Chief Complaint  Patient presents with  . Fall        Subjective:   Raymond Medina today has, No headache, No chest pain, No abdominal pain - No Nausea,  No Cough - SOB. Reports generalized weakness, but he is feeling better today.  Assessment & Plan    Principal Problem:   Syncope Active Problems:   INSOMNIA, CHRONIC   Essential hypertension   COPD (chronic obstructive pulmonary disease) (HCC)   GERD   Dementia, alcoholic (HCC)   Blepharitis of eyelid of left eye   Dehydration   Fall at home   Acute renal failure (ARF) ([place])   Depression   Severe protein-calorie malnutrition ([place])   Syncopal episodes  Fall at home  - This is most likely mechanical fall more than syncope , as patient denies any loss of consciousness , reports muscle spasmodic right lower extremity prior to fall. - 2-D echo with EF 55-60%, no wall motion abnormality .  Acute on chronic renal failure stage III - Baseline creatinine 1.38, was 2.05 on admission most likely due to volume depletion and rhabdomyolysis. - Improving with IV fluids - Continue to hold ARB  Elevated troponin - Patient denies any chest pain or shortness of breath, troponins trending down this is most likely related to acute renal failure and mild rhabdomyolysis -  2-D echo EF 55-60%, no regional wall motion abnormality as discussed with Dr. Johnsie Cancel   Mild rhabdomyolysis - Continue with IV fluid - Recheck total CK in a.m.  Hypertension  - Labile, overall poorly controlled, continue with Norvasc, continue to hold ARB, will add when necessary hydralazine.  COPD  - No active wheezing   Protein calorie malnutrition  -  Continue with ensure    GERD  - Continue with PPI     Blepharitis of eyelid of left eye -Continue preadmission Romycin started on 11/16 by PCP  History of alcohol abuse - Patient reports last drink 7-8 weeks ago, continue to monitor on CIWA   Code Status: full   Family Communication: none at bedside  Disposition Plan: will need SNF placement , in 24-48 hours depending on clinical improvement .  Procedures  None    Consults   None   Medications  Scheduled Meds: . aspirin EC  81 mg Oral Daily  . docusate sodium  100 mg Oral BID  . enoxaparin (LOVENOX) injection  30 mg Subcutaneous Q24H  . erythromycin  1 application Left Eye QHS  . folic acid  1 mg Oral Daily  . gabapentin  100 mg Oral QHS  . LORazepam  0-4 mg Intravenous Q6H   Followed by  . [START ON 04/25/2015] LORazepam  0-4 mg Intravenous Q12H  . multivitamin with minerals  1 tablet Oral Daily  . omega-3 acid ethyl esters  1 g Oral Daily  . sodium chloride  3 mL Intravenous Q12H  . thiamine  100 mg Oral Daily   Continuous Infusions: . sodium chloride 100 mL/hr at 04/24/15  1042   PRN Meds:.acetaminophen **OR** acetaminophen, alum & mag hydroxide-simeth, hydrALAZINE, LORazepam **OR** LORazepam, ondansetron **OR** ondansetron (ZOFRAN) IV, oxyCODONE, traZODone  DVT Prophylaxis  Lovenox   Lab Results  Component Value Date   PLT 151 04/24/2015    Antibiotics    Anti-infectives    None          Objective:   Filed Vitals:   04/23/15 1450 04/23/15 1807 04/23/15 2217 04/24/15 0442  BP: 177/79 139/79 123/44 183/64  Pulse: 74 71 61 74  Temp: 97.4 F (36.3 C) 98.4 F (36.9 C) 98.7 F (37.1 C) 97.9 F (36.6 C)  TempSrc: Oral Oral Oral Oral  Resp: 16 16 18 20   SpO2: 99% 98% 98% 99%    Wt Readings from Last 3 Encounters:  04/17/15 58.514 kg (129 lb)  03/26/15 58.06 kg (128 lb)  03/06/15 58.968 kg (130 lb)     Intake/Output Summary (Last 24 hours) at 04/24/15 1317 Last data filed at 04/24/15  1222  Gross per 24 hour  Intake    790 ml  Output    500 ml  Net    290 ml     Physical Exam  Awake Alert, Oriented  Endocrine oral mucosa Supple Neck,No JVD, Symmetrical Chest wall movement, Good air movement bilaterally, CTAB RRR,No Gallops,Rubs or new Murmurs, No Parasternal Heave +ve B.Sounds, Abd Soft, No tenderness, No organomegaly appriciated, No rebound - guarding or rigidity. No Cyanosis, Clubbing or edema,  senile purpura    Data Review   Micro Results No results found for this or any previous visit (from the past 240 hour(s)).  Radiology Reports Ct Abdomen Pelvis Wo Contrast  04/23/2015  CLINICAL DATA:  Recent fall with left-sided body pain, initial encounter EXAM: CT CHEST, ABDOMEN AND PELVIS WITHOUT CONTRAST TECHNIQUE: Multidetector CT imaging of the chest, abdomen and pelvis was performed following the standard protocol without IV contrast. COMPARISON:  None. FINDINGS: CT CHEST FINDINGS Mediastinum/Lymph Nodes: Diffuse calcification of the thoracic aorta in its branches are noted. Coronary calcifications are seen as well. Multiple calcified right hilar lymph nodes are noted consistent with prior granulomatous disease. No acute abnormality is noted. Lungs/Pleura: Well aerated without evidence of focal infiltrate or sizable effusion. Diffuse emphysematous changes are noted. Additionally a few calcified granulomas are seen. Multiple calcified pleural plaques are seen. Musculoskeletal: Degenerative changes of the thoracic spine are noted. No acute abnormality is seen. CT ABDOMEN PELVIS FINDINGS Hepatobiliary: Within normal limits. Pancreas: Within normal limits. Spleen: Multiple calcified granulomas are seen. Adrenals/Urinary Tract: No renal calculi or obstructive changes are noted. A large right renal cyst is noted which measures approximately 4.5 cm in greatest dimension. The adrenal glands are within normal limits. Stomach/Bowel: The appendix is not well visualized although no  inflammatory changes are seen to suggest appendicitis. Scattered diverticular change is noted without evidence of diverticulitis. No obstructive changes are seen. Vascular/Lymphatic: Diffuse aortic calcifications are seen. Mild ectasia to 28 mm is noted in the infrarenal aorta. No significant lymphadenopathy is identified. Reproductive: Prostate brachytherapy seeds are noted. Other: Bladder is well distended.  No pelvic mass lesion is seen. Musculoskeletal: Degenerative changes of the lumbar spine are noted. No acute bony abnormality is seen. IMPRESSION: CT of the chest: Chronic changes within the chest to include calcified granulomas,calcified pleural plaques and emphysematous changes. No acute abnormality is noted. CT of the abdomen and pelvis:  Right renal cyst. Findings of prior granulomatous disease. Diverticulosis without diverticulitis. Electronically Signed   By: Linus Mako.D.  On: 04/23/2015 12:25   Dg Chest 1 View  04/23/2015  CLINICAL DATA:  Pain in the left elbow due to fall 2 days ago. Initial encounter. EXAM: CHEST 1 VIEW COMPARISON:  02/02/2010 FINDINGS: Normal heart size and stable aortic tortuosity. Chronic subpleural reticulation at the bases. There is no edema, consolidation, effusion, or pneumothorax. Subtle angular density near the right hilum which is chronic, correlating with calcified pleural plaque based on 2007 chest CT. IMPRESSION: No active disease. Electronically Signed   By: Monte Fantasia M.D.   On: 04/23/2015 11:32   Dg Elbow Complete Left  04/23/2015  CLINICAL DATA:  Pain following fall 2 days prior EXAM: LEFT ELBOW - COMPLETE 3+ VIEW COMPARISON:  None. FINDINGS: Frontal, lateral, and bilateral oblique views were obtained. There is soft tissue edema. There is no demonstrable fracture or dislocation. No appreciable elbow joint effusion. There is mild generalized osteoarthritic change. No erosive change. IMPRESSION: Soft tissue edema with mild generalized osteoarthritic  change. No fracture or dislocation apparent. Electronically Signed   By: Lowella Grip III M.D.   On: 04/23/2015 11:37   Ct Head Wo Contrast  04/23/2015  CLINICAL DATA:  Fall. EXAM: CT HEAD WITHOUT CONTRAST CT CERVICAL SPINE WITHOUT CONTRAST TECHNIQUE: Multidetector CT imaging of the head and cervical spine was performed following the standard protocol without intravenous contrast. Multiplanar CT image reconstructions of the cervical spine were also generated. COMPARISON:  09/09/2010 FINDINGS: CT HEAD FINDINGS There is low attenuation throughout the subcortical and periventricular white matter compatible with chronic microvascular disease. Prominence of the sulci and ventricles identified consistent with brain atrophy. No acute intracranial hemorrhage, cortical infarct or mass. The paranasal sinuses and the mastoid air cells are clear. The calvarium is intact. CT CERVICAL SPINE FINDINGS Normal alignment of the cervical spine. The vertebral body heights are well preserved. The prevertebral soft tissue space is normal. The facet joints appear well-aligned. No fractures or dislocations identified. Extensive carotid artery calcifications identified bilaterally. Changes of emphysema identified within the lung apices. IMPRESSION: 1. No acute intracranial abnormality. 2. Chronic microvascular disease and brain atrophy. 3. No evidence for cervical spine fracture. 4. Carotid artery atherosclerotic calcifications. Electronically Signed   By: Kerby Moors M.D.   On: 04/23/2015 12:19   Ct Chest Wo Contrast  04/23/2015  CLINICAL DATA:  Recent fall with left-sided body pain, initial encounter EXAM: CT CHEST, ABDOMEN AND PELVIS WITHOUT CONTRAST TECHNIQUE: Multidetector CT imaging of the chest, abdomen and pelvis was performed following the standard protocol without IV contrast. COMPARISON:  None. FINDINGS: CT CHEST FINDINGS Mediastinum/Lymph Nodes: Diffuse calcification of the thoracic aorta in its branches are  noted. Coronary calcifications are seen as well. Multiple calcified right hilar lymph nodes are noted consistent with prior granulomatous disease. No acute abnormality is noted. Lungs/Pleura: Well aerated without evidence of focal infiltrate or sizable effusion. Diffuse emphysematous changes are noted. Additionally a few calcified granulomas are seen. Multiple calcified pleural plaques are seen. Musculoskeletal: Degenerative changes of the thoracic spine are noted. No acute abnormality is seen. CT ABDOMEN PELVIS FINDINGS Hepatobiliary: Within normal limits. Pancreas: Within normal limits. Spleen: Multiple calcified granulomas are seen. Adrenals/Urinary Tract: No renal calculi or obstructive changes are noted. A large right renal cyst is noted which measures approximately 4.5 cm in greatest dimension. The adrenal glands are within normal limits. Stomach/Bowel: The appendix is not well visualized although no inflammatory changes are seen to suggest appendicitis. Scattered diverticular change is noted without evidence of diverticulitis. No obstructive changes are seen. Vascular/Lymphatic:  Diffuse aortic calcifications are seen. Mild ectasia to 28 mm is noted in the infrarenal aorta. No significant lymphadenopathy is identified. Reproductive: Prostate brachytherapy seeds are noted. Other: Bladder is well distended.  No pelvic mass lesion is seen. Musculoskeletal: Degenerative changes of the lumbar spine are noted. No acute bony abnormality is seen. IMPRESSION: CT of the chest: Chronic changes within the chest to include calcified granulomas,calcified pleural plaques and emphysematous changes. No acute abnormality is noted. CT of the abdomen and pelvis:  Right renal cyst. Findings of prior granulomatous disease. Diverticulosis without diverticulitis. Electronically Signed   By: Inez Catalina M.D.   On: 04/23/2015 12:25   Ct Cervical Spine Wo Contrast  04/23/2015  CLINICAL DATA:  Fall. EXAM: CT HEAD WITHOUT CONTRAST CT  CERVICAL SPINE WITHOUT CONTRAST TECHNIQUE: Multidetector CT imaging of the head and cervical spine was performed following the standard protocol without intravenous contrast. Multiplanar CT image reconstructions of the cervical spine were also generated. COMPARISON:  09/09/2010 FINDINGS: CT HEAD FINDINGS There is low attenuation throughout the subcortical and periventricular white matter compatible with chronic microvascular disease. Prominence of the sulci and ventricles identified consistent with brain atrophy. No acute intracranial hemorrhage, cortical infarct or mass. The paranasal sinuses and the mastoid air cells are clear. The calvarium is intact. CT CERVICAL SPINE FINDINGS Normal alignment of the cervical spine. The vertebral body heights are well preserved. The prevertebral soft tissue space is normal. The facet joints appear well-aligned. No fractures or dislocations identified. Extensive carotid artery calcifications identified bilaterally. Changes of emphysema identified within the lung apices. IMPRESSION: 1. No acute intracranial abnormality. 2. Chronic microvascular disease and brain atrophy. 3. No evidence for cervical spine fracture. 4. Carotid artery atherosclerotic calcifications. Electronically Signed   By: Kerby Moors M.D.   On: 04/23/2015 12:19     CBC  Recent Labs Lab 04/23/15 1035 04/24/15 0503  WBC 11.9* 10.0  HGB 10.7* 9.9*  HCT 31.1* 28.2*  PLT 187 151  MCV 100.0 97.6  MCH 34.4* 34.3*  MCHC 34.4 35.1  RDW 14.0 13.7  LYMPHSABS 0.8  --   MONOABS 0.7  --   EOSABS 0.0  --   BASOSABS 0.0  --     Chemistries   Recent Labs Lab 04/23/15 1035 04/24/15 0503  NA 146* 143  K 3.8 3.2*  CL 110 113*  CO2 20* 21*  GLUCOSE 106* 113*  BUN 50* 37*  CREATININE 2.05* 1.38*  CALCIUM 9.5 8.5*  AST 42*  --   ALT 23  --   ALKPHOS 53  --   BILITOT 1.4*  --     ------------------------------------------------------------------------------------------------------------------ estimated creatinine clearance is 31.8 mL/min (by C-G formula based on Cr of 1.38). ------------------------------------------------------------------------------------------------------------------ No results for input(s): HGBA1C in the last 72 hours. ------------------------------------------------------------------------------------------------------------------ No results for input(s): CHOL, HDL, LDLCALC, TRIG, CHOLHDL, LDLDIRECT in the last 72 hours. ------------------------------------------------------------------------------------------------------------------  Recent Labs  04/23/15 1630  TSH 0.397   ------------------------------------------------------------------------------------------------------------------  Recent Labs  04/23/15 1630  VITAMINB12 1064*  FOLATE 65.1  FERRITIN 532*  TIBC 216*  IRON 20*  RETICCTPCT 1.3    Coagulation profile No results for input(s): INR, PROTIME in the last 168 hours.  No results for input(s): DDIMER in the last 72 hours.  Cardiac Enzymes  Recent Labs Lab 04/23/15 1630 04/23/15 2240 04/24/15 0503  TROPONINI 0.28* 0.21* 0.18*   ------------------------------------------------------------------------------------------------------------------ Invalid input(s): POCBNP     Time Spent in minutes   30 minutes   Johnston Maddocks M.D on 04/24/2015 at 1:17  PM  Between 7am to 7pm - Pager - [phone]  After 7pm go to [email or web address] - password [facility]  [facility]   Office  [phone]

## 2015-04-24 NOTE — Clinical Social Work Note (Signed)
Clinical Social Work Assessment  Patient Details  Name: NILESH STEGALL MRN: [id number] Date of Birth: 1927/01/31  Date of referral:  04/24/15               Reason for consult:  Facility Placement                Permission sought to share information with:  Facility Art therapist granted to share information::  Yes, Verbal Permission Granted  Name::     Raymond Medina (Patient appeared a bit disoriented and could not remember the name of his family contacts, but stated he would like me to contact his son.)  Agency::  [facility]  Relationship::  Son  Contact Information:  [phone]  Housing/Transportation Living arrangements for the past 2 months:  [place] of Information:  Patient Patient Interpreter Needed:  None Criminal Activity/Legal Involvement Pertinent to Current Situation/Hospitalization:  No - Comment as needed Significant Relationships:  Adult Children, Siblings, Neighbor Lives with:  Self Do you feel safe going back to the place where you live?  No Need for family participation in patient care:  Yes (Comment)  Care giving concerns:  CSW received referral for possible SNF placement at time of discharge. CSW met with patient regarding PT recommendation of SNF placement at time of discharge. Patient reports he lives at home alone and is currently unable to care for himself given patient's current physical needs and fall risk. Patient's sister and son live out of state. Patient expressed understanding of PT recommendation and is agreeable to SNF placement at time of discharge. CSW to continue to follow and assist with discharge planning needs.   Social Worker assessment / plan:  Spoke with patient concerning possibility of rehab at SNF before returning home.  Employment status:  Retired Forensic scientist:  Medicare PT Recommendations:  [place] / Referral to community resources:  [place]  Patient/Family's Response to care:  Patient recognizes need for rehab before returning home and is agreeable to a SNF in [place].   Patient/Family's Understanding of and Emotional Response to Diagnosis, Current Treatment, and Prognosis:  Patient is realistic regarding therapy needs. No questions/concerns about plan or treatment.    Emotional Assessment Appearance:  Appears stated age Attitude/Demeanor/Rapport:    Affect (typically observed):  Appropriate Orientation:  Oriented to Self, Oriented to Place Alcohol / Substance use:  Not Applicable Psych involvement (Current and /or in the community):  No (Comment)  Discharge Needs  Concerns to be addressed:  Care Coordination Readmission within the last 30 days:  No Current discharge risk:  None Barriers to Discharge:  Continued Medical Work up   [employer], LCSW 04/24/2015, 2:20 PM

## 2015-04-24 NOTE — Progress Notes (Signed)
Echocardiogram 2D Echocardiogram has been performed.  Raymond Medina 04/24/2015, 10:42 AM

## 2015-04-24 NOTE — Evaluation (Addendum)
Occupational Therapy Evaluation Patient Details Name: Raymond Medina MRN: [id number] DOB: 1926/07/28 Today's Date: 04/24/2015    History of Present Illness 81 y.o. male with history of HTN, COPD, PVD, prostate cancer, CVA 2012 who presents to the ED for evaluation following a fall. Per pt, he fell in his home two days ago. He is unsure of exact mechanism of the fall--thinks he might have tripped over his own feet. States he was stuck down on his floor for two days and was screaming for help for two days. Per EMS/RN report, pt's neighbors noticed newspapers piling up in his driveway so went into his house to check him and found him on the floor covered in urine.   Clinical Impression   Pt admitted with above. Pt independent with ADLs (assist with IADLs), PTA. Feel pt will benefit from acute OT to increase strength and independence prior to d/c. Recommending SNF upon d/c.    Follow Up Recommendations  SNF    Equipment Recommendations  Other (comment) (defer to next venue)    Recommendations for Other Services       Precautions / Restrictions Precautions Precautions: Fall Restrictions Weight Bearing Restrictions: No      Mobility Bed Mobility Overal bed mobility: Needs Assistance Bed Mobility: Supine to Sit;Sit to Supine     Supine to sit: Max assist Sit to supine: Max assist (Mod-max assist)   General bed mobility comments: assist with trunk and LEs when coming to sitting position. +2 assist given to scoot HOB.   Transfers Overall transfer level: Needs assistance Equipment used: Rolling walker (2 wheeled) Transfers: Sit to/from Stand Sit to Stand: Max assist         General transfer comment: assist to boost and for balance.     Balance    Pt required assist for standing balance while he was also using RW.                                        ADL Overall ADL's : Needs assistance/impaired Eating/Feeding: Independent;Bed level                   Lower Body Dressing: Maximal assistance;Sit to/from stand   Toilet Transfer: Maximal assistance;RW (sit to stand from bed)        ADL comments: Spoke with pt's neighbors (with pt's permission) about OT's d/c recommendation as well as how pt did in session.            Vision     Perception     Praxis      Pertinent Vitals/Pain Pain Assessment: 0-10 Pain Score: 7  Pain Location: chest Pain Intervention(s): Monitored during session     Hand Dominance     Extremity/Trunk Assessment Upper Extremity Assessment Upper Extremity Assessment: Generalized weakness   Lower Extremity Assessment Lower Extremity Assessment: Defer to PT evaluation       Communication Communication Communication: HOH   Cognition Arousal/Alertness: Awake/alert Behavior During Therapy: WFL for tasks assessed/performed Overall Cognitive Status: Within Functional Limits for tasks assessed                     General Comments       Exercises       Shoulder Instructions      Home Living Family/patient expects to be discharged to:: Private residence (wants to go home, seems willing to consider SNF)  Living Arrangements: Alone Available Help at Discharge: Neighbor;Family (pt reports his sister is coming to stay with him) Type of Home: House Home Access: Stairs to enter [employer] of Steps: 4 Entrance Stairs-Rails: Right;Left Home Layout: One level     Bathroom Shower/Tub: Teacher, early years/pre: Standard     Home Equipment: Environmental consultant - 2 wheels          Prior Functioning/Environment Level of Independence: Needs assistance  Gait / Transfers Assistance Needed: uses RW ADL's / Homemaking Assistance Needed: assist with cooking and cleaning        OT Diagnosis: Acute pain;Generalized weakness   OT Problem List: Decreased strength;Decreased range of motion;Pain;Decreased knowledge of precautions;Decreased knowledge of use of DME or AE;Impaired  balance (sitting and/or standing);Decreased activity tolerance   OT Treatment/Interventions: Patient/family education;Self-care/ADL training;DME and/or AE instruction;Therapeutic activities;Balance training;Therapeutic exercise    OT Goals(Current goals can be found in the care plan section) Acute Rehab OT Goals Patient Stated Goal: go home OT Goal Formulation: With patient Time For Goal Achievement: 05/01/15 Potential to Achieve Goals: Good ADL Goals Pt Will Perform Lower Body Dressing: sit to/from stand;with mod assist Pt Will Transfer to Toilet: with mod assist;stand pivot transfer;bedside commode Pt Will Perform Toileting - Clothing Manipulation and hygiene: with mod assist;sit to/from stand Additional ADL Goal #1: Pt will perform bed mobility with min assist as precursor for ADLs.  OT Frequency: Min 2X/week   Barriers to D/C:            Co-evaluation              End of Session Equipment Utilized During Treatment: Gait belt;Rolling walker Nurse Communication: Other (comment) (came in and assisted with session; catheter stuck in session)  Activity Tolerance: Patient tolerated treatment well Patient left: in bed;with call bell/phone within reach;with bed alarm set;with family/visitor present   Time: 1110-1131 OT Time Calculation (min): 21 min Charges:  OT General Charges $OT Visit: 1 Procedure OT Evaluation $Initial OT Evaluation Tier I: 1 Procedure G-CodesBenito Mccreedy OTR/L [id number] 04/24/2015, 12:38 PM

## 2015-04-24 NOTE — Progress Notes (Signed)
Utilization review completed. Shaylynne Lunt, RN, BSN. 

## 2015-04-24 NOTE — Evaluation (Signed)
Physical Therapy Evaluation Patient Details Name: Raymond Medina MRN: [id number] DOB: [birth year] Today's Date: 04/24/2015   History of Present Illness  81 y.o. male with history of HTN, COPD, PVD, prostate cancer, CVA 2012 who presents to the ED for evaluation following a fall. Patient found lying prone after about 2 days.  Clinical Impression  Patient presents with decreased independence with mobility due to deficits listed in PT problem list.  He will benefit from skilled PT in the acute setting to allow return home following SNF rehab stay. Patient with slowed reactions and balance deficits at high risk for fall with injury if left alone.    Follow Up Recommendations SNF;Supervision/Assistance - 24 hour    Equipment Recommendations  None recommended by PT    Recommendations for Other Services       Precautions / Restrictions Precautions Precautions: Fall Restrictions Weight Bearing Restrictions: No      Mobility  Bed Mobility Overal bed mobility: Needs Assistance Bed Mobility: Rolling;Sidelying to Sit Rolling: Min assist Sidelying to sit: Mod assist Supine to sit: Max assist Sit to supine: Max assist (Mod-max assist)   General bed mobility comments: assist to reach rail, and lift trunk, cues for moving legs off bed  Transfers Overall transfer level: Needs assistance Equipment used: Rolling walker (2 wheeled) Transfers: Sit to/from Stand Sit to Stand: Mod assist         General transfer comment: lifting assist from bed cues for hand placement as pt attempts to pull up on walker and falls back to bed, assist and cues for safety stand to sit on recliner  Ambulation/Gait Ambulation/Gait assistance: Min assist Ambulation Distance (Feet): 60 Feet Assistive device: Rolling walker (2 wheeled) Gait Pattern/deviations: Step-to pattern;Shuffle;Narrow base of support;Decreased stride length     General Gait Details: assist for anterior weight shift, for keeping walker on  the floor, for foot clearance, LOB posterior with turns and backing up to chair  Stairs            Wheelchair Mobility    Modified Rankin (Stroke Patients Only)       Balance Overall balance assessment: Needs assistance Sitting-balance support: Feet unsupported;No upper extremity supported Sitting balance-Leahy Scale: Good Sitting balance - Comments: reaches to floor, to Rt and Lt supervision Postural control: Posterior lean Standing balance support: Bilateral upper extremity supported Standing balance-Leahy Scale: Poor Standing balance comment: assist and UE support for balance, cues for anterior weight shift, especially posterior initial standing                             Pertinent Vitals/Pain Pain Assessment: Faces Pain Score: 7  Faces Pain Scale: Hurts even more Pain Location: left ribs with mobility Pain Descriptors / Indicators: Sore Pain Intervention(s): Monitored during session;Limited activity within patient's tolerance    Home Living Family/patient expects to be discharged to:: Skilled nursing facility Living Arrangements: Alone Available Help at Discharge: Neighbor;Available PRN/intermittently Type of Home: House Home Access: Stairs to enter Entrance Stairs-Rails: None (at side) Entrance Stairs-Number of Steps: 2 Home Layout: One level Home Equipment: Walker - 2 wheels;Bedside commode      Prior Function Level of Independence: Independent with assistive device(s);Needs assistance   Gait / Transfers Assistance Needed: uses RW  ADL's / Homemaking Assistance Needed: assist with cooking and cleaning, states he can get a sandwich        Hand Dominance        Extremity/Trunk Assessment  Upper Extremity Assessment: Generalized weakness           Lower Extremity Assessment: RLE deficits/detail;LLE deficits/detail RLE Deficits / Details: AROM WFL, strength hip flex 3+/5, knee ext 4/5, ankle DF 4+/5 LLE Deficits / Details: AROM  WFL, strength hip flex 3+/5, knee ext 4/5, ankle DF 4+/5  Cervical / Trunk Assessment: Kyphotic  Communication   Communication: No difficulties  Cognition Arousal/Alertness: Awake/alert Behavior During Therapy: WFL for tasks assessed/performed Overall Cognitive Status: No family/caregiver present to determine baseline cognitive functioning Area of Impairment: Orientation;Following commands;Problem solving Orientation Level: Time;Disoriented to     Following Commands: Follows multi-step commands with increased time     Problem Solving: Slow processing;Decreased initiation;Requires verbal cues      General Comments General comments (skin integrity, edema, etc.): bruising along both forearms and skinned knees bilaterally; neighbors present and report pt with blindness in left eye    Exercises        Assessment/Plan    PT Assessment Patient needs continued PT services  PT Diagnosis Generalized weakness;Abnormality of gait   PT Problem List Decreased strength;Decreased cognition;Decreased activity tolerance;Decreased knowledge of use of DME;Decreased safety awareness;Decreased balance;Decreased mobility;Pain  PT Treatment Interventions DME instruction;Balance training;Gait training;Functional mobility training;Patient/family education;Therapeutic activities;Therapeutic exercise;Cognitive remediation   PT Goals (Current goals can be found in the Care Plan section) Acute Rehab PT Goals Patient Stated Goal: agrees he may need rehab before going home PT Goal Formulation: With patient Time For Goal Achievement: 05/08/15 Potential to Achieve Goals: Good    Frequency Min 3X/week   Barriers to discharge        Co-evaluation               End of Session Equipment Utilized During Treatment: Gait belt Activity Tolerance: Patient limited by fatigue Patient left: in chair;with chair alarm set;with family/visitor present;with call bell/phone within reach      Functional  Assessment Tool Used: clinical Judgement Functional Limitation: Mobility: Walking and moving around Mobility: Walking and Moving Around Current Status [phone]): At least 40 percent but less than 60 percent impaired, limited or restricted Mobility: Walking and Moving Around Goal Status [phone]): At least 20 percent but less than 40 percent impaired, limited or restricted    Time: 1209-1240 PT Time Calculation (min) (ACUTE ONLY): 31 min   Charges:   PT Evaluation $Initial PT Evaluation Tier I: 1 Procedure PT Treatments $Gait Training: 8-22 mins   PT G Codes:   PT G-Codes **NOT FOR INPATIENT CLASS** Functional Assessment Tool Used: clinical Judgement Functional Limitation: Mobility: Walking and moving around Mobility: Walking and Moving Around Current Status [id number]): At least 40 percent but less than 60 percent impaired, limited or restricted Mobility: Walking and Moving Around Goal Status [phone]): At least 20 percent but less than 40 percent impaired, limited or restricted    [facility] 04/24/2015, 1:14 PM [place], [place] 04/24/2015

## 2015-04-24 NOTE — Care Management Note (Signed)
Case Management Note  Patient Details  Name: ELHADJ BAZAN MRN: [id number] Date of Birth: [birth year]  Subjective/Objective:                  Spoke with patient and neighbor [name] [phone] at the bedside. They stated that the patient is from home, and lives by himself. He fell at home prior to admission. He has a sister Tamela Oddi in [place] [phone], cell, and [phone] house who is his POA. He also has a son Obryant who lives in [place] who is on his way down [phone], or [phone]. OT stated that they are recommending SNF, and are confident that PT will as well. Eliezer Lofts CSW updated to recommendation.    Action/Plan:  CM will continue to follow for needs as disposition plan is made.  Expected Discharge Date:                  Expected Discharge Plan:  Skilled Nursing Facility  In-House Referral:  Clinical Social Work  Discharge planning Services  CM Consult  Post Acute Care Choice:    Choice offered to:     DME Arranged:    DME Agency:     HH Arranged:    [place] Agency:     Status of Service:  In process, will continue to follow  Medicare Important Message Given:    Date Medicare IM Given:    Medicare IM give by:    Date Additional Medicare IM Given:    Additional Medicare Important Message give by:     If discussed at [place] of Stay Meetings, dates discussed:    Additional Comments:  Carles Collet, RN 04/24/2015, 11:42 AM

## 2015-04-24 NOTE — NC FL2 (Signed)
[place] LEVEL OF CARE SCREENING TOOL     IDENTIFICATION  Patient Name: Raymond Medina Birthdate: 07-27-1926 Sex: male Admission Date (Current Location): 04/23/2015  [facility] and [place] Number: Herbalist and Address:  [facility] [place], [place] [address] [place], [place] [address]      Provider Number: [id number]  Attending Physician Name and Address:  Albertine Patricia, MD  Relative Name and Phone Number:  Jarif, Nong, [phone]    Current Level of Care: Hospital Recommended Level of Care: [place] Prior Approval Number:    Date Approved/Denied:   PASRR Number: [id number]  Discharge Plan: SNF    Current Diagnoses: Patient Active Problem List   Diagnosis Date Noted  . Dehydration 04/23/2015  . Syncope 04/23/2015  . Fall at home 04/23/2015  . Acute renal failure (ARF) ([place]) 04/23/2015  . Depression 04/23/2015  . Severe protein-calorie malnutrition ([place]) 04/23/2015  . Syncopal episodes 04/23/2015  . AKI (acute kidney injury) ([place])   . LBP (low back pain) 04/17/2015  . Blepharitis of eyelid of left eye 04/17/2015  . Thoracic spine pain 03/06/2015  . Urinary incontinence, male, stress 09/14/2014  . Dementia, alcoholic ([place]) [phone]  . Bursitis of elbow 01/02/2012  . Neuropathy of both feet ([place]) 10/23/2011  . URI (upper respiratory infection) 08/14/2011  . Anemia 03/06/2011  . Orthostatic hypotension 03/06/2011  . Fatigue 02/03/2011  . TIA (transient ischemic attack) 09/22/2010  . CVA (cerebral infarction) 09/22/2010  . PROSTATE CANCER, HX OF 06/23/2010  . GRIEF REACTION 03/25/2010  . PERSONAL HISTORY OF COLONIC POLYPS 10/31/2009  . CONSTIPATION, CHRONIC 09/17/2009  . INSOMNIA, CHRONIC 06/14/2009  . TOBACCO USE, QUIT 03/14/2009  . GERD 12/07/2008  . COPD (chronic obstructive pulmonary disease) ([place]) 08/08/2008  . IMPAIRED GLUCOSE TOLERANCE 08/03/2008  . Actinic keratosis 06/04/2008  .  Essential hypertension 08/24/2007  . PERIPHERAL VASCULAR DISEASE 08/24/2007  . OSTEOARTHRITIS 08/24/2007    Orientation ACTIVITIES/SOCIAL BLADDER RESPIRATION    Self, Place   (Family lives out of [place].) Continent Normal  BEHAVIORAL SYMPTOMS/MOOD NEUROLOGICAL BOWEL NUTRITION STATUS      Continent  (Regular Diet)  PHYSICIAN VISITS COMMUNICATION OF NEEDS Height & Weight Skin    Verbally   129 lbs. Bruising          AMBULATORY STATUS RESPIRATION    Assist extensive Normal      Personal Care Assistance Level of Assistance  Bathing, Feeding, Dressing Bathing Assistance: Maximum assistance Feeding assistance: Limited assistance Dressing Assistance: Limited assistance      Functional Limitations Info                SPECIAL CARE FACTORS FREQUENCY  PT (By licensed PT)     PT Frequency: 5x/week             Additional Factors Info  Code Status, Allergies Code Status Info: Full Allergies Info: Budesonide-formoterol Fumarate. Hydrocodone-acetaminophen,Omeprazole, Plavix, Ranitidine Hcl           Current Medications (04/24/2015): Current Facility-Administered Medications  Medication Dose Route Frequency Provider Last Rate Last Dose  . 0.9 %  sodium chloride infusion   Intravenous Continuous [name], NP 100 mL/hr at 04/24/15 1042    . acetaminophen (TYLENOL) tablet 650 mg  650 mg Oral Q6H PRN [name], NP       Or  . acetaminophen (TYLENOL) suppository 650 mg  650 mg Rectal Q6H PRN [name], NP      .  alum & mag hydroxide-simeth (MAALOX/MYLANTA) 200-200-20 MG/5ML suspension 30 mL  30 mL Oral Q6H PRN [name], NP      . amLODipine (NORVASC) tablet 5 mg  5 mg Oral Daily Albertine Patricia, MD      . aspirin EC tablet 81 mg  81 mg Oral Daily Romona Curls, RPH   81 mg at 04/24/15 1041  . docusate sodium (COLACE) capsule 100 mg  100 mg Oral BID [name], NP   100 mg at 04/24/15 1042  . enoxaparin (LOVENOX) injection 30 mg  30 mg  Subcutaneous Q24H [name], NP   30 mg at 04/23/15 1543  . erythromycin ophthalmic ointment 1 application  1 application Left Eye QHS [name], NP   1 application at [id number]  . feeding supplement (ENSURE ENLIVE) (ENSURE ENLIVE) liquid 237 mL  237 mL Oral BID BM Silver Huguenin Elgergawy, MD      . folic acid (FOLVITE) tablet 1 mg  1 mg Oral Daily [name], NP   1 mg at 04/24/15 1041  . gabapentin (NEURONTIN) capsule 100 mg  100 mg Oral QHS [name], NP   100 mg at 04/23/15 2319  . hydrALAZINE (APRESOLINE) injection 10 mg  10 mg Intravenous Q6H PRN [name], NP   10 mg at 04/23/15 1000  . LORazepam (ATIVAN) injection 0-4 mg  0-4 mg Intravenous Q6H [name], NP   0 mg at 04/23/15 1345   Followed by  . [START ON 04/25/2015] LORazepam (ATIVAN) injection 0-4 mg  0-4 mg Intravenous Q12H [name], NP      . LORazepam (ATIVAN) tablet 1 mg  1 mg Oral Q6H PRN [name], NP       Or  . LORazepam (ATIVAN) injection 1 mg  1 mg Intravenous Q6H PRN [name], NP      . multivitamin with minerals tablet 1 tablet  1 tablet Oral Daily [name], NP   1 tablet at 04/24/15 1041  . omega-3 acid ethyl esters (LOVAZA) capsule 1 g  1 g Oral Daily [name], NP   1 g at 04/24/15 1041  . ondansetron (ZOFRAN) tablet 4 mg  4 mg Oral Q6H PRN [name], NP       Or  . ondansetron [facility]) injection 4 mg  4 mg Intravenous Q6H PRN [name], NP      . oxyCODONE (Oxy IR/ROXICODONE) immediate release tablet 5 mg  5 mg Oral Q4H PRN [name], NP      . sodium chloride 0.9 % injection 3 mL  3 mL Intravenous Q12H [name], NP   3 mL at 04/23/15 2319  . thiamine (VITAMIN B-1) tablet 100 mg  100 mg Oral Daily [name], NP   100 mg at 04/24/15 1041  . traZODone (DESYREL) tablet 25-50 mg  25-50 mg Oral QHS PRN [name], NP       Do not use this list as official medication orders. Please verify with discharge  summary.  Discharge Medications:   Medication List    ASK your doctor about these medications        amLODipine-valsartan 5-160 MG tablet  Commonly known as:  EXFORGE  TAKE 1 TABLET BY MOUTH EVERY DAY     aspirin EC 81 MG tablet  Take 1 tablet (81 mg total) by mouth daily.  b complex vitamins tablet  Take 1 tablet by mouth daily.     celecoxib 100 MG capsule  Commonly known as:  CELEBREX  Take 1 capsule (100 mg total) by mouth 2 (two) times daily.     DEPEND OVERNIGHT BRIEFS MEDIUM Misc  Use bid     erythromycin ophthalmic ointment  Commonly known as:  ROMYCIN  Place 1 application into the left eye at bedtime.     Fish Oil 1000 MG Cpdr  Take by mouth daily.     gabapentin 100 MG capsule  Commonly known as:  NEURONTIN  TAKE 1-2 CAPSULES BY MOUTH AT BEDTIME FOR FEET BURNING     polyethylene glycol powder powder  Commonly known as:  GLYCOLAX/MIRALAX  1 SCOOP ONCE DAILY AS NEEDED FOR CONSTIPATION     traZODone 50 MG tablet  Commonly known as:  DESYREL  Take 0.5-1 tablets (25-50 mg total) by mouth at bedtime as needed for sleep.     triamcinolone cream 0.5 %  Commonly known as:  KENALOG  Apply topically 2 (two) times daily.     Vitamin D3 1000 UNITS Caps  Take by mouth daily.        Relevant Imaging Results:  Relevant Lab Results:  Recent Labs    Additional [place], LCSW

## 2015-04-25 DIAGNOSIS — E440 Moderate protein-calorie malnutrition: Secondary | ICD-10-CM

## 2015-04-25 LAB — BASIC METABOLIC PANEL
Anion gap: 9 (ref 5–15)
BUN: 28 mg/dL — AB (ref 6–20)
CHLORIDE: 113 mmol/L — AB (ref 101–111)
CO2: 20 mmol/L — ABNORMAL LOW (ref 22–32)
Calcium: 8.5 mg/dL — ABNORMAL LOW (ref 8.9–10.3)
Creatinine, Ser: 1.06 mg/dL (ref 0.61–1.24)
GFR calc Af Amer: >60 mL/min (ref >60)
GFR calc non Af Amer: >60 mL/min (ref >60)
GLUCOSE: 108 mg/dL — AB (ref 65–99)
POTASSIUM: 3 mmol/L — AB (ref 3.5–5.1)
Sodium: 142 mmol/L (ref 135–145)

## 2015-04-25 LAB — GLUCOSE, CAPILLARY: GLUCOSE-CAPILLARY: 98 mg/dL (ref 65–99)

## 2015-04-25 LAB — HEMOGLOBIN A1C
Hgb A1c MFr Bld: 5.4 % (ref 4.8–5.6)
Mean Plasma Glucose: 108 mg/dL

## 2015-04-25 LAB — MAGNESIUM: Magnesium: 1.8 mg/dL (ref 1.7–2.4)

## 2015-04-25 LAB — PHOSPHORUS: PHOSPHORUS: 1.8 mg/dL — AB (ref 2.5–4.6)

## 2015-04-25 LAB — CK: Total CK: 456 U/L — ABNORMAL HIGH (ref 49–397)

## 2015-04-25 MED ORDER — IRBESARTAN 300 MG PO TABS
150.0000 mg | ORAL_TABLET | Freq: Every day | ORAL | Status: DC
  Administered 2015-04-25 – 2015-04-26 (×2): 150 mg via ORAL
  Filled 2015-04-25 (×2): qty 1

## 2015-04-25 MED ORDER — POTASSIUM CHLORIDE CRYS ER 20 MEQ PO TBCR
40.0000 meq | EXTENDED_RELEASE_TABLET | ORAL | Status: AC
  Administered 2015-04-25 (×3): 40 meq via ORAL
  Filled 2015-04-25 (×3): qty 2

## 2015-04-25 MED ORDER — MAGNESIUM SULFATE IN D5W 10-5 MG/ML-% IV SOLN
1.0000 g | Freq: Once | INTRAVENOUS | Status: AC
  Administered 2015-04-25: 1 g via INTRAVENOUS
  Filled 2015-04-25: qty 100

## 2015-04-25 MED ORDER — SODIUM PHOSPHATE 3 MMOLE/ML IV SOLN
30.0000 mmol | Freq: Once | INTRAVENOUS | Status: AC
  Administered 2015-04-25: 30 mmol via INTRAVENOUS
  Filled 2015-04-25: qty 10

## 2015-04-25 NOTE — Progress Notes (Signed)
Patient Demographics  Raymond Medina, is a 81 y.o. male, DOB - 1926-09-14, [id number]  Admit date - 04/23/2015   Admitting Physician [name], NP  Outpatient Primary MD for the patient is [name], MD  LOS - 2   Chief Complaint  Patient presents with  . Fall        Subjective:   [name] today has, No headache, No chest pain, No abdominal pain - No Nausea,  No Cough - SOB. Reports generalized weakness, but he is feeling better today.  Assessment & Plan    Principal Problem:   Syncope Active Problems:   INSOMNIA, CHRONIC   Essential hypertension   COPD (chronic obstructive pulmonary disease) (HCC)   GERD   Dementia, alcoholic (HCC)   Blepharitis of eyelid of left eye   Dehydration   Fall at home   Acute renal failure (ARF) ([place])   Depression   Severe protein-calorie malnutrition (HCC)   Syncopal episodes   Malnutrition of moderate degree  Fall at home  - This is most likely mechanical fall more than syncope , as patient denies any loss of consciousness , reports muscle spasmodic right lower extremity prior to fall. - 2-D echo with EF 55-60%, no wall motion abnormality .  Acute on chronic renal failure stage III - Baseline creatinine 1.38, was 2.05 on admission most likely due to volume depletion and rhabdomyolysis. - Improving with IV fluids - Continue to hold ARB  Hypokalemia - Repleted, recheck in a.m.  Hypophosphatemia - Repleted, recheck in a.m.  Elevated troponin - Patient denies any chest pain or shortness of breath, troponins trending down this is most likely related to acute renal failure and mild rhabdomyolysis -  2-D echo EF 55-60%, no regional wall motion abnormality as discussed with Dr. Johnsie Cancel   Mild rhabdomyolysis - Continue with IV fluid -Total CK trending down significantly.  Hypertension  - overall poorly controlled, continue with Norvasc,  will resume on ARB  COPD  - No active wheezing   Protein calorie malnutrition  - Continue with ensure    GERD  - Continue with PPI     Blepharitis of eyelid of left eye -Continue preadmission Romycin started on 11/16 by PCP  History of alcohol abuse - Patient reports last drink 7-8 weeks ago, continue to monitor on CIWA   Code Status: full   Family Communication: none at bedside  Disposition Plan: will need SNF placement , hopefully can be discharged in a.m.  Procedures  None    Consults   None   Medications  Scheduled Meds: . amLODipine  5 mg Oral Daily  . aspirin EC  81 mg Oral Daily  . docusate sodium  100 mg Oral BID  . enoxaparin (LOVENOX) injection  30 mg Subcutaneous Q24H  . erythromycin  1 application Left Eye QHS  . feeding supplement (ENSURE ENLIVE)  237 mL Oral BID BM  . folic acid  1 mg Oral Daily  . gabapentin  100 mg Oral QHS  . LORazepam  0-4 mg Intravenous Q12H  . magnesium sulfate 1 - 4 g bolus IVPB  1 g Intravenous Once  . multivitamin with minerals  1 tablet Oral Daily  . omega-3 acid ethyl esters  1  g Oral Daily  . potassium chloride  40 mEq Oral Q4H  . sodium chloride  3 mL Intravenous Q12H  . sodium phosphate  Dextrose 5% IVPB  30 mmol Intravenous Once  . thiamine  100 mg Oral Daily   Continuous Infusions: . sodium chloride 100 mL/hr at 04/25/15 0833   PRN Meds:.acetaminophen **OR** acetaminophen, alum & mag hydroxide-simeth, hydrALAZINE, LORazepam **OR** LORazepam, ondansetron **OR** ondansetron (ZOFRAN) IV, traZODone  DVT Prophylaxis  Lovenox   Lab Results  Component Value Date   PLT 151 04/24/2015    Antibiotics    Anti-infectives    None          Objective:   Filed Vitals:   04/25/15 1036 04/25/15 1147 04/25/15 1421 04/25/15 1421  BP: 153/56 174/72  145/51  Pulse:  63  73  Temp:  97.7 F (36.5 C) 97.8 F (36.6 C)   TempSrc:  Oral Oral   Resp:  16  18  Height:  5\' 8"  (1.727 m)    Weight:      SpO2:  97%   99%    Wt Readings from Last 3 Encounters:  04/25/15 57.97 kg (127 lb 12.8 oz)  04/17/15 58.514 kg (129 lb)  03/26/15 58.06 kg (128 lb)     Intake/Output Summary (Last 24 hours) at 04/25/15 1501 Last data filed at 04/25/15 0911  Gross per 24 hour  Intake    450 ml  Output   1450 ml  Net  -1000 ml     Physical Exam  Awake Alert,  Endocrine oral mucosa Supple Neck,No JVD, Symmetrical Chest wall movement, Good air movement bilaterally, CTAB RRR,No Gallops,Rubs or new Murmurs, No Parasternal Heave +ve B.Sounds, Abd Soft, No tenderness, No organomegaly appriciated, No rebound - guarding or rigidity. No Cyanosis, Clubbing or edema,  senile purpura    Data Review   Micro Results No results found for this or any previous visit (from the past 240 hour(s)).  Radiology Reports Ct Abdomen Pelvis Wo Contrast  04/23/2015  CLINICAL DATA:  Recent fall with left-sided body pain, initial encounter EXAM: CT CHEST, ABDOMEN AND PELVIS WITHOUT CONTRAST TECHNIQUE: Multidetector CT imaging of the chest, abdomen and pelvis was performed following the standard protocol without IV contrast. COMPARISON:  None. FINDINGS: CT CHEST FINDINGS Mediastinum/Lymph Nodes: Diffuse calcification of the thoracic aorta in its branches are noted. Coronary calcifications are seen as well. Multiple calcified right hilar lymph nodes are noted consistent with prior granulomatous disease. No acute abnormality is noted. Lungs/Pleura: Well aerated without evidence of focal infiltrate or sizable effusion. Diffuse emphysematous changes are noted. Additionally a few calcified granulomas are seen. Multiple calcified pleural plaques are seen. Musculoskeletal: Degenerative changes of the thoracic spine are noted. No acute abnormality is seen. CT ABDOMEN PELVIS FINDINGS Hepatobiliary: Within normal limits. Pancreas: Within normal limits. Spleen: Multiple calcified granulomas are seen. Adrenals/Urinary Tract: No renal calculi or  obstructive changes are noted. A large right renal cyst is noted which measures approximately 4.5 cm in greatest dimension. The adrenal glands are within normal limits. Stomach/Bowel: The appendix is not well visualized although no inflammatory changes are seen to suggest appendicitis. Scattered diverticular change is noted without evidence of diverticulitis. No obstructive changes are seen. Vascular/Lymphatic: Diffuse aortic calcifications are seen. Mild ectasia to 28 mm is noted in the infrarenal aorta. No significant lymphadenopathy is identified. Reproductive: Prostate brachytherapy seeds are noted. Other: Bladder is well distended.  No pelvic mass lesion is seen. Musculoskeletal: Degenerative changes of the lumbar spine are  noted. No acute bony abnormality is seen. IMPRESSION: CT of the chest: Chronic changes within the chest to include calcified granulomas,calcified pleural plaques and emphysematous changes. No acute abnormality is noted. CT of the abdomen and pelvis:  Right renal cyst. Findings of prior granulomatous disease. Diverticulosis without diverticulitis. Electronically Signed   By: Inez Catalina M.D.   On: 04/23/2015 12:25   Dg Chest 1 View  04/23/2015  CLINICAL DATA:  Pain in the left elbow due to fall 2 days ago. Initial encounter. EXAM: CHEST 1 VIEW COMPARISON:  02/02/2010 FINDINGS: Normal heart size and stable aortic tortuosity. Chronic subpleural reticulation at the bases. There is no edema, consolidation, effusion, or pneumothorax. Subtle angular density near the right hilum which is chronic, correlating with calcified pleural plaque based on 2007 chest CT. IMPRESSION: No active disease. Electronically Signed   By: Monte Fantasia M.D.   On: 04/23/2015 11:32   Dg Elbow Complete Left  04/23/2015  CLINICAL DATA:  Pain following fall 2 days prior EXAM: LEFT ELBOW - COMPLETE 3+ VIEW COMPARISON:  None. FINDINGS: Frontal, lateral, and bilateral oblique views were obtained. There is soft  tissue edema. There is no demonstrable fracture or dislocation. No appreciable elbow joint effusion. There is mild generalized osteoarthritic change. No erosive change. IMPRESSION: Soft tissue edema with mild generalized osteoarthritic change. No fracture or dislocation apparent. Electronically Signed   By: Lowella Grip III M.D.   On: 04/23/2015 11:37   Ct Head Wo Contrast  04/23/2015  CLINICAL DATA:  Fall. EXAM: CT HEAD WITHOUT CONTRAST CT CERVICAL SPINE WITHOUT CONTRAST TECHNIQUE: Multidetector CT imaging of the head and cervical spine was performed following the standard protocol without intravenous contrast. Multiplanar CT image reconstructions of the cervical spine were also generated. COMPARISON:  09/09/2010 FINDINGS: CT HEAD FINDINGS There is low attenuation throughout the subcortical and periventricular white matter compatible with chronic microvascular disease. Prominence of the sulci and ventricles identified consistent with brain atrophy. No acute intracranial hemorrhage, cortical infarct or mass. The paranasal sinuses and the mastoid air cells are clear. The calvarium is intact. CT CERVICAL SPINE FINDINGS Normal alignment of the cervical spine. The vertebral body heights are well preserved. The prevertebral soft tissue space is normal. The facet joints appear well-aligned. No fractures or dislocations identified. Extensive carotid artery calcifications identified bilaterally. Changes of emphysema identified within the lung apices. IMPRESSION: 1. No acute intracranial abnormality. 2. Chronic microvascular disease and brain atrophy. 3. No evidence for cervical spine fracture. 4. Carotid artery atherosclerotic calcifications. Electronically Signed   By: Kerby Moors M.D.   On: 04/23/2015 12:19   Ct Chest Wo Contrast  04/23/2015  CLINICAL DATA:  Recent fall with left-sided body pain, initial encounter EXAM: CT CHEST, ABDOMEN AND PELVIS WITHOUT CONTRAST TECHNIQUE: Multidetector CT imaging of  the chest, abdomen and pelvis was performed following the standard protocol without IV contrast. COMPARISON:  None. FINDINGS: CT CHEST FINDINGS Mediastinum/Lymph Nodes: Diffuse calcification of the thoracic aorta in its branches are noted. Coronary calcifications are seen as well. Multiple calcified right hilar lymph nodes are noted consistent with prior granulomatous disease. No acute abnormality is noted. Lungs/Pleura: Well aerated without evidence of focal infiltrate or sizable effusion. Diffuse emphysematous changes are noted. Additionally a few calcified granulomas are seen. Multiple calcified pleural plaques are seen. Musculoskeletal: Degenerative changes of the thoracic spine are noted. No acute abnormality is seen. CT ABDOMEN PELVIS FINDINGS Hepatobiliary: Within normal limits. Pancreas: Within normal limits. Spleen: Multiple calcified granulomas are seen. Adrenals/Urinary Tract: No  renal calculi or obstructive changes are noted. A large right renal cyst is noted which measures approximately 4.5 cm in greatest dimension. The adrenal glands are within normal limits. Stomach/Bowel: The appendix is not well visualized although no inflammatory changes are seen to suggest appendicitis. Scattered diverticular change is noted without evidence of diverticulitis. No obstructive changes are seen. Vascular/Lymphatic: Diffuse aortic calcifications are seen. Mild ectasia to 28 mm is noted in the infrarenal aorta. No significant lymphadenopathy is identified. Reproductive: Prostate brachytherapy seeds are noted. Other: Bladder is well distended.  No pelvic mass lesion is seen. Musculoskeletal: Degenerative changes of the lumbar spine are noted. No acute bony abnormality is seen. IMPRESSION: CT of the chest: Chronic changes within the chest to include calcified granulomas,calcified pleural plaques and emphysematous changes. No acute abnormality is noted. CT of the abdomen and pelvis:  Right renal cyst. Findings of prior  granulomatous disease. Diverticulosis without diverticulitis. Electronically Signed   By: Inez Catalina M.D.   On: 04/23/2015 12:25   Ct Cervical Spine Wo Contrast  04/23/2015  CLINICAL DATA:  Fall. EXAM: CT HEAD WITHOUT CONTRAST CT CERVICAL SPINE WITHOUT CONTRAST TECHNIQUE: Multidetector CT imaging of the head and cervical spine was performed following the standard protocol without intravenous contrast. Multiplanar CT image reconstructions of the cervical spine were also generated. COMPARISON:  09/09/2010 FINDINGS: CT HEAD FINDINGS There is low attenuation throughout the subcortical and periventricular white matter compatible with chronic microvascular disease. Prominence of the sulci and ventricles identified consistent with brain atrophy. No acute intracranial hemorrhage, cortical infarct or mass. The paranasal sinuses and the mastoid air cells are clear. The calvarium is intact. CT CERVICAL SPINE FINDINGS Normal alignment of the cervical spine. The vertebral body heights are well preserved. The prevertebral soft tissue space is normal. The facet joints appear well-aligned. No fractures or dislocations identified. Extensive carotid artery calcifications identified bilaterally. Changes of emphysema identified within the lung apices. IMPRESSION: 1. No acute intracranial abnormality. 2. Chronic microvascular disease and brain atrophy. 3. No evidence for cervical spine fracture. 4. Carotid artery atherosclerotic calcifications. Electronically Signed   By: Kerby Moors M.D.   On: 04/23/2015 12:19     CBC  Recent Labs Lab 04/23/15 1035 04/24/15 0503  WBC 11.9* 10.0  HGB 10.7* 9.9*  HCT 31.1* 28.2*  PLT 187 151  MCV 100.0 97.6  MCH 34.4* 34.3*  MCHC 34.4 35.1  RDW 14.0 13.7  LYMPHSABS 0.8  --   MONOABS 0.7  --   EOSABS 0.0  --   BASOSABS 0.0  --     Chemistries   Recent Labs Lab 04/23/15 1035 04/24/15 0503 04/25/15 0254 04/25/15 0800  NA 146* 143 142  --   K 3.8 3.2* 3.0*  --   CL  110 113* 113*  --   CO2 20* 21* 20*  --   GLUCOSE 106* 113* 108*  --   BUN 50* 37* 28*  --   CREATININE 2.05* 1.38* 1.06  --   CALCIUM 9.5 8.5* 8.5*  --   MG  --   --   --  1.8  AST 42*  --   --   --   ALT 23  --   --   --   ALKPHOS 53  --   --   --   BILITOT 1.4*  --   --   --    ------------------------------------------------------------------------------------------------------------------ estimated creatinine clearance is 41 mL/min (by C-G formula based on Cr of 1.06). ------------------------------------------------------------------------------------------------------------------  Recent Labs  04/23/15 1630  HGBA1C 5.4   ------------------------------------------------------------------------------------------------------------------ No results for input(s): CHOL, HDL, LDLCALC, TRIG, CHOLHDL, LDLDIRECT in the last 72 hours. ------------------------------------------------------------------------------------------------------------------  Recent Labs  04/23/15 1630  TSH 0.397   ------------------------------------------------------------------------------------------------------------------  Recent Labs  04/23/15 1630  VITAMINB12 1064*  FOLATE 65.1  FERRITIN 532*  TIBC 216*  IRON 20*  RETICCTPCT 1.3    Coagulation profile No results for input(s): INR, PROTIME in the last 168 hours.  No results for input(s): DDIMER in the last 72 hours.  Cardiac Enzymes  Recent Labs Lab 04/23/15 1630 04/23/15 2240 04/24/15 0503  TROPONINI 0.28* 0.21* 0.18*   ------------------------------------------------------------------------------------------------------------------ Invalid input(s): POCBNP     Time Spent in minutes   25 minutes   Nikala Walsworth M.D on 04/25/2015 at 3:01 PM  Between 7am to 7pm - Pager - [phone]  After 7pm go to [email or web address] - password [facility]  [facility]   Office  [phone]

## 2015-04-26 LAB — BASIC METABOLIC PANEL
Anion gap: 8 (ref 5–15)
BUN: 19 mg/dL (ref 6–20)
CALCIUM: 8.4 mg/dL — AB (ref 8.9–10.3)
CHLORIDE: 112 mmol/L — AB (ref 101–111)
CO2: 21 mmol/L — AB (ref 22–32)
CREATININE: 0.9 mg/dL (ref 0.61–1.24)
GFR calc non Af Amer: >60 mL/min (ref >60)
GLUCOSE: 100 mg/dL — AB (ref 65–99)
Potassium: 3.9 mmol/L (ref 3.5–5.1)
Sodium: 141 mmol/L (ref 135–145)

## 2015-04-26 LAB — GLUCOSE, CAPILLARY
GLUCOSE-CAPILLARY: 124 mg/dL — AB (ref 65–99)
Glucose-Capillary: 109 mg/dL — ABNORMAL HIGH (ref 65–99)

## 2015-04-26 LAB — PHOSPHORUS: Phosphorus: 3.6 mg/dL (ref 2.5–4.6)

## 2015-04-26 MED ORDER — THIAMINE HCL 100 MG PO TABS: 100.0000 mg | ORAL_TABLET | Freq: Every day | ORAL | Status: DC

## 2015-04-26 MED ORDER — FOLIC ACID 1 MG PO TABS: 1.0000 mg | ORAL_TABLET | Freq: Every day | ORAL | Status: DC

## 2015-04-26 MED ORDER — ENSURE ENLIVE PO LIQD: 237.0000 mL | Freq: Two times a day (BID) | ORAL | Status: DC

## 2015-04-26 NOTE — Discharge Summary (Addendum)
Raymond Medina, is a 81 y.o. male  DOB [birth year]  MRN [id number].  Admission date:  04/23/2015  Admitting Physician  [name], NP  Discharge Date:  04/26/2015   Primary MD  [name], MD  Recommendations for primary care physician for things to follow:  - please CBC, BMP, phosphorus in 3 days.   Admission Diagnosis  Dehydration [E86.0] Weakness [R53.1] Pain [R52] Fall [W19.XXXA] AKI (acute kidney injury) ([place]) [N17.9]   Discharge Diagnosis  Dehydration [E86.0] Weakness [R53.1] Pain [R52] Fall [W19.XXXA] AKI (acute kidney injury) ([place]) [N17.9]    Principal Problem:   Syncope Active Problems:   INSOMNIA, CHRONIC   Essential hypertension   COPD (chronic obstructive pulmonary disease) (HCC)   GERD   Dementia, alcoholic (HCC)   Blepharitis of eyelid of left eye   Dehydration   Fall at home   Acute renal failure (ARF) (HCC)   Depression   Severe protein-calorie malnutrition (HCC)   Syncopal episodes   Malnutrition of moderate degree      Past Medical History  Diagnosis Date  . Hypertension   . Low back pain   . Osteoarthritis   . Peripheral vascular disease ([place])   . Gout   . Giant cell arteritis ([place])   . COPD (chronic obstructive pulmonary disease) ([place])   . Allergic rhinitis   . GERD (gastroesophageal reflux disease)   . Tubulovillous adenoma polyp of colon     08/2001  . Hemorrhoids   . Radiation proctitis   . Prostate cancer ([place])   . BPH (benign prostatic hyperplasia)   . Diverticulosis   . INSOMNIA, CHRONIC 06/14/2009  . HYPERTENSION 08/24/2007  . PERIPHERAL VASCULAR DISEASE 08/24/2007  . BRONCHITIS, ACUTE 08/03/2008  . ALLERGIC RHINITIS 09/05/2008  . COPD 08/08/2008  . GERD 12/07/2008  . CONSTIPATION 10/31/2009  . Actinic keratosis 06/04/2008  . OSTEOARTHRITIS 08/24/2007  . NECK PAIN 08/24/2007  . LOW BACK PAIN 08/24/2007  . WEIGHT LOSS 09/17/2009  . IMPAIRED  GLUCOSE TOLERANCE 08/03/2008  . Contusion of forearm 09/17/2008  . 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 TOBACCO USE, QUIT 03/14/2009  . PROSTATE CANCER, HX OF 2000  . AVM (arteriovenous malformation)   . CVA (cerebral infarction) 09-09-10    Past Surgical History  Procedure Laterality Date  . Insertion prostate radiation seed      2000  . Cataract extraction, bilateral         History of present illness and  Hospital Course:     Kindly see H&P for history of present illness and admission details, please review complete Labs, Consult reports and Test reports for all details in brief  HPI  from the history and physical done on the day of admission on 04/23/2015  This is an 81 year old male with known history of hypertension, COPD, peripheral vascular disease, prostate cancer, CVA 2012, history of alcoholism with previously documented alcoholic dementia, grief reaction and resultant chronic depression after his wife died, unintentional weight loss and severe protein calorie malnutrition, chronic insomnia and  GERD who presents after experiencing a fall at home (Mechanically mediated or a syncopal event). Patient recalls falling at home 2 days prior and says that as he was walking forward he took a step and basically lost his balance. He does not have any dizziness or palpitations prior to the event. Unfortunately he was unable to get up and laid on the floor for 2 days. Today his neighbors noticed to paper still in the driveway which was unusual for the patient. They went into the home and heard him calling out for help and found him on the floor in a prone position. He had been incontinent of bowel and bladder at this point. EMS was called to the home and the patient was brought to the hospital. He was complaining of being thirsty, left-sided rib pain, left shoulder and arm pain.  The ER the patient was hypothermic with a rectal temperature of 96.5, BP was 169 or 59, pulse 63 and regular,  respirations 13 and room air saturations 100%. CT scan of the chest abdomen and pelvis as well as noncontrasted CT of the head were unremarkable for acute changes. Chest x-Sonnenfeld unremarkable. X-Allcock left elbow with soft tissue edema and mild generalized osteoarthritic change without fracture or dislocation. EKG revealed sinus rhythm without ischemic changes. Laboratory data was consistent with dehydration with a sodium of 146, BUN 50 and creatinine 2.05, total bilirubin 1.4 and AST of 42, total CK 821, troponin 0.51, white count was 11,900 hemoglobin 10.7 and platelets 187,000, neutrophils are 87% and absolute neutrophils are 10.5% urinalysis with moderate amount of bilirubin, moderate amount of hemoglobin, 40 ketones and 30 of protein. In the ER the patient has been given a liter of normal saline and 324 mg of aspirin. Patient did not pass bedside swallowing evaluation performed by RN.    Hospital Course  Fall at home  - This is most likely mechanical fall more than syncope , as patient denies any loss of consciousness , reports muscle spasmodic right lower extremity prior to fall. - 2-D echo with EF 55-60%, no wall motion abnormality .  Acute on chronic renal failure stage III - Baseline creatinine 1.38, was 2.05 on admission most likely due to volume depletion and rhabdomyolysis. - Resolved with IV fluids, creatinine is 0.9  Hypokalemia - Repleted , potassium of 3.9 on discharge  Hypophosphatemia - Repleted, phosphorus is 3.6 on discharge  Elevated troponin - Patient denies any chest pain or shortness of breath, troponins trending down this is most likely related to acute renal failure and mild rhabdomyolysis - 2-D echo EF 55-60%, no regional wall motion abnormality as discussed with Dr. Johnsie Cancel   Mild rhabdomyolysis - Patient was on IV fluid during hospital stay -Total CK trending down significantly.  Hypertension  -Resumed on home medication on discharge  COPD  - No active wheezing     Protein calorie malnutrition  - Continue with ensure   GERD  - Continue with PPI    Blepharitis of eyelid of left eye -Continue preadmission erythromycin started on 11/16 by PCP  History of alcohol abuse - Patient reports last drink 7-8 weeks ago, discussed with son, reports he cut significantly on alcohol consumption, but was still on 2-3 beers per day, was on CIWA protocol during hospital stay, no signs of withdrawals, will discharge on thiamine and folic acid    Discharge Condition:  Stable   Follow UP  Follow-up Information    Schedule an appointment as soon as possible for a visit  with [name], MD.   Specialty:  Internal Medicine   Why:  After discharge from SNF   Contact information:   [place] [id number] [phone]         Discharge Instructions  and  Discharge Medications     Discharge Instructions    Diet - low sodium heart healthy    Complete by:  As directed      Discharge instructions    Complete by:  As directed   Follow with Primary MD [name], MD after discharge from SNF  Get CBC, CMP,  checked  by Primary MD next visit.    Activity: As tolerated with Full fall precautions use walker/cane & assistance as needed   Disposition Home **   Diet: Heart Healthy , with feeding assistance and aspiration precautions.  For Heart failure patients - Check your Weight same time everyday, if you gain over 2 pounds, or you develop in leg swelling, experience more shortness of breath or chest pain, call your Primary MD immediately. Follow Cardiac Low Salt Diet and 1.5 lit/day fluid restriction.   On your next visit with your primary care physician please Get Medicines reviewed and adjusted.   Please request your Prim.MD to go over all Hospital Tests and Procedure/Radiological results at the follow up, please get all Hospital records sent to your Prim MD by signing hospital release before you go home.   If you  experience worsening of your admission symptoms, develop shortness of breath, life threatening emergency, suicidal or homicidal thoughts you must seek medical attention immediately by calling 911 or calling your MD immediately  if symptoms less severe.  You Must read complete instructions/literature along with all the possible adverse reactions/side effects for all the Medicines you take and that have been prescribed to you. Take any new Medicines after you have completely understood and accpet all the possible adverse reactions/side effects.   Do not drive, operating heavy machinery, perform activities at heights, swimming or participation in water activities or provide baby sitting services if your were admitted for syncope or siezures until you have seen by Primary MD or a Neurologist and advised to do so again.  Do not drive when taking Pain medications.    Do not take more than prescribed Pain, Sleep and Anxiety Medications  Special Instructions: If you have smoked or chewed Tobacco  in the last 2 yrs please stop smoking, stop any regular Alcohol  and or any Recreational drug use.  Wear Seat belts while driving.   Please note  You were cared for by a hospitalist during your hospital stay. If you have any questions about your discharge medications or the care you received while you were in the hospital after you are discharged, you can call the unit and asked to speak with the hospitalist on call if the hospitalist that took care of you is not available. Once you are discharged, your primary care physician will handle any further medical issues. Please note that NO REFILLS for any discharge medications will be authorized once you are discharged, as it is imperative that you return to your primary care physician (or establish a relationship with a primary care physician if you do not have one) for your aftercare needs so that they can reassess your need for medications and monitor your lab  values.     Increase activity slowly    Complete by:  As directed  Medication List    TAKE these medications        amLODipine-valsartan 5-160 MG tablet  Commonly known as:  EXFORGE  TAKE 1 TABLET BY MOUTH EVERY DAY     aspirin EC 81 MG tablet  Take 1 tablet (81 mg total) by mouth daily.     b complex vitamins tablet  Take 1 tablet by mouth daily.     celecoxib 100 MG capsule  Commonly known as:  CELEBREX  Take 1 capsule (100 mg total) by mouth 2 (two) times daily.     DEPEND OVERNIGHT BRIEFS MEDIUM Misc  Use bid     erythromycin ophthalmic ointment  Commonly known as:  ROMYCIN  Place 1 application into the left eye at bedtime.     feeding supplement (ENSURE ENLIVE) Liqd  Take 237 mLs by mouth 2 (two) times daily between meals.     Fish Oil 1000 MG Cpdr  Take by mouth daily.     folic acid 1 MG tablet  Commonly known as:  FOLVITE  Take 1 tablet (1 mg total) by mouth daily.     gabapentin 100 MG capsule  Commonly known as:  NEURONTIN  TAKE 1-2 CAPSULES BY MOUTH AT BEDTIME FOR FEET BURNING     polyethylene glycol powder powder  Commonly known as:  GLYCOLAX/MIRALAX  1 SCOOP ONCE DAILY AS NEEDED FOR CONSTIPATION     thiamine 100 MG tablet  Take 1 tablet (100 mg total) by mouth daily.     traZODone 50 MG tablet  Commonly known as:  DESYREL  Take 0.5-1 tablets (25-50 mg total) by mouth at bedtime as needed for sleep.     triamcinolone cream 0.5 %  Commonly known as:  KENALOG  Apply topically 2 (two) times daily.     Vitamin D3 1000 UNITS Caps  Take by mouth daily.          Diet and Activity recommendation: See Discharge Instructions above   Consults obtained -  None   Major procedures and Radiology Reports - PLEASE review detailed and final reports for all details, in brief -      Ct Abdomen Pelvis Wo Contrast  04/23/2015  CLINICAL DATA:  Recent fall with left-sided body pain, initial encounter EXAM: CT CHEST, ABDOMEN AND PELVIS  WITHOUT CONTRAST TECHNIQUE: Multidetector CT imaging of the chest, abdomen and pelvis was performed following the standard protocol without IV contrast. COMPARISON:  None. FINDINGS: CT CHEST FINDINGS Mediastinum/Lymph Nodes: Diffuse calcification of the thoracic aorta in its branches are noted. Coronary calcifications are seen as well. Multiple calcified right hilar lymph nodes are noted consistent with prior granulomatous disease. No acute abnormality is noted. Lungs/Pleura: Well aerated without evidence of focal infiltrate or sizable effusion. Diffuse emphysematous changes are noted. Additionally a few calcified granulomas are seen. Multiple calcified pleural plaques are seen. Musculoskeletal: Degenerative changes of the thoracic spine are noted. No acute abnormality is seen. CT ABDOMEN PELVIS FINDINGS Hepatobiliary: Within normal limits. Pancreas: Within normal limits. Spleen: Multiple calcified granulomas are seen. Adrenals/Urinary Tract: No renal calculi or obstructive changes are noted. A large right renal cyst is noted which measures approximately 4.5 cm in greatest dimension. The adrenal glands are within normal limits. Stomach/Bowel: The appendix is not well visualized although no inflammatory changes are seen to suggest appendicitis. Scattered diverticular change is noted without evidence of diverticulitis. No obstructive changes are seen. Vascular/Lymphatic: Diffuse aortic calcifications are seen. Mild ectasia to 28 mm is noted in the infrarenal aorta. No  significant lymphadenopathy is identified. Reproductive: Prostate brachytherapy seeds are noted. Other: Bladder is well distended.  No pelvic mass lesion is seen. Musculoskeletal: Degenerative changes of the lumbar spine are noted. No acute bony abnormality is seen. IMPRESSION: CT of the chest: Chronic changes within the chest to include calcified granulomas,calcified pleural plaques and emphysematous changes. No acute abnormality is noted. CT of the  abdomen and pelvis:  Right renal cyst. Findings of prior granulomatous disease. Diverticulosis without diverticulitis. Electronically Signed   By: Inez Catalina M.D.   On: 04/23/2015 12:25   Dg Chest 1 View  04/23/2015  CLINICAL DATA:  Pain in the left elbow due to fall 2 days ago. Initial encounter. EXAM: CHEST 1 VIEW COMPARISON:  02/02/2010 FINDINGS: Normal heart size and stable aortic tortuosity. Chronic subpleural reticulation at the bases. There is no edema, consolidation, effusion, or pneumothorax. Subtle angular density near the right hilum which is chronic, correlating with calcified pleural plaque based on 2007 chest CT. IMPRESSION: No active disease. Electronically Signed   By: Monte Fantasia M.D.   On: 04/23/2015 11:32   Dg Elbow Complete Left  04/23/2015  CLINICAL DATA:  Pain following fall 2 days prior EXAM: LEFT ELBOW - COMPLETE 3+ VIEW COMPARISON:  None. FINDINGS: Frontal, lateral, and bilateral oblique views were obtained. There is soft tissue edema. There is no demonstrable fracture or dislocation. No appreciable elbow joint effusion. There is mild generalized osteoarthritic change. No erosive change. IMPRESSION: Soft tissue edema with mild generalized osteoarthritic change. No fracture or dislocation apparent. Electronically Signed   By: Lowella Grip III M.D.   On: 04/23/2015 11:37   Ct Head Wo Contrast  04/23/2015  CLINICAL DATA:  Fall. EXAM: CT HEAD WITHOUT CONTRAST CT CERVICAL SPINE WITHOUT CONTRAST TECHNIQUE: Multidetector CT imaging of the head and cervical spine was performed following the standard protocol without intravenous contrast. Multiplanar CT image reconstructions of the cervical spine were also generated. COMPARISON:  09/09/2010 FINDINGS: CT HEAD FINDINGS There is low attenuation throughout the subcortical and periventricular white matter compatible with chronic microvascular disease. Prominence of the sulci and ventricles identified consistent with brain atrophy.  No acute intracranial hemorrhage, cortical infarct or mass. The paranasal sinuses and the mastoid air cells are clear. The calvarium is intact. CT CERVICAL SPINE FINDINGS Normal alignment of the cervical spine. The vertebral body heights are well preserved. The prevertebral soft tissue space is normal. The facet joints appear well-aligned. No fractures or dislocations identified. Extensive carotid artery calcifications identified bilaterally. Changes of emphysema identified within the lung apices. IMPRESSION: 1. No acute intracranial abnormality. 2. Chronic microvascular disease and brain atrophy. 3. No evidence for cervical spine fracture. 4. Carotid artery atherosclerotic calcifications. Electronically Signed   By: Kerby Moors M.D.   On: 04/23/2015 12:19   Ct Chest Wo Contrast  04/23/2015  CLINICAL DATA:  Recent fall with left-sided body pain, initial encounter EXAM: CT CHEST, ABDOMEN AND PELVIS WITHOUT CONTRAST TECHNIQUE: Multidetector CT imaging of the chest, abdomen and pelvis was performed following the standard protocol without IV contrast. COMPARISON:  None. FINDINGS: CT CHEST FINDINGS Mediastinum/Lymph Nodes: Diffuse calcification of the thoracic aorta in its branches are noted. Coronary calcifications are seen as well. Multiple calcified right hilar lymph nodes are noted consistent with prior granulomatous disease. No acute abnormality is noted. Lungs/Pleura: Well aerated without evidence of focal infiltrate or sizable effusion. Diffuse emphysematous changes are noted. Additionally a few calcified granulomas are seen. Multiple calcified pleural plaques are seen. Musculoskeletal: Degenerative changes of the  thoracic spine are noted. No acute abnormality is seen. CT ABDOMEN PELVIS FINDINGS Hepatobiliary: Within normal limits. Pancreas: Within normal limits. Spleen: Multiple calcified granulomas are seen. Adrenals/Urinary Tract: No renal calculi or obstructive changes are noted. A large right renal  cyst is noted which measures approximately 4.5 cm in greatest dimension. The adrenal glands are within normal limits. Stomach/Bowel: The appendix is not well visualized although no inflammatory changes are seen to suggest appendicitis. Scattered diverticular change is noted without evidence of diverticulitis. No obstructive changes are seen. Vascular/Lymphatic: Diffuse aortic calcifications are seen. Mild ectasia to 28 mm is noted in the infrarenal aorta. No significant lymphadenopathy is identified. Reproductive: Prostate brachytherapy seeds are noted. Other: Bladder is well distended.  No pelvic mass lesion is seen. Musculoskeletal: Degenerative changes of the lumbar spine are noted. No acute bony abnormality is seen. IMPRESSION: CT of the chest: Chronic changes within the chest to include calcified granulomas,calcified pleural plaques and emphysematous changes. No acute abnormality is noted. CT of the abdomen and pelvis:  Right renal cyst. Findings of prior granulomatous disease. Diverticulosis without diverticulitis. Electronically Signed   By: Inez Catalina M.D.   On: 04/23/2015 12:25   Ct Cervical Spine Wo Contrast  04/23/2015  CLINICAL DATA:  Fall. EXAM: CT HEAD WITHOUT CONTRAST CT CERVICAL SPINE WITHOUT CONTRAST TECHNIQUE: Multidetector CT imaging of the head and cervical spine was performed following the standard protocol without intravenous contrast. Multiplanar CT image reconstructions of the cervical spine were also generated. COMPARISON:  09/09/2010 FINDINGS: CT HEAD FINDINGS There is low attenuation throughout the subcortical and periventricular white matter compatible with chronic microvascular disease. Prominence of the sulci and ventricles identified consistent with brain atrophy. No acute intracranial hemorrhage, cortical infarct or mass. The paranasal sinuses and the mastoid air cells are clear. The calvarium is intact. CT CERVICAL SPINE FINDINGS Normal alignment of the cervical spine. The  vertebral body heights are well preserved. The prevertebral soft tissue space is normal. The facet joints appear well-aligned. No fractures or dislocations identified. Extensive carotid artery calcifications identified bilaterally. Changes of emphysema identified within the lung apices. IMPRESSION: 1. No acute intracranial abnormality. 2. Chronic microvascular disease and brain atrophy. 3. No evidence for cervical spine fracture. 4. Carotid artery atherosclerotic calcifications. Electronically Signed   By: Kerby Moors M.D.   On: 04/23/2015 12:19    Micro Results     No results found for this or any previous visit (from the past 240 hour(s)).     Today   Subjective:   Raymond Medina today has no headache,no chest abdominal pain,no new weakness tingling or numbness, feels much better  today.   Objective:   Blood pressure 108/45, pulse 66, temperature 97.8 F (36.6 C), temperature source Oral, resp. rate 16, height 5\' 8"  (1.727 m), weight 61.236 kg (135 lb), SpO2 100 %.   Intake/Output Summary (Last 24 hours) at 04/26/15 1134 Last data filed at 04/26/15 1027  Gross per 24 hour  Intake 2367.5 ml  Output   1026 ml  Net 1341.5 ml    Exam Awake Alert,  Endocrine oral mucosa Supple Neck,No JVD, Symmetrical Chest wall movement, Good air movement bilaterally, CTAB RRR,No Gallops,Rubs or new Murmurs, No Parasternal Heave +ve B.Sounds, Abd Soft, No tenderness, No organomegaly appriciated, No rebound - guarding or rigidity. No Cyanosis, Clubbing or edema,   Data Review   CBC w Diff: Lab Results  Component Value Date   WBC 10.0 04/24/2015   HGB 9.9* 04/24/2015   HCT 28.2* 04/24/2015  PLT 151 04/24/2015   LYMPHOPCT 7 04/23/2015   MONOPCT 6 04/23/2015   EOSPCT 0 04/23/2015   BASOPCT 0 04/23/2015    CMP: Lab Results  Component Value Date   NA 141 04/26/2015   K 3.9 04/26/2015   CL 112* 04/26/2015   CO2 21* 04/26/2015   BUN 19 04/26/2015   CREATININE 0.90 04/26/2015    PROT 6.1* 04/23/2015   ALBUMIN 4.0 04/23/2015   BILITOT 1.4* 04/23/2015   ALKPHOS 53 04/23/2015   AST 42* 04/23/2015   ALT 23 04/23/2015  .   Total Time in preparing paper work, data evaluation and todays exam - 35 minutes  Ronee Ranganathan M.D on 04/26/2015 at 11:34 AM  [facility]   Office  [phone]

## 2015-04-26 NOTE — Progress Notes (Signed)
Pt's BP was 171/53. PRN hydralazine is for parameters SBP >180. MD notified.

## 2015-04-26 NOTE — Discharge Instructions (Signed)
Follow with Primary MD [name], MD after discharge from SNF  Get CBC, CMP, 2 view Chest X [place] checked  by Primary MD next visit.    Activity: As tolerated with Full fall precautions use walker/cane & assistance as needed   Disposition Home    Diet: Heart Healthy  , with feeding assistance and aspiration precautions.  For Heart failure patients - Check your Weight same time everyday, if you gain over 2 pounds, or you develop in leg swelling, experience more shortness of breath or chest pain, call your Primary MD immediately. Follow Cardiac Low Salt Diet and 1.5 lit/day fluid restriction.   On your next visit with your primary care physician please Get Medicines reviewed and adjusted.   Please request your Prim.MD to go over all Hospital Tests and Procedure/Radiological results at the follow up, please get all Hospital records sent to your Prim MD by signing hospital release before you go home.   If you experience worsening of your admission symptoms, develop shortness of breath, life threatening emergency, suicidal or homicidal thoughts you must seek medical attention immediately by calling 911 or calling your MD immediately  if symptoms less severe.  You Must read complete instructions/literature along with all the possible adverse reactions/side effects for all the Medicines you take and that have been prescribed to you. Take any new Medicines after you have completely understood and accpet all the possible adverse reactions/side effects.   Do not drive, operating heavy machinery, perform activities at heights, swimming or participation in water activities or provide baby sitting services if your were admitted for syncope or siezures until you have seen by Primary MD or a Neurologist and advised to do so again.  Do not drive when taking Pain medications.    Do not take more than prescribed Pain, Sleep and Anxiety Medications  Special Instructions: If you have smoked or chewed  Tobacco  in the last 2 yrs please stop smoking, stop any regular Alcohol  and or any Recreational drug use.  Wear Seat belts while driving.   Please note  You were cared for by a hospitalist during your hospital stay. If you have any questions about your discharge medications or the care you received while you were in the hospital after you are discharged, you can call the unit and asked to speak with the hospitalist on call if the hospitalist that took care of you is not available. Once you are discharged, your primary care physician will handle any further medical issues. Please note that NO REFILLS for any discharge medications will be authorized once you are discharged, as it is imperative that you return to your primary care physician (or establish a relationship with a primary care physician if you do not have one) for your aftercare needs so that they can reassess your need for medications and monitor your lab values.

## 2015-04-26 NOTE — Care Management Note (Signed)
Case Management Note  Patient Details  Name: Raymond Medina MRN: [id number] Date of Birth: Mar 17, 1927  Subjective/Objective: Patient dc to snf, CSW following.                   Action/Plan:   Expected Discharge Date:                  Expected Discharge Plan:  Skilled Nursing Facility  In-House Referral:  Clinical Social Work  Discharge planning Services  CM Consult  Post Acute Care Choice:    Choice offered to:     DME Arranged:    DME Agency:     HH Arranged:    [place] Agency:     Status of Service:  Completed, signed off  Medicare Important Message Given:    Date Medicare IM Given:    Medicare IM give by:    Date Additional Medicare IM Given:    Additional Medicare Important Message give by:     If discussed at [place] of Stay Meetings, dates discussed:    Additional Comments:  Zenon Mayo, RN 04/26/2015, 3:20 PM

## 2015-04-26 NOTE — Progress Notes (Signed)
Pt prepared for d/c to SNF. IV d/c'd. Skin intact except as charted in most recent assessments. Vitals are stable. Report called to receiving facility. Pt to be transported by ambulance service. 

## 2015-04-26 NOTE — Progress Notes (Signed)
Physical Therapy Treatment Patient Details Name: Raymond Medina MRN: [id number] DOB: 04-02-1927 Today's Date: 04/26/2015    History of Present Illness 81 y.o. male with history of HTN, COPD, PVD, prostate cancer, CVA 2012 who presents to the ED for evaluation following a fall. Patient found lying prone after about 2 days.    PT Comments    Patient limited with ambulation due to weakness and imbalance.  May be resultant to not OOB yesterday RN reports was more groggy.  Will continue acute level skilled PT till d/c for SNF level rehab.  Follow Up Recommendations  SNF;Supervision/Assistance - 24 hour     Equipment Recommendations  None recommended by PT    Recommendations for Other Services       Precautions / Restrictions Precautions Precautions: Fall Restrictions Weight Bearing Restrictions: No    Mobility  Bed Mobility               General bed mobility comments: NT up in chair  Transfers   Equipment used: Rolling walker (2 wheeled) Transfers: Sit to/from Stand Sit to Stand: Mod assist         General transfer comment: increased posterior bias with first sit to stand and leaning R, but improved with subsequent transfers; max cues and assist on L to reach back for arms on chair  Ambulation/Gait Ambulation/Gait assistance: Mod assist Ambulation Distance (Feet): 8 Feet (x 2) Assistive device: Rolling walker (2 wheeled) Gait Pattern/deviations: Step-to pattern;Leaning posteriorly;Staggering right;Shuffle;Festinating Gait velocity: very slow; high fall risk Gait velocity interpretation: <1.8 ft/sec, indicative of risk for recurrent falls General Gait Details: initially walking pt with increased post lean, R LOB and difficulty progressing R foot; with assist for L weight shift to progress R LE pt with increased LOB so assisted to sit in chair.  After sitting up to [facility] with RW improved upright after reassessment of extremities and having pt reach for his  feet/   Stairs            Wheelchair Mobility    Modified Rankin (Stroke Patients Only)       Balance     Sitting balance-Leahy Scale: Fair   Postural control: Right lateral lean;Posterior lean Standing balance support: Bilateral upper extremity supported Standing balance-Leahy Scale: Poor                      Cognition Arousal/Alertness: Awake/alert Behavior During Therapy: Flat affect Overall Cognitive Status: No family/caregiver present to determine baseline cognitive functioning         Following Commands: Follows multi-step commands with increased time     Problem Solving: Slow processing;Decreased initiation;Requires verbal cues;Requires tactile cues      Exercises      General Comments        Pertinent Vitals/Pain Faces Pain Scale: Hurts even more Pain Location: L ribs with reaching down Pain Descriptors / Indicators: Sore Pain Intervention(s): Limited activity within patient's tolerance;Monitored during session    Home Living                      Prior Function            PT Goals (current goals can now be found in the care plan section) Progress towards PT goals: Not progressing toward goals - comment (increased balance issues, RN reports not OOB yesterday)    Frequency  Min 2X/week    PT Plan Current plan remains appropriate;Frequency needs to be updated  Co-evaluation             End of Session Equipment Utilized During Treatment: Gait belt Activity Tolerance: Patient limited by fatigue Patient left: in chair;with call bell/phone within reach;with chair alarm set     Time: [phone] PT Time Calculation (min) (ACUTE ONLY): 29 min  Charges:  $Gait Training: 8-22 mins $Neuromuscular Re-education: 8-22 mins                    G Codes:      Florenda Watt,CYNDI 05/23/15, 10:38 AM  Magda Kiel, PT [phone] May 23, 2015

## 2015-04-29 ENCOUNTER — Non-Acute Institutional Stay (SKILLED_NURSING_FACILITY): Payer: Medicare Other | Admitting: Adult Health

## 2015-04-29 DIAGNOSIS — M7022 Olecranon bursitis, left elbow: Secondary | ICD-10-CM

## 2015-04-29 DIAGNOSIS — I639 Cerebral infarction, unspecified: Secondary | ICD-10-CM | POA: Diagnosis not present

## 2015-04-29 DIAGNOSIS — F1097 Alcohol use, unspecified with alcohol-induced persisting dementia: Secondary | ICD-10-CM | POA: Diagnosis not present

## 2015-04-29 DIAGNOSIS — F1027 Alcohol dependence with alcohol-induced persisting dementia: Secondary | ICD-10-CM

## 2015-04-29 DIAGNOSIS — E43 Unspecified severe protein-calorie malnutrition: Secondary | ICD-10-CM

## 2015-04-29 DIAGNOSIS — I10 Essential (primary) hypertension: Secondary | ICD-10-CM

## 2015-04-29 DIAGNOSIS — G5793 Unspecified mononeuropathy of bilateral lower limbs: Secondary | ICD-10-CM | POA: Diagnosis not present

## 2015-04-30 ENCOUNTER — Non-Acute Institutional Stay (SKILLED_NURSING_FACILITY): Payer: Medicare Other | Admitting: Internal Medicine

## 2015-04-30 DIAGNOSIS — I10 Essential (primary) hypertension: Secondary | ICD-10-CM

## 2015-04-30 DIAGNOSIS — J439 Emphysema, unspecified: Secondary | ICD-10-CM | POA: Diagnosis not present

## 2015-04-30 DIAGNOSIS — F329 Major depressive disorder, single episode, unspecified: Secondary | ICD-10-CM

## 2015-04-30 DIAGNOSIS — D649 Anemia, unspecified: Secondary | ICD-10-CM | POA: Diagnosis not present

## 2015-04-30 DIAGNOSIS — F1097 Alcohol use, unspecified with alcohol-induced persisting dementia: Secondary | ICD-10-CM | POA: Diagnosis not present

## 2015-04-30 DIAGNOSIS — F32A Depression, unspecified: Secondary | ICD-10-CM

## 2015-04-30 DIAGNOSIS — N179 Acute kidney failure, unspecified: Secondary | ICD-10-CM

## 2015-04-30 DIAGNOSIS — H01006 Unspecified blepharitis left eye, unspecified eyelid: Secondary | ICD-10-CM | POA: Diagnosis not present

## 2015-04-30 DIAGNOSIS — E43 Unspecified severe protein-calorie malnutrition: Secondary | ICD-10-CM | POA: Diagnosis not present

## 2015-04-30 DIAGNOSIS — F1027 Alcohol dependence with alcohol-induced persisting dementia: Secondary | ICD-10-CM

## 2015-04-30 LAB — BASIC METABOLIC PANEL
BUN: 23 mg/dL — AB (ref 4–21)
Creatinine: 0.9 mg/dL (ref 0.6–1.3)
Glucose: 83 mg/dL
POTASSIUM: 3.9 mmol/L (ref 3.4–5.3)
Sodium: 141 mmol/L (ref 137–147)

## 2015-04-30 LAB — CBC AND DIFFERENTIAL
HEMATOCRIT: 28 % — AB (ref 41–53)
HEMOGLOBIN: 10 g/dL — AB (ref 13.5–17.5)
PLATELETS: 207 10*3/uL (ref 150–399)
WBC: 9.7 10*3/mL

## 2015-04-30 NOTE — Progress Notes (Signed)
Patient ID: Raymond Medina, male   DOB: 09-28-1926, 81 y.o.   MRN: [id number]    HISTORY AND PHYSICAL   DATE: 04/30/15  Location:  [facility]    Place of Service: SNF [phone])   Extended Emergency Contact Information Primary Emergency Contact: Morrison [place] Phone: [phone] Mobile Phone: [phone] Relation: Neighbor Secondary Emergency Contact: Willadean [place] Phone: [phone] Relation: Sister   Chief Complaint  Patient presents with  . New Admit To SNF    HPI:  81 yo male seen today as a new admission to SNF following hospital stay for syncope, dehydration, weakness, pain s/p fall and AKI. Na 146 and Cr 2.05 on admission. CK 821 with Trp 0.51, hgb 10.7. WBC 11.9 with abs neutrophil 10.5%. CT head no acute process. Left elbow xray showed ST edema and mild OA but no fx or dislocation. 2D echo EF 55-60% with no wall motion abnormality. Cr 0.9 at d/c. Electrolytes repleted. Left eyelid blepharitis treated with e-mycin ointment. He is still actively drinking upon admission to hospital.  Dementia - due to past hx Etoh abuse  HTN - stable on exforge  Insomnia/depression - mood stable on trazodone  Etoh abuse - no Etoh in several days but has been heavy drinker in th past. Takes thiamine, folate. He also takes gabapentin for neuropathy  Severe protein calorie malnutrition - due to Etoh abuse  GERD - stable on no meds  COPD -  Stable. Takes no meds  Hx CVA - in 2003. On ASA daily  PAD - stable on ASA daily  Chronic pain - takes celebrex   Past Medical History  Diagnosis Date  . Hypertension   . Low back pain   . Osteoarthritis   . Peripheral vascular disease ([place])   . Gout   . Giant cell arteritis ([place])   . COPD (chronic obstructive pulmonary disease) ([place])   . Allergic rhinitis   . GERD (gastroesophageal reflux disease)   . Tubulovillous adenoma polyp of colon     08/2001  .  Hemorrhoids   . Radiation proctitis   . Prostate cancer ([place])   . BPH (benign prostatic hyperplasia)   . Diverticulosis   . INSOMNIA, CHRONIC 06/14/2009  . HYPERTENSION 08/24/2007  . PERIPHERAL VASCULAR DISEASE 08/24/2007  . BRONCHITIS, ACUTE 08/03/2008  . ALLERGIC RHINITIS 09/05/2008  . COPD 08/08/2008  . GERD 12/07/2008  . CONSTIPATION 10/31/2009  . Actinic keratosis 06/04/2008  . OSTEOARTHRITIS 08/24/2007  . NECK PAIN 08/24/2007  . LOW BACK PAIN 08/24/2007  . WEIGHT LOSS 09/17/2009  . IMPAIRED GLUCOSE TOLERANCE 08/03/2008  . Contusion of forearm 09/17/2008  . 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 TOBACCO USE, QUIT 03/14/2009  . PROSTATE CANCER, HX OF 2000  . AVM (arteriovenous malformation)   . CVA (cerebral infarction) 09-09-10    Past Surgical History  Procedure Laterality Date  . Insertion prostate radiation seed      2000  . Cataract extraction, bilateral      Patient Care Team: [name], MD as PCP - General Lavell Anchors, MD as Consulting Physician (Ophthalmology)  Social History   Social History  . Marital Status: Widowed    Spouse Name: N/A  . Number of Children: N/A  . Years of Education: N/A   Occupational History  . Not on file.   Social History Main Topics  . Smoking status: Former Smoker    Types: Cigarettes  Quit date: 09/02/1970  . Smokeless tobacco: Not on file  . Alcohol Use: 16.8 oz/week    28 Cans of beer per week     Comment: 2-3 drinks daily ( no more than 4 beers/d)  . Drug Use: No  . Sexual Activity: Not Currently   Other Topics Concern  . Not on file   Social History Narrative   Retired    Former Smoker   Alcohol Use- yes 2-3 per day   Daily Caffeine use 4 per day   Illicit Drug use-no   Widow/Widower 2011     reports that he quit smoking about 44 years ago. His smoking use included Cigarettes. He does not have any smokeless tobacco history on file. He reports that he drinks about 16.8 oz of alcohol per week. He reports that  he does not use illicit drugs.  Family History  Problem Relation Age of Onset  . Stomach cancer Mother   . Hypertension Other   . Diabetes Other     Nephew  . Colon cancer Neg Hx    Family Status  Relation Status Death Age  . Mother Deceased   . Father Deceased     Immunization History  Administered Date(s) Administered  . Influenza Split 02/17/2012  . Influenza Whole 03/06/2008, 02/26/2009  . Influenza,inj,Quad PF,36+ Mos 02/09/2013, 02/19/2014, 03/06/2015  . Pneumococcal Conjugate-13 08/15/2013  . Pneumococcal Polysaccharide-23 06/14/2009  . Td 09/17/2008    Allergies  Allergen Reactions  . Budesonide-Formoterol Fumarate     REACTION: dizzy  . Hydrocodone-Acetaminophen     REACTION: prostate/ constipation  . Omeprazole     dizzy  . Plavix [Clopidogrel Bisulfate]     rash  . Ranitidine Hcl     REACTION: abd pain    Medications: Patient's Medications  New Prescriptions   No medications on file  Previous Medications   AMLODIPINE-VALSARTAN (EXFORGE) 5-160 MG TABLET    TAKE 1 TABLET BY MOUTH EVERY DAY   ASPIRIN EC 81 MG TABLET    Take 1 tablet (81 mg total) by mouth daily.   B COMPLEX VITAMINS TABLET    Take 1 tablet by mouth daily.   CELECOXIB (CELEBREX) 100 MG CAPSULE    Take 1 capsule (100 mg total) by mouth 2 (two) times daily.   CHOLECALCIFEROL (VITAMIN D3) 1000 UNITS CAPS    Take by mouth daily.     ERYTHROMYCIN (ROMYCIN) OPHTHALMIC OINTMENT    Place 1 application into the left eye at bedtime.   FEEDING SUPPLEMENT, ENSURE ENLIVE, (ENSURE ENLIVE) LIQD    Take 237 mLs by mouth 2 (two) times daily between meals.   FOLIC ACID (FOLVITE) 1 MG TABLET    Take 1 tablet (1 mg total) by mouth daily.   GABAPENTIN (NEURONTIN) 100 MG CAPSULE    TAKE 1-2 CAPSULES BY MOUTH AT BEDTIME FOR FEET BURNING   INCONTINENCE SUPPLY DISPOSABLE (DEPEND OVERNIGHT BRIEFS MEDIUM) MISC    Use bid   OMEGA-3 FATTY ACIDS (FISH OIL) 1000 MG CPDR    Take by mouth daily.     POLYETHYLENE GLYCOL  POWDER (GLYCOLAX/MIRALAX) POWDER    1 SCOOP ONCE DAILY AS NEEDED FOR CONSTIPATION   THIAMINE 100 MG TABLET    Take 1 tablet (100 mg total) by mouth daily.   TRAZODONE (DESYREL) 50 MG TABLET    Take 0.5-1 tablets (25-50 mg total) by mouth at bedtime as needed for sleep.   TRIAMCINOLONE CREAM (KENALOG) 0.5 %    Apply topically 2 (two) times  daily.  Modified Medications   No medications on file  Discontinued Medications   No medications on file    Review of Systems  Unable to perform ROS: Dementia    Filed Vitals:   04/30/15 1324  BP: 95/47  Pulse: 77  Temp: 98 F (36.7 C)  Weight: 137 lb (62.143 kg)  SpO2: 98%   Body mass index is 20.84 kg/(m^2).  Physical Exam  Constitutional: He appears well-developed.  Frail appearing in NAD. Sitting in w/c. Son at bedside  HENT:  Mouth/Throat: Oropharynx is clear and moist.  Eyes: Pupils are equal, round, and reactive to light. No scleral icterus.    Neck: Neck supple. Carotid bruit is not present.  Cardiovascular: Normal rate, regular rhythm, normal heart sounds and intact distal pulses.   Occasional extrasystoles are present. Exam reveals no gallop and no friction rub.   No murmur heard. +1 b/l distal LE swelling. No calf TTP  Pulmonary/Chest: Effort normal and breath sounds normal. He has no wheezes. He has no rales. He exhibits no tenderness.  Abdominal: Soft. Bowel sounds are normal. He exhibits no distension, no abdominal bruit, no pulsatile midline mass and no mass. There is no tenderness. There is no rebound and no guarding.  Musculoskeletal:  No left elbow swelling. ROM intact  Lymphadenopathy:    He has no cervical adenopathy.  Neurological: He is alert.  Skin: Skin is warm and dry. No rash noted.  Psychiatric: He has a normal mood and affect. His behavior is normal.     Labs reviewed:  CBC Latest Ref Rng 04/30/2015 04/24/2015 04/23/2015  WBC - 9.7 10.0 11.9(H)  Hemoglobin 13.5 - 17.5 g/dL 10.0(A) 9.9(L) 10.7(L)    Hematocrit 41 - 53 % 28(A) 28.2(L) 31.1(L)  Platelets 150 - 399 K/L 207 151 187    CMP Latest Ref Rng 04/30/2015 04/26/2015 04/25/2015  Glucose 65 - 99 mg/dL - 100(H) 108(H)  BUN 4 - 21 mg/dL 23(A) 19 28(H)  Creatinine 0.6 - 1.3 mg/dL 0.9 0.90 1.06  Sodium 137 - 147 mmol/L 141 141 142  Potassium 3.4 - 5.3 mmol/L 3.9 3.9 3.0(L)  Chloride 101 - 111 mmol/L - 112(H) 113(H)  CO2 22 - 32 mmol/L - 21(L) 20(L)  Calcium 8.9 - 10.3 mg/dL - 8.4(L) 8.5(L)  Total Protein 6.5 - 8.1 g/dL - - -  Total Bilirubin 0.3 - 1.2 mg/dL - - -  Alkaline Phos 38 - 126 U/L - - -  AST 15 - 41 U/L - - -  ALT 17 - 63 U/L - - -  Phosphorus   2.8   Admission on 04/23/2015, Discharged on 04/26/2015  Component Date Value Ref Range Status  . Sodium 04/23/2015 146* 135 - 145 mmol/L Final  . Potassium 04/23/2015 3.8  3.5 - 5.1 mmol/L Final  . Chloride 04/23/2015 110  101 - 111 mmol/L Final  . CO2 04/23/2015 20* 22 - 32 mmol/L Final  . Glucose, Bld 04/23/2015 106* 65 - 99 mg/dL Final  . BUN 04/23/2015 50* 6 - 20 mg/dL Final  . Creatinine, Ser 04/23/2015 2.05* 0.61 - 1.24 mg/dL Final  . Calcium 04/23/2015 9.5  8.9 - 10.3 mg/dL Final  . Total Protein 04/23/2015 6.1* 6.5 - 8.1 g/dL Final  . Albumin 04/23/2015 4.0  3.5 - 5.0 g/dL Final  . AST 04/23/2015 42* 15 - 41 U/L Final  . ALT 04/23/2015 23  17 - 63 U/L Final  . Alkaline Phosphatase 04/23/2015 53  38 - 126 U/L Final  .  Total Bilirubin 04/23/2015 1.4* 0.3 - 1.2 mg/dL Final  . GFR calc non Af Amer 04/23/2015 28* >60 mL/min Final  . GFR calc Af Amer 04/23/2015 32* >60 mL/min Final   Comment: (NOTE) The eGFR has been calculated using the CKD EPI equation. This calculation has not been validated in all clinical situations. eGFR's persistently <60 mL/min signify possible Chronic Kidney Disease.   . Anion gap 04/23/2015 16* 5 - 15 Final  . WBC 04/23/2015 11.9* 4.0 - 10.5 K/uL Final  . RBC 04/23/2015 3.11* 4.22 - 5.81 MIL/uL Final  . Hemoglobin 04/23/2015 10.7*  13.0 - 17.0 g/dL Final  . HCT 04/23/2015 31.1* 39.0 - 52.0 % Final  . MCV 04/23/2015 100.0  78.0 - 100.0 fL Final  . MCH 04/23/2015 34.4* 26.0 - 34.0 pg Final  . MCHC 04/23/2015 34.4  30.0 - 36.0 g/dL Final  . RDW 04/23/2015 14.0  11.5 - 15.5 % Final  . Platelets 04/23/2015 187  150 - 400 K/uL Final  . Neutrophils Relative % 04/23/2015 87   Final  . Neutro Abs 04/23/2015 10.5* 1.7 - 7.7 K/uL Final  . Lymphocytes Relative 04/23/2015 7   Final  . Lymphs Abs 04/23/2015 0.8  0.7 - 4.0 K/uL Final  . Monocytes Relative 04/23/2015 6   Final  . Monocytes Absolute 04/23/2015 0.7  0.1 - 1.0 K/uL Final  . Eosinophils Relative 04/23/2015 0   Final  . Eosinophils Absolute 04/23/2015 0.0  0.0 - 0.7 K/uL Final  . Basophils Relative 04/23/2015 0   Final  . Basophils Absolute 04/23/2015 0.0  0.0 - 0.1 K/uL Final  . Troponin i, poc 04/23/2015 0.51* 0.00 - 0.08 ng/mL Final  . Comment 04/23/2015 NOTIFIED PHYSICIAN   Final  . Comment 3 04/23/2015          Final   Comment: Due to the release kinetics of cTnI, a negative result within the first hours of the onset of symptoms does not rule out myocardial infarction with certainty. If myocardial infarction is still suspected, repeat the test at appropriate intervals.   . Glucose-Capillary 04/23/2015 91  65 - 99 mg/dL Final  . Color, Urine 04/23/2015 YELLOW  YELLOW Final  . APPearance 04/23/2015 CLEAR  CLEAR Final  . Specific Gravity, Urine 04/23/2015 1.024  1.005 - 1.030 Final  . pH 04/23/2015 5.0  5.0 - 8.0 Final  . Glucose, UA 04/23/2015 NEGATIVE  NEGATIVE mg/dL Final  . Hgb urine dipstick 04/23/2015 MODERATE* NEGATIVE Final  . Bilirubin Urine 04/23/2015 MODERATE* NEGATIVE Final  . Ketones, ur 04/23/2015 40* NEGATIVE mg/dL Final  . Protein, ur 04/23/2015 30* NEGATIVE mg/dL Final  . Nitrite 04/23/2015 NEGATIVE  NEGATIVE Final  . Leukocytes, UA 04/23/2015 NEGATIVE  NEGATIVE Final  . Total CK 04/23/2015 821* 49 - 397 U/L Final  . Squamous Epithelial /  LPF 04/23/2015 0-5* NONE SEEN Final   Please note change in reference range.  . WBC, UA 04/23/2015 0-5  0 - 5 WBC/hpf Final   Please note change in reference range.  . RBC / HPF 04/23/2015 0-5  0 - 5 RBC/hpf Final   Please note change in reference range.  . Bacteria, UA 04/23/2015 RARE* NONE SEEN Final   Please note change in reference range.  . Troponin I 04/23/2015 0.28* <0.031 ng/mL Final   Comment:        PERSISTENTLY INCREASED TROPONIN VALUES IN THE RANGE OF 0.04-0.49 ng/mL CAN BE SEEN IN:       -UNSTABLE ANGINA       -  CONGESTIVE HEART FAILURE       -MYOCARDITIS       -CHEST TRAUMA       -ARRYHTHMIAS       -LATE PRESENTING MYOCARDIAL INFARCTION       -COPD   CLINICAL FOLLOW-UP RECOMMENDED.   Marland Kitchen Troponin I 04/23/2015 0.21* <0.031 ng/mL Final   Comment:        PERSISTENTLY INCREASED TROPONIN VALUES IN THE RANGE OF 0.04-0.49 ng/mL CAN BE SEEN IN:       -UNSTABLE ANGINA       -CONGESTIVE HEART FAILURE       -MYOCARDITIS       -CHEST TRAUMA       -ARRYHTHMIAS       -LATE PRESENTING MYOCARDIAL INFARCTION       -COPD   CLINICAL FOLLOW-UP RECOMMENDED.   Marland Kitchen Opiates 04/24/2015 NONE DETECTED  NONE DETECTED Final  . Cocaine 04/24/2015 NONE DETECTED  NONE DETECTED Final  . Benzodiazepines 04/24/2015 NONE DETECTED  NONE DETECTED Final  . Amphetamines 04/24/2015 NONE DETECTED  NONE DETECTED Final  . Tetrahydrocannabinol 04/24/2015 NONE DETECTED  NONE DETECTED Final  . Barbiturates 04/24/2015 NONE DETECTED  NONE DETECTED Final   Comment:        DRUG SCREEN FOR MEDICAL PURPOSES ONLY.  IF CONFIRMATION IS NEEDED FOR ANY PURPOSE, NOTIFY LAB WITHIN 5 DAYS.        LOWEST DETECTABLE LIMITS FOR URINE DRUG SCREEN Drug Class       Cutoff (ng/mL) Amphetamine      1000 Barbiturate      200 Benzodiazepine   240 Tricyclics       973 Opiates          300 Cocaine          300 THC              50   . Alcohol, Ethyl (B) 04/23/2015 <5  <5 mg/dL Final   Comment:        LOWEST DETECTABLE  LIMIT FOR SERUM ALCOHOL IS 5 mg/dL FOR MEDICAL PURPOSES ONLY   . TSH 04/23/2015 0.397  0.350 - 4.500 uIU/mL Final  . Hgb A1c MFr Bld 04/23/2015 5.4  4.8 - 5.6 % Final   Comment: (NOTE)         Pre-diabetes: 5.7 - 6.4         Diabetes: >6.4         Glycemic control for adults with diabetes: <7.0   . Mean Plasma Glucose 04/23/2015 108   Final   Comment: (NOTE) Performed At: [facility] [place], [place] [id number] Lindon Romp MD ST:[id number]   . Vitamin B-12 04/23/2015 1064* 180 - 914 pg/mL Final   Comment: (NOTE) This assay is not validated for testing neonatal or myeloproliferative syndrome specimens for Vitamin B12 levels.   . Folate 04/23/2015 65.1  >5.9 ng/mL Final   RESULTS CONFIRMED BY MANUAL DILUTION  . Iron 04/23/2015 20* 45 - 182 ug/dL Final  . TIBC 04/23/2015 216* 250 - 450 ug/dL Final  . Saturation Ratios 04/23/2015 9* 17.9 - 39.5 % Final  . UIBC 04/23/2015 196   Final  . Ferritin 04/23/2015 532* 24 - 336 ng/mL Final  . Retic Ct Pct 04/23/2015 1.3  0.4 - 3.1 % Final  . RBC. 04/23/2015 3.00* 4.22 - 5.81 MIL/uL Final  . Retic Count, Manual 04/23/2015 39.0  19.0 - 186.0 K/uL Final  . Troponin I 04/24/2015 0.18* <0.031 ng/mL Final   Comment:  PERSISTENTLY INCREASED TROPONIN VALUES IN THE RANGE OF 0.04-0.49 ng/mL CAN BE SEEN IN:       -UNSTABLE ANGINA       -CONGESTIVE HEART FAILURE       -MYOCARDITIS       -CHEST TRAUMA       -ARRYHTHMIAS       -LATE PRESENTING MYOCARDIAL INFARCTION       -COPD   CLINICAL FOLLOW-UP RECOMMENDED.   Marland Kitchen Total CK 04/24/2015 700* 49 - 397 U/L Final  . Sodium 04/24/2015 143  135 - 145 mmol/L Final  . Potassium 04/24/2015 3.2* 3.5 - 5.1 mmol/L Final  . Chloride 04/24/2015 113* 101 - 111 mmol/L Final  . CO2 04/24/2015 21* 22 - 32 mmol/L Final  . Glucose, Bld 04/24/2015 113* 65 - 99 mg/dL Final  . BUN 04/24/2015 37* 6 - 20 mg/dL Final  . Creatinine, Ser 04/24/2015 1.38* 0.61 - 1.24 mg/dL Final  .  Calcium 04/24/2015 8.5* 8.9 - 10.3 mg/dL Final  . GFR calc non Af Amer 04/24/2015 45* >60 mL/min Final  . GFR calc Af Amer 04/24/2015 52* >60 mL/min Final   Comment: (NOTE) The eGFR has been calculated using the CKD EPI equation. This calculation has not been validated in all clinical situations. eGFR's persistently <60 mL/min signify possible Chronic Kidney Disease.   . Anion gap 04/24/2015 9  5 - 15 Final  . WBC 04/24/2015 10.0  4.0 - 10.5 K/uL Final  . RBC 04/24/2015 2.89* 4.22 - 5.81 MIL/uL Final  . Hemoglobin 04/24/2015 9.9* 13.0 - 17.0 g/dL Final  . HCT 04/24/2015 28.2* 39.0 - 52.0 % Final  . MCV 04/24/2015 97.6  78.0 - 100.0 fL Final  . MCH 04/24/2015 34.3* 26.0 - 34.0 pg Final  . MCHC 04/24/2015 35.1  30.0 - 36.0 g/dL Final  . RDW 04/24/2015 13.7  11.5 - 15.5 % Final  . Platelets 04/24/2015 151  150 - 400 K/uL Final  . Total CK 04/25/2015 456* 49 - 397 U/L Final  . Sodium 04/25/2015 142  135 - 145 mmol/L Final  . Potassium 04/25/2015 3.0* 3.5 - 5.1 mmol/L Final  . Chloride 04/25/2015 113* 101 - 111 mmol/L Final  . CO2 04/25/2015 20* 22 - 32 mmol/L Final  . Glucose, Bld 04/25/2015 108* 65 - 99 mg/dL Final  . BUN 04/25/2015 28* 6 - 20 mg/dL Final  . Creatinine, Ser 04/25/2015 1.06  0.61 - 1.24 mg/dL Final  . Calcium 04/25/2015 8.5* 8.9 - 10.3 mg/dL Final  . GFR calc non Af Amer 04/25/2015 >60  >60 mL/min Final  . GFR calc Af Amer 04/25/2015 >60  >60 mL/min Final   Comment: (NOTE) The eGFR has been calculated using the CKD EPI equation. This calculation has not been validated in all clinical situations. eGFR's persistently <60 mL/min signify possible Chronic Kidney Disease.   . Anion gap 04/25/2015 9  5 - 15 Final  . Magnesium 04/25/2015 1.8  1.7 - 2.4 mg/dL Final  . Phosphorus 04/25/2015 1.8* 2.5 - 4.6 mg/dL Final  . Glucose-Capillary 04/25/2015 98  65 - 99 mg/dL Final  . Sodium 04/26/2015 141  135 - 145 mmol/L Final  . Potassium 04/26/2015 3.9  3.5 - 5.1 mmol/L Final     DELTA CHECK NOTED  . Chloride 04/26/2015 112* 101 - 111 mmol/L Final  . CO2 04/26/2015 21* 22 - 32 mmol/L Final  . Glucose, Bld 04/26/2015 100* 65 - 99 mg/dL Final  . BUN 04/26/2015 19  6 - 20  mg/dL Final  . Creatinine, Ser 04/26/2015 0.90  0.61 - 1.24 mg/dL Final  . Calcium 04/26/2015 8.4* 8.9 - 10.3 mg/dL Final  . GFR calc non Af Amer 04/26/2015 >60  >60 mL/min Final  . GFR calc Af Amer 04/26/2015 >60  >60 mL/min Final   Comment: (NOTE) The eGFR has been calculated using the CKD EPI equation. This calculation has not been validated in all clinical situations. eGFR's persistently <60 mL/min signify possible Chronic Kidney Disease.   . Anion gap 04/26/2015 8  5 - 15 Final  . Phosphorus 04/26/2015 3.6  2.5 - 4.6 mg/dL Final  . Glucose-Capillary 04/26/2015 124* 65 - 99 mg/dL Final  . Glucose-Capillary 04/26/2015 109* 65 - 99 mg/dL Final    Ct Abdomen Pelvis Wo Contrast  04/23/2015  CLINICAL DATA:  Recent fall with left-sided body pain, initial encounter EXAM: CT CHEST, ABDOMEN AND PELVIS WITHOUT CONTRAST TECHNIQUE: Multidetector CT imaging of the chest, abdomen and pelvis was performed following the standard protocol without IV contrast. COMPARISON:  None. FINDINGS: CT CHEST FINDINGS Mediastinum/Lymph Nodes: Diffuse calcification of the thoracic aorta in its branches are noted. Coronary calcifications are seen as well. Multiple calcified right hilar lymph nodes are noted consistent with prior granulomatous disease. No acute abnormality is noted. Lungs/Pleura: Well aerated without evidence of focal infiltrate or sizable effusion. Diffuse emphysematous changes are noted. Additionally a few calcified granulomas are seen. Multiple calcified pleural plaques are seen. Musculoskeletal: Degenerative changes of the thoracic spine are noted. No acute abnormality is seen. CT ABDOMEN PELVIS FINDINGS Hepatobiliary: Within normal limits. Pancreas: Within normal limits. Spleen: Multiple calcified granulomas  are seen. Adrenals/Urinary Tract: No renal calculi or obstructive changes are noted. A large right renal cyst is noted which measures approximately 4.5 cm in greatest dimension. The adrenal glands are within normal limits. Stomach/Bowel: The appendix is not well visualized although no inflammatory changes are seen to suggest appendicitis. Scattered diverticular change is noted without evidence of diverticulitis. No obstructive changes are seen. Vascular/Lymphatic: Diffuse aortic calcifications are seen. Mild ectasia to 28 mm is noted in the infrarenal aorta. No significant lymphadenopathy is identified. Reproductive: Prostate brachytherapy seeds are noted. Other: Bladder is well distended.  No pelvic mass lesion is seen. Musculoskeletal: Degenerative changes of the lumbar spine are noted. No acute bony abnormality is seen. IMPRESSION: CT of the chest: Chronic changes within the chest to include calcified granulomas,calcified pleural plaques and emphysematous changes. No acute abnormality is noted. CT of the abdomen and pelvis:  Right renal cyst. Findings of prior granulomatous disease. Diverticulosis without diverticulitis. Electronically Signed   By: Inez Catalina M.D.   On: 04/23/2015 12:25   Dg Chest 1 View  04/23/2015  CLINICAL DATA:  Pain in the left elbow due to fall 2 days ago. Initial encounter. EXAM: CHEST 1 VIEW COMPARISON:  02/02/2010 FINDINGS: Normal heart size and stable aortic tortuosity. Chronic subpleural reticulation at the bases. There is no edema, consolidation, effusion, or pneumothorax. Subtle angular density near the right hilum which is chronic, correlating with calcified pleural plaque based on 2007 chest CT. IMPRESSION: No active disease. Electronically Signed   By: Monte Fantasia M.D.   On: 04/23/2015 11:32   Dg Elbow Complete Left  04/23/2015  CLINICAL DATA:  Pain following fall 2 days prior EXAM: LEFT ELBOW - COMPLETE 3+ VIEW COMPARISON:  None. FINDINGS: Frontal, lateral, and  bilateral oblique views were obtained. There is soft tissue edema. There is no demonstrable fracture or dislocation. No appreciable elbow joint effusion.  There is mild generalized osteoarthritic change. No erosive change. IMPRESSION: Soft tissue edema with mild generalized osteoarthritic change. No fracture or dislocation apparent. Electronically Signed   By: Lowella Grip III M.D.   On: 04/23/2015 11:37   Ct Head Wo Contrast  04/23/2015  CLINICAL DATA:  Fall. EXAM: CT HEAD WITHOUT CONTRAST CT CERVICAL SPINE WITHOUT CONTRAST TECHNIQUE: Multidetector CT imaging of the head and cervical spine was performed following the standard protocol without intravenous contrast. Multiplanar CT image reconstructions of the cervical spine were also generated. COMPARISON:  09/09/2010 FINDINGS: CT HEAD FINDINGS There is low attenuation throughout the subcortical and periventricular white matter compatible with chronic microvascular disease. Prominence of the sulci and ventricles identified consistent with brain atrophy. No acute intracranial hemorrhage, cortical infarct or mass. The paranasal sinuses and the mastoid air cells are clear. The calvarium is intact. CT CERVICAL SPINE FINDINGS Normal alignment of the cervical spine. The vertebral body heights are well preserved. The prevertebral soft tissue space is normal. The facet joints appear well-aligned. No fractures or dislocations identified. Extensive carotid artery calcifications identified bilaterally. Changes of emphysema identified within the lung apices. IMPRESSION: 1. No acute intracranial abnormality. 2. Chronic microvascular disease and brain atrophy. 3. No evidence for cervical spine fracture. 4. Carotid artery atherosclerotic calcifications. Electronically Signed   By: Kerby Moors M.D.   On: 04/23/2015 12:19   Ct Chest Wo Contrast  04/23/2015  CLINICAL DATA:  Recent fall with left-sided body pain, initial encounter EXAM: CT CHEST, ABDOMEN AND PELVIS  WITHOUT CONTRAST TECHNIQUE: Multidetector CT imaging of the chest, abdomen and pelvis was performed following the standard protocol without IV contrast. COMPARISON:  None. FINDINGS: CT CHEST FINDINGS Mediastinum/Lymph Nodes: Diffuse calcification of the thoracic aorta in its branches are noted. Coronary calcifications are seen as well. Multiple calcified right hilar lymph nodes are noted consistent with prior granulomatous disease. No acute abnormality is noted. Lungs/Pleura: Well aerated without evidence of focal infiltrate or sizable effusion. Diffuse emphysematous changes are noted. Additionally a few calcified granulomas are seen. Multiple calcified pleural plaques are seen. Musculoskeletal: Degenerative changes of the thoracic spine are noted. No acute abnormality is seen. CT ABDOMEN PELVIS FINDINGS Hepatobiliary: Within normal limits. Pancreas: Within normal limits. Spleen: Multiple calcified granulomas are seen. Adrenals/Urinary Tract: No renal calculi or obstructive changes are noted. A large right renal cyst is noted which measures approximately 4.5 cm in greatest dimension. The adrenal glands are within normal limits. Stomach/Bowel: The appendix is not well visualized although no inflammatory changes are seen to suggest appendicitis. Scattered diverticular change is noted without evidence of diverticulitis. No obstructive changes are seen. Vascular/Lymphatic: Diffuse aortic calcifications are seen. Mild ectasia to 28 mm is noted in the infrarenal aorta. No significant lymphadenopathy is identified. Reproductive: Prostate brachytherapy seeds are noted. Other: Bladder is well distended.  No pelvic mass lesion is seen. Musculoskeletal: Degenerative changes of the lumbar spine are noted. No acute bony abnormality is seen. IMPRESSION: CT of the chest: Chronic changes within the chest to include calcified granulomas,calcified pleural plaques and emphysematous changes. No acute abnormality is noted. CT of the  abdomen and pelvis:  Right renal cyst. Findings of prior granulomatous disease. Diverticulosis without diverticulitis. Electronically Signed   By: Inez Catalina M.D.   On: 04/23/2015 12:25   Ct Cervical Spine Wo Contrast  04/23/2015  CLINICAL DATA:  Fall. EXAM: CT HEAD WITHOUT CONTRAST CT CERVICAL SPINE WITHOUT CONTRAST TECHNIQUE: Multidetector CT imaging of the head and cervical spine was performed following the standard  protocol without intravenous contrast. Multiplanar CT image reconstructions of the cervical spine were also generated. COMPARISON:  09/09/2010 FINDINGS: CT HEAD FINDINGS There is low attenuation throughout the subcortical and periventricular white matter compatible with chronic microvascular disease. Prominence of the sulci and ventricles identified consistent with brain atrophy. No acute intracranial hemorrhage, cortical infarct or mass. The paranasal sinuses and the mastoid air cells are clear. The calvarium is intact. CT CERVICAL SPINE FINDINGS Normal alignment of the cervical spine. The vertebral body heights are well preserved. The prevertebral soft tissue space is normal. The facet joints appear well-aligned. No fractures or dislocations identified. Extensive carotid artery calcifications identified bilaterally. Changes of emphysema identified within the lung apices. IMPRESSION: 1. No acute intracranial abnormality. 2. Chronic microvascular disease and brain atrophy. 3. No evidence for cervical spine fracture. 4. Carotid artery atherosclerotic calcifications. Electronically Signed   By: Kerby Moors M.D.   On: 04/23/2015 12:19    Assessment/Plan   ICD-9-CM ICD-10-CM   1. 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 Blepharitis of eyelid of left eye - improving 373.00 H01.006   3. Anemia, unspecified anemia type -stable 285.9 D64.9   4. AKI (acute kidney injury) ([place]) - resolved 584.9 N17.9   5. Depression -  stable 311 F32.9   6. Essential hypertension- controlled 401.9 I10   7. Severe protein-calorie malnutrition ([place]) - stable 262 E43   8. Pulmonary emphysema, unspecified emphysema type ([place]) - stable 492.8 J43.9     Cont current meds as ordered including eyelid ointment  Cont nutritional supplements  PT/OT/ST as ordered  Lab results reviewed. Electrolytes, H/H and renal fxn stable  GOAL: short term rehab and d/c home when medically appropriate. Communicated with pt and nursing.  Will follow  Cera Rorke S. Perlie Gold  [facility] [address] [place], [place] [address] [phone] Cell (Monday-Friday 8 AM - 5 PM) [phone] After 5 PM and follow prompts

## 2015-05-02 ENCOUNTER — Encounter: Payer: Self-pay | Admitting: Internal Medicine

## 2015-05-17 ENCOUNTER — Non-Acute Institutional Stay (SKILLED_NURSING_FACILITY): Payer: Medicare Other | Admitting: Adult Health

## 2015-05-17 DIAGNOSIS — J0110 Acute frontal sinusitis, unspecified: Secondary | ICD-10-CM

## 2015-05-30 ENCOUNTER — Encounter: Payer: Self-pay | Admitting: Adult Health

## 2015-05-30 NOTE — Progress Notes (Signed)
Patient ID: [facility]ina, male   DOB: 26-Mar-1927, 81 y.o.   MRN: [id number]   Facility: [facility]      Allergies  Allergen Reactions  . Budesonide-Formoterol Fumarate     REACTION: dizzy  . Hydrocodone-Acetaminophen     REACTION: prostate/ constipation  . Omeprazole     dizzy  . Plavix [Clopidogrel Bisulfate]     rash  . Ranitidine Hcl     REACTION: abd pain    Chief Complaint  Patient presents with  . Hospitalization Follow-up    HPI:  He has been hospitalized after mechanical fall at home 2 days prior to his hospitalization. He was hospitalized for weakness dehydration. He has a history of cva in 2012; unintentional weight loss after the death of his spouse. He has documented alcoholic dementia and chronic insomnia. He is here for short term rehab with his goal to return back home.     Past Medical History  Diagnosis Date  . Hypertension   . Low back pain   . Osteoarthritis   . Peripheral vascular disease ([facility])   . Gout   . Giant cell arteritis ([facility])   . COPD (chronic obstructive pulmonary disease) ([facility])   . Allergic rhinitis   . GERD (gastroesophageal reflux disease)   . Tubulovillous adenoma polyp of colon     08/2001  . Hemorrhoids   . Radiation proctitis   . Prostate cancer ([facility])   . BPH (benign prostatic hyperplasia)   . Diverticulosis   . INSOMNIA, CHRONIC 06/14/2009  . HYPERTENSION 08/24/2007  . PERIPHERAL VASCULAR DISEASE 08/24/2007  . BRONCHITIS, ACUTE 08/03/2008  . ALLERGIC RHINITIS 09/05/2008  . COPD 08/08/2008  . GERD 12/07/2008  . CONSTIPATION 10/31/2009  . Actinic keratosis 06/04/2008  . OSTEOARTHRITIS 08/24/2007  . NECK PAIN 08/24/2007  . LOW BACK PAIN 08/24/2007  . WEIGHT LOSS 09/17/2009  . IMPAIRED GLUCOSE TOLERANCE 08/03/2008  . Contusion of forearm 09/17/2008  . 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 TOBACCO USE, QUIT 03/14/2009  . PROSTATE CANCER, HX OF 2000  . AVM (arteriovenous malformation)   . CVA (cerebral infarction) 09-09-10    Past  Surgical History  Procedure Laterality Date  . Insertion prostate radiation seed      2000  . Cataract extraction, bilateral      VITAL SIGNS BP 132/79 mmHg  Pulse 73  Ht 5\' 8"  (1.727 m)  Wt 137 lb (62.143 kg)  BMI 20.84 kg/m2  Patient's Medications  New Prescriptions   No medications on file  Previous Medications   AMLODIPINE-VALSARTAN (EXFORGE) 5-160 MG TABLET    TAKE 1 TABLET BY MOUTH EVERY DAY   ASPIRIN EC 81 MG TABLET    Take 1 tablet (81 mg total) by mouth daily.   B COMPLEX VITAMINS TABLET    Take 1 tablet by mouth daily.   CELECOXIB (CELEBREX) 100 MG CAPSULE    Take 1 capsule (100 mg total) by mouth 2 (two) times daily.   CHOLECALCIFEROL (VITAMIN D3) 1000 UNITS CAPS    Take by mouth daily.     ERYTHROMYCIN (ROMYCIN) OPHTHALMIC OINTMENT    Place 1 application into the left eye at bedtime.   FEEDING SUPPLEMENT, ENSURE ENLIVE, (ENSURE ENLIVE) LIQD    Take 237 mLs by mouth 2 (two) times daily between meals.   FOLIC ACID (FOLVITE) 1 MG TABLET    Take 1 tablet (1 mg total) by mouth daily.   GABAPENTIN (NEURONTIN) 100 MG CAPSULE    TAKE 1-2 CAPSULES  BY MOUTH AT BEDTIME FOR FEET BURNING   INCONTINENCE SUPPLY DISPOSABLE (DEPEND OVERNIGHT BRIEFS MEDIUM) MISC    Use bid   OMEGA-3 FATTY ACIDS (FISH OIL) 1000 MG CPDR    Take by mouth daily.     POLYETHYLENE GLYCOL POWDER (GLYCOLAX/MIRALAX) POWDER    1 SCOOP ONCE DAILY AS NEEDED FOR CONSTIPATION   THIAMINE 100 MG TABLET    Take 1 tablet (100 mg total) by mouth daily.   TRAZODONE (DESYREL) 50 MG TABLET    Take 0.5-1 tablets (25-50 mg total) by mouth at bedtime as needed for sleep.   TRIAMCINOLONE CREAM (KENALOG) 0.5 %    Apply topically 2 (two) times daily.  Modified Medications   No medications on file  Discontinued Medications   No medications on file     SIGNIFICANT DIAGNOSTIC EXAMS  04-23-15: chest x-[facility]: No active disease.  04-23-15: left elbow x-[facility]: Soft tissue edema with mild generalized osteoarthritic change.  No fracture or dislocation apparent.   04-23-15: ct of head and cervical spine: 1. No acute intracranial abnormality. 2. Chronic microvascular disease and brain atrophy. 3. No evidence for cervical spine fracture. 4. Carotid artery atherosclerotic calcifications.   04-23-15; ct of chest; abdomen and pelvis: CT of the chest: Chronic changes within the chest to include calcified granulomas,calcified pleural plaques and emphysematous changes. No acute abnormality is noted. CT of the abdomen and pelvis:  Right renal cyst. Findings of prior granulomatous disease. Diverticulosis without diverticulitis  04-24-15: 2-d echo: - Left ventricle: The cavity size was normal. Wall thickness was increased in a pattern of mild LVH. Systolic function was normal. The estimated ejection fraction was in the range of 55% to 60%. - Aortic valve: Mechanism of AR not apparent There was moderate regurgitation. - Atrial septum: No defect or patent foramen ovale was identified.   LABS REVIEWED:   04-23-15: wbc 11.9; hgb 10.7; hct 31.1; mcv 100.0; plt 187; glucose 106; bun 50; creat 2.05; k+ 3.8; na++146; liver normal albumin 4.0; CK 821; tsh 0.397; hgb a1c 5.4; vit B12: 1064; folate 65.1; iron 20; TIBC 216; ferritin 532;  04-26-15; glucose 100; bun 19; creat 0.90; k+ 3.9; na++141; phos 3.6     Review of Systems  Unable to perform ROS: dementia    Physical Exam  Constitutional: No distress.  Frail   Eyes: Conjunctivae are normal.  Neck: Neck supple. No JVD present. No thyromegaly present.  Cardiovascular: Normal rate, regular rhythm and intact distal pulses.   Respiratory: Effort normal and breath sounds normal. No respiratory distress. He has no wheezes.  GI: Soft. Bowel sounds are normal. He exhibits no distension. There is no tenderness.  Musculoskeletal: He exhibits no edema.  Able to move all extremities   Lymphadenopathy:    He has no cervical adenopathy.  Neurological: He is alert.  Skin: Skin is  warm and dry. He is not diaphoretic.  Psychiatric: He has a normal mood and affect.        ASSESSMENT/ PLAN:  1. Hypertension: will continue exofrge 5/160 mg daily   2. Constipation: will continue miralax daily as needed  3. CVA: is neurologically without change will continue asa 81 mg daily   4. Bursitis; left elbow: no swelling present; will continue celebrex 100 mg twice  daily   5. Alcoholic dementia: no significant change in his status; will continue vitamin supplement and will monitor his status.   6. Neuropathy in feet: will continue neurontin 100 mg nightly   7. Severe protein calorie malnutrition:  will continue supplements per facility protocol   8. Fall at home: will continue therapy as directed to improve his gait; balance and strength   Will check cbc bmp phos mag this week   Time spent with patient 50   minutes >50% time spent counseling; reviewing medical record; tests; labs; and developing future plan of care   Ok Edwards NP [facility]  Contact [phone] Monday through Friday 8am- 5pm  After hours call [phone]

## 2015-06-01 ENCOUNTER — Encounter: Payer: Self-pay | Admitting: Adult Health

## 2015-06-01 NOTE — Progress Notes (Signed)
Patient ID: Raymond Medina, male   DOB: 01/14/1927, 81 y.o.   MRN: [id number]    [facility]ility: [facility]      Allergies  Allergen Reactions  . Budesonide-Formoterol Fumarate     REACTION: dizzy  . Hydrocodone-Acetaminophen     REACTION: prostate/ constipation  . Omeprazole     dizzy  . Plavix [Clopidogrel Bisulfate]     rash  . Ranitidine Hcl     REACTION: abd pain    Chief Complaint  Patient presents with  . Acute Visit    cough     HPI:  He is complaining of a cough; with increased sinus congestion feels as though there is something dripping down the back of his throat. He has been feeling this way for the past several days. There are no reports of fever present. He tells me that he feels as though he has a sinus infection.   Past Medical History  Diagnosis Date  . Hypertension   . Low back pain   . Osteoarthritis   . Peripheral vascular disease ([place])   . Gout   . Giant cell arteritis ([place])   . COPD (chronic obstructive pulmonary disease) ([place])   . Allergic rhinitis   . GERD (gastroesophageal reflux disease)   . Tubulovillous adenoma polyp of colon     08/2001  . Hemorrhoids   . Radiation proctitis   . Prostate cancer ([place])   . BPH (benign prostatic hyperplasia)   . Diverticulosis   . INSOMNIA, CHRONIC 06/14/2009  . HYPERTENSION 08/24/2007  . PERIPHERAL VASCULAR DISEASE 08/24/2007  . BRONCHITIS, ACUTE 08/03/2008  . ALLERGIC RHINITIS 09/05/2008  . COPD 08/08/2008  . GERD 12/07/2008  . CONSTIPATION 10/31/2009  . Actinic keratosis 06/04/2008  . OSTEOARTHRITIS 08/24/2007  . NECK PAIN 08/24/2007  . LOW BACK PAIN 08/24/2007  . WEIGHT LOSS 09/17/2009  . IMPAIRED GLUCOSE TOLERANCE 08/03/2008  . Contusion of forearm 09/17/2008  . 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 TOBACCO USE, QUIT 03/14/2009  . PROSTATE CANCER, HX OF 2000  . AVM (arteriovenous malformation)   . CVA (cerebral infarction) 09-09-10    Past Surgical History  Procedure Laterality Date  . Insertion prostate  radiation seed      2000  . Cataract extraction, bilateral      VITAL SIGNS BP 116/50 mmHg  Pulse 68  Ht 5\' 8"  (1.727 m)  Wt 128 lb (58.06 kg)  BMI 19.47 kg/m2  SpO2 98%  Patient's Medications  New Prescriptions   No medications on file  Previous Medications   AMLODIPINE-VALSARTAN (EXFORGE) 5-160 MG TABLET    TAKE 1 TABLET BY MOUTH EVERY DAY   ASPIRIN EC 81 MG TABLET    Take 1 tablet (81 mg total) by mouth daily.   B COMPLEX VITAMINS TABLET    Take 1 tablet by mouth daily.   CELECOXIB (CELEBREX) 100 MG CAPSULE    Take 1 capsule (100 mg total) by mouth 2 (two) times daily.   CHOLECALCIFEROL (VITAMIN D3) 1000 UNITS CAPS    Take by mouth daily.     FEEDING SUPPLEMENT, ENSURE ENLIVE, (ENSURE ENLIVE) LIQD    Take 237 mLs by mouth 2 (two) times daily between meals.   FOLIC ACID (FOLVITE) 1 MG TABLET    Take 1 tablet (1 mg total) by mouth daily.   GABAPENTIN (NEURONTIN) 100 MG CAPSULE    TAKE 1-2 CAPSULES BY MOUTH AT BEDTIME FOR FEET BURNING   INCONTINENCE SUPPLY DISPOSABLE (DEPEND OVERNIGHT BRIEFS MEDIUM) MISC  Use bid   OMEGA-3 FATTY ACIDS (FISH OIL) 1000 MG CPDR    Take by mouth daily.     POLYETHYLENE GLYCOL POWDER (GLYCOLAX/MIRALAX) POWDER    1 SCOOP ONCE DAILY AS NEEDED FOR CONSTIPATION   THIAMINE 100 MG TABLET    Take 1 tablet (100 mg total) by mouth daily.   TRAZODONE (DESYREL) 50 MG TABLET    Take 0.5-1 tablets (25-50 mg total) by mouth at bedtime as needed for sleep.   TRIAMCINOLONE CREAM (KENALOG) 0.5 %    Apply topically 2 (two) times daily.  Modified Medications   No medications on file  Discontinued Medications     SIGNIFICANT DIAGNOSTIC EXAMS   04-23-15: chest x-[place]: No active disease.  04-23-15: left elbow x-[facility]: Soft tissue edema with mild generalized osteoarthritic change. No fracture or dislocation apparent.   04-23-15: ct of head and cervical spine: 1. No acute intracranial abnormality. 2. Chronic microvascular disease and brain atrophy. 3. No evidence  for cervical spine fracture. 4. Carotid artery atherosclerotic calcifications.   04-23-15; ct of chest; abdomen and pelvis: CT of the chest: Chronic changes within the chest to include calcified granulomas,calcified pleural plaques and emphysematous changes. No acute abnormality is noted. CT of the abdomen and pelvis:  Right renal cyst. Findings of prior granulomatous disease. Diverticulosis without diverticulitis  04-24-15: 2-d echo: - Left ventricle: The cavity size was normal. Wall thickness was increased in a pattern of mild LVH. Systolic function was normal. The estimated ejection fraction was in the range of 55% to 60%. - Aortic valve: Mechanism of AR not apparent There was moderate regurgitation. - Atrial septum: No defect or patent foramen ovale was identified.   LABS REVIEWED:   04-23-15: wbc 11.9; hgb 10.7; hct 31.1; mcv 100.0; plt 187; glucose 106; bun 50; creat 2.05; k+ 3.8; na++146; liver normal albumin 4.0; CK 821; tsh 0.397; hgb a1c 5.4; vit B12: 1064; folate 65.1; iron 20; TIBC 216; ferritin 532;  04-26-15; glucose 100; bun 19; creat 0.90; k+ 3.9; na++141; phos 3.6  11-28-116: wbc 9.7; hgb 10.0; hct 28.5; mcv 97.9; plt 207; glucose 83; bun 23; creat 0.86; k+ 3.9; na++141; phos 2.8    Review of Systems  Constitutional: Negative for malaise/fatigue.  HENT: Positive for congestion. Negative for ear pain and sore throat.   Eyes: Negative for discharge.  Respiratory: Positive for cough and shortness of breath.   Cardiovascular: Negative for chest pain and leg swelling.  Gastrointestinal: Negative for heartburn and abdominal pain.  Musculoskeletal: Negative for myalgias and joint pain.  Skin: Negative.   Psychiatric/Behavioral: The patient is not nervous/anxious.       Physical Exam  Constitutional: No distress.  Frail   Eyes: Conjunctivae are normal.  Neck: . No JVD present. No thyromegaly present. cervical lymph nodes enlarged does have facial tenderness present has  green sputum in back of throat  Cardiovascular: Normal rate, regular rhythm and intact distal pulses.   Respiratory: Effort normal and breath sounds normal. No respiratory distress. He has no wheezes.  GI: Soft. Bowel sounds are normal. He exhibits no distension. There is no tenderness.  Musculoskeletal: He exhibits no edema.  Able to move all extremities   Lymphadenopathy:    He has no cervical adenopathy.  Neurological: He is alert.  Skin: Skin is warm and dry. He is not diaphoretic.  Psychiatric: He has a normal mood and affect.      ASSESSMENT/ PLAN:  1. Acute sinusitis: will begin Z-pac will begin mucinex dm twice  daily for one week then twice daily as needed      Ok Edwards NP [facility]  Contact [phone] Monday through Friday 8am- 5pm  After hours call [phone]

## 2015-06-06 ENCOUNTER — Non-Acute Institutional Stay (SKILLED_NURSING_FACILITY): Payer: Medicare Other | Admitting: Adult Health

## 2015-06-06 DIAGNOSIS — F1097 Alcohol use, unspecified with alcohol-induced persisting dementia: Secondary | ICD-10-CM | POA: Diagnosis not present

## 2015-06-06 DIAGNOSIS — J439 Emphysema, unspecified: Secondary | ICD-10-CM | POA: Diagnosis not present

## 2015-06-06 DIAGNOSIS — I10 Essential (primary) hypertension: Secondary | ICD-10-CM

## 2015-06-06 DIAGNOSIS — F1027 Alcohol dependence with alcohol-induced persisting dementia: Secondary | ICD-10-CM

## 2015-06-06 DIAGNOSIS — I639 Cerebral infarction, unspecified: Secondary | ICD-10-CM | POA: Diagnosis not present

## 2015-06-06 DIAGNOSIS — M7022 Olecranon bursitis, left elbow: Secondary | ICD-10-CM | POA: Diagnosis not present

## 2015-06-18 ENCOUNTER — Ambulatory Visit: Payer: Medicare Other | Admitting: Internal Medicine

## 2015-06-27 ENCOUNTER — Non-Acute Institutional Stay (SKILLED_NURSING_FACILITY): Payer: Medicare Other | Admitting: Adult Health

## 2015-06-27 DIAGNOSIS — F1097 Alcohol use, unspecified with alcohol-induced persisting dementia: Secondary | ICD-10-CM

## 2015-06-27 DIAGNOSIS — I10 Essential (primary) hypertension: Secondary | ICD-10-CM

## 2015-06-27 DIAGNOSIS — F1027 Alcohol dependence with alcohol-induced persisting dementia: Secondary | ICD-10-CM

## 2015-06-27 DIAGNOSIS — J439 Emphysema, unspecified: Secondary | ICD-10-CM | POA: Diagnosis not present

## 2015-07-08 ENCOUNTER — Non-Acute Institutional Stay (SKILLED_NURSING_FACILITY): Payer: Medicare Other | Admitting: Adult Health

## 2015-07-08 DIAGNOSIS — F1097 Alcohol use, unspecified with alcohol-induced persisting dementia: Secondary | ICD-10-CM

## 2015-07-08 DIAGNOSIS — F1027 Alcohol dependence with alcohol-induced persisting dementia: Secondary | ICD-10-CM

## 2015-07-08 DIAGNOSIS — E43 Unspecified severe protein-calorie malnutrition: Secondary | ICD-10-CM

## 2015-07-08 DIAGNOSIS — J439 Emphysema, unspecified: Secondary | ICD-10-CM

## 2015-07-10 ENCOUNTER — Other Ambulatory Visit: Payer: Self-pay | Admitting: Internal Medicine

## 2015-07-10 ENCOUNTER — Ambulatory Visit (INDEPENDENT_AMBULATORY_CARE_PROVIDER_SITE_OTHER)
Admission: RE | Admit: 2015-07-10 | Discharge: 2015-07-10 | Disposition: A | Discharge status: Home / Self Care | Payer: Medicare Other | Source: Ambulatory Visit | Attending: Internal Medicine | Admitting: Internal Medicine

## 2015-07-10 ENCOUNTER — Other Ambulatory Visit (INDEPENDENT_AMBULATORY_CARE_PROVIDER_SITE_OTHER): Payer: Medicare Other

## 2015-07-10 ENCOUNTER — Ambulatory Visit (INDEPENDENT_AMBULATORY_CARE_PROVIDER_SITE_OTHER): Payer: Medicare Other | Admitting: Internal Medicine

## 2015-07-10 ENCOUNTER — Encounter: Payer: Self-pay | Admitting: Internal Medicine

## 2015-07-10 VITALS — BP 132/70 | HR 84 | Wt 129.0 lb

## 2015-07-10 DIAGNOSIS — R059 Cough, unspecified: Secondary | ICD-10-CM

## 2015-07-10 DIAGNOSIS — R202 Paresthesia of skin: Secondary | ICD-10-CM

## 2015-07-10 DIAGNOSIS — F1097 Alcohol use, unspecified with alcohol-induced persisting dementia: Secondary | ICD-10-CM

## 2015-07-10 DIAGNOSIS — Y92009 Unspecified place in unspecified non-institutional (private) residence as the place of occurrence of the external cause: Secondary | ICD-10-CM

## 2015-07-10 DIAGNOSIS — R270 Ataxia, unspecified: Secondary | ICD-10-CM

## 2015-07-10 DIAGNOSIS — I639 Cerebral infarction, unspecified: Secondary | ICD-10-CM

## 2015-07-10 DIAGNOSIS — D509 Iron deficiency anemia, unspecified: Secondary | ICD-10-CM

## 2015-07-10 DIAGNOSIS — D649 Anemia, unspecified: Secondary | ICD-10-CM

## 2015-07-10 DIAGNOSIS — Z79899 Other long term (current) drug therapy: Secondary | ICD-10-CM

## 2015-07-10 DIAGNOSIS — I10 Essential (primary) hypertension: Secondary | ICD-10-CM | POA: Diagnosis not present

## 2015-07-10 DIAGNOSIS — N393 Stress incontinence (female) (male): Secondary | ICD-10-CM

## 2015-07-10 DIAGNOSIS — R05 Cough: Secondary | ICD-10-CM

## 2015-07-10 DIAGNOSIS — Y92099 Unspecified place in other non-institutional residence as the place of occurrence of the external cause: Secondary | ICD-10-CM

## 2015-07-10 DIAGNOSIS — W19XXXA Unspecified fall, initial encounter: Secondary | ICD-10-CM

## 2015-07-10 DIAGNOSIS — F1027 Alcohol dependence with alcohol-induced persisting dementia: Secondary | ICD-10-CM

## 2015-07-10 LAB — BASIC METABOLIC PANEL
BUN: 32 mg/dL — AB (ref 6–23)
CALCIUM: 9.3 mg/dL (ref 8.4–10.5)
CO2: 24 meq/L (ref 19–32)
CREATININE: 1.08 mg/dL (ref 0.40–1.50)
Chloride: 108 mEq/L (ref 96–112)
GFR: 68.82 mL/min (ref >60.00)
Glucose, Bld: 110 mg/dL — ABNORMAL HIGH (ref 70–99)
Potassium: 4 mEq/L (ref 3.5–5.1)
Sodium: 140 mEq/L (ref 135–145)

## 2015-07-10 LAB — CBC WITH DIFFERENTIAL/PLATELET
Basophils Absolute: 0 10*3/uL (ref 0.0–0.1)
Basophils Relative: 0.2 % (ref 0.0–3.0)
EOS PCT: 2.2 % (ref 0.0–5.0)
Eosinophils Absolute: 0.3 10*3/uL (ref 0.0–0.7)
HEMATOCRIT: 27.7 % — AB (ref 39.0–52.0)
Hemoglobin: 9.1 g/dL — ABNORMAL LOW (ref 13.0–17.0)
LYMPHS ABS: 1.1 10*3/uL (ref 0.7–4.0)
LYMPHS PCT: 9.6 % — AB (ref 12.0–46.0)
MCHC: 32.8 g/dL (ref 30.0–36.0)
MCV: 96.8 fl (ref 78.0–100.0)
MONOS PCT: 6.2 % (ref 3.0–12.0)
Monocytes Absolute: 0.7 10*3/uL (ref 0.1–1.0)
NEUTROS ABS: 9.6 10*3/uL — AB (ref 1.4–7.7)
NEUTROS PCT: 81.8 % — AB (ref 43.0–77.0)
Platelets: 283 10*3/uL (ref 150.0–400.0)
RBC: 2.86 Mil/uL — AB (ref 4.22–5.81)
RDW: 14.4 % (ref 11.5–15.5)
WBC: 11.7 10*3/uL — ABNORMAL HIGH (ref 4.0–10.5)

## 2015-07-10 LAB — HEPATIC FUNCTION PANEL
ALBUMIN: 3.5 g/dL (ref 3.5–5.2)
ALK PHOS: 140 U/L — AB (ref 39–117)
ALT: 27 U/L (ref 0–53)
AST: 37 U/L (ref 0–37)
BILIRUBIN DIRECT: 0.2 mg/dL (ref 0.0–0.3)
TOTAL PROTEIN: 6.5 g/dL (ref 6.0–8.3)
Total Bilirubin: 0.7 mg/dL (ref 0.2–1.2)

## 2015-07-10 LAB — IBC PANEL
Iron: 26 ug/dL — ABNORMAL LOW (ref 42–165)
Saturation Ratios: 10 % — ABNORMAL LOW (ref 20.0–50.0)
TRANSFERRIN: 186 mg/dL — AB (ref 212.0–360.0)

## 2015-07-10 LAB — MAGNESIUM: MAGNESIUM: 2.3 mg/dL (ref 1.5–2.5)

## 2015-07-10 LAB — VITAMIN B12: Vitamin B-12: 991 pg/mL — ABNORMAL HIGH (ref 211–911)

## 2015-07-10 LAB — TSH: TSH: 1.51 u[IU]/mL (ref 0.35–4.50)

## 2015-07-10 MED ORDER — RISPERIDONE 0.25 MG PO TABS: 0.2500 mg | ORAL_TABLET | Freq: Two times a day (BID) | ORAL | Status: DC

## 2015-07-10 MED ORDER — FERROUS SULFATE 325 (65 FE) MG PO TABS: 325.0000 mg | ORAL_TABLET | Freq: Every day | ORAL | Status: DC

## 2015-07-10 NOTE — Progress Notes (Signed)
Subjective:  Patient ID: Raymond Medina, male    DOB: [birth year]  Age: 82 y.o. MRN: [id number]  CC: No chief complaint on file.   HPI Raymond Medina presents for SNF d/c f/u: Pt was sent home from [facility] on Monday for 24 h sitter/Home Care.  He is here w/his neighbor - Raymond Medina and his [facility] aid Raymond Medina.  Pt's sister from [place] is planning to take him to live w/her.  Pt's son gets chemo for throat cancer C/o confusion, ataxia, falls, weakness.  Outpatient Prescriptions Prior to Visit  Medication Sig Dispense Refill  . amLODipine-valsartan (EXFORGE) 5-160 MG tablet TAKE 1 TABLET BY MOUTH EVERY DAY 30 tablet 11  . aspirin EC 81 MG tablet Take 1 tablet (81 mg total) by mouth daily. 100 tablet 3  . b complex vitamins tablet Take 1 tablet by mouth daily. 100 tablet 3  . celecoxib (CELEBREX) 100 MG capsule Take 1 capsule (100 mg total) by mouth 2 (two) times daily. 30 capsule 1  . Cholecalciferol (VITAMIN D3) 1000 UNITS CAPS Take by mouth daily.      . feeding supplement, ENSURE ENLIVE, (ENSURE ENLIVE) LIQD Take 237 mLs by mouth 2 (two) times daily between meals. 123XX123 mL 12  . folic acid (FOLVITE) 1 MG tablet Take 1 tablet (1 mg total) by mouth daily.    Marland Kitchen gabapentin (NEURONTIN) 100 MG capsule TAKE 1-2 CAPSULES BY MOUTH AT BEDTIME FOR FEET BURNING 180 capsule 2  . Incontinence Supply Disposable (DEPEND OVERNIGHT BRIEFS MEDIUM) MISC Use bid 100 each 11  . Omega-3 Fatty Acids (FISH OIL) 1000 MG CPDR Take by mouth daily.      . polyethylene glycol powder (GLYCOLAX/MIRALAX) powder 1 SCOOP ONCE DAILY AS NEEDED FOR CONSTIPATION 527 g 2  . thiamine 100 MG tablet Take 1 tablet (100 mg total) by mouth daily.    . traZODone (DESYREL) 50 MG tablet Take 0.5-1 tablets (25-50 mg total) by mouth at bedtime as needed for sleep. 30 tablet 5  . triamcinolone cream (KENALOG) 0.5 % Apply topically 2 (two) times daily. 45 g 3   No facility-administered medications prior to visit.    ROS Review of  Systems  Constitutional: Negative for appetite change, fatigue and unexpected weight change.  HENT: Negative for congestion, ear pain, nosebleeds, sneezing, sore throat and trouble swallowing.   Eyes: Negative for itching and visual disturbance.  Respiratory: Negative for cough.   Cardiovascular: Negative for chest pain, palpitations and leg swelling.  Gastrointestinal: Negative for nausea, vomiting, diarrhea, blood in stool and abdominal distention.  Genitourinary: Positive for frequency and enuresis. Negative for hematuria.  Musculoskeletal: Positive for arthralgias and gait problem. Negative for back pain, joint swelling and neck pain.  Skin: Negative for rash.  Neurological: Positive for weakness. Negative for dizziness, tremors, seizures, syncope and speech difficulty.  Psychiatric/Behavioral: Positive for sleep disturbance, decreased concentration and agitation. Negative for suicidal ideas, confusion, self-injury and dysphoric mood. The patient is not nervous/anxious.     Objective:  BP 132/70 mmHg  Pulse 84  Wt 129 lb (58.514 kg)  SpO2 96%  BP Readings from Last 3 Encounters:  07/10/15 132/70  05/17/15 116/50  04/30/15 95/47    Wt Readings from Last 3 Encounters:  07/10/15 129 lb (58.514 kg)  05/17/15 128 lb (58.06 kg)  04/30/15 137 lb (62.143 kg)    Physical Exam  Constitutional: He appears well-developed. No distress.  NAD  HENT:  Mouth/Throat: Oropharynx is clear and moist.  Eyes: Conjunctivae are normal. Pupils are equal, round, and reactive to light.  Neck: Normal range of motion. No JVD present. No thyromegaly present.  Cardiovascular: Normal rate, regular rhythm, normal heart sounds and intact distal pulses.  Exam reveals no gallop and no friction rub.   No murmur heard. Pulmonary/Chest: Effort normal and breath sounds normal. No respiratory distress. He has no wheezes. He has no rales. He exhibits no tenderness.  Abdominal: Soft. Bowel sounds are normal. He  exhibits no distension and no mass. There is tenderness. There is no rebound and no guarding.  Musculoskeletal: Normal range of motion. He exhibits tenderness. He exhibits no edema.  Lymphadenopathy:    He has no cervical adenopathy.  Neurological: He is alert. He has normal reflexes. No cranial nerve deficit. He exhibits normal muscle tone. He displays a negative Romberg sign. Coordination abnormal. Gait normal.  Skin: Skin is warm and dry. No rash noted. No pallor.  Psychiatric: He has a normal mood and affect. His behavior is normal. Judgment and thought content normal.  alert, cooperative disoriented In a w/c Appear schronically ill A complex case  Lab Results  Component Value Date   WBC 9.7 04/30/2015   HGB 10.0* 04/30/2015   HCT 28* 04/30/2015   PLT 207 04/30/2015   GLUCOSE 100* 04/26/2015   CHOL 161 10/19/2013   TRIG 37.0 10/19/2013   HDL 77.00 10/19/2013   LDLCALC 77 10/19/2013   ALT 23 04/23/2015   AST 42* 04/23/2015   NA 141 04/30/2015   K 3.9 04/30/2015   CL 112* 04/26/2015   CREATININE 0.9 04/30/2015   BUN 23* 04/30/2015   CO2 21* 04/26/2015   TSH 0.397 04/23/2015   PSA 0.01* 06/23/2010   INR 1.01 09/09/2010   HGBA1C 5.4 04/23/2015    Ct Abdomen Pelvis Wo Contrast  04/23/2015  CLINICAL DATA:  Recent fall with left-sided body pain, initial encounter EXAM: CT CHEST, ABDOMEN AND PELVIS WITHOUT CONTRAST TECHNIQUE: Multidetector CT imaging of the chest, abdomen and pelvis was performed following the standard protocol without IV contrast. COMPARISON:  None. FINDINGS: CT CHEST FINDINGS Mediastinum/Lymph Nodes: Diffuse calcification of the thoracic aorta in its branches are noted. Coronary calcifications are seen as well. Multiple calcified right hilar lymph nodes are noted consistent with prior granulomatous disease. No acute abnormality is noted. Lungs/Pleura: Well aerated without evidence of focal infiltrate or sizable effusion. Diffuse emphysematous changes are noted.  Additionally a few calcified granulomas are seen. Multiple calcified pleural plaques are seen. Musculoskeletal: Degenerative changes of the thoracic spine are noted. No acute abnormality is seen. CT ABDOMEN PELVIS FINDINGS Hepatobiliary: Within normal limits. Pancreas: Within normal limits. Spleen: Multiple calcified granulomas are seen. Adrenals/Urinary Tract: No renal calculi or obstructive changes are noted. A large right renal cyst is noted which measures approximately 4.5 cm in greatest dimension. The adrenal glands are within normal limits. Stomach/Bowel: The appendix is not well visualized although no inflammatory changes are seen to suggest appendicitis. Scattered diverticular change is noted without evidence of diverticulitis. No obstructive changes are seen. Vascular/Lymphatic: Diffuse aortic calcifications are seen. Mild ectasia to 28 mm is noted in the infrarenal aorta. No significant lymphadenopathy is identified. Reproductive: Prostate brachytherapy seeds are noted. Other: Bladder is well distended.  No pelvic mass lesion is seen. Musculoskeletal: Degenerative changes of the lumbar spine are noted. No acute bony abnormality is seen. IMPRESSION: CT of the chest: Chronic changes within the chest to include calcified granulomas,calcified pleural plaques and emphysematous changes. No acute abnormality is noted.  CT of the abdomen and pelvis:  Right renal cyst. Findings of prior granulomatous disease. Diverticulosis without diverticulitis. Electronically Signed   By: Inez Catalina M.D.   On: 04/23/2015 12:25   Dg Chest 1 View  04/23/2015  CLINICAL DATA:  Pain in the left elbow due to fall 2 days ago. Initial encounter. EXAM: CHEST 1 VIEW COMPARISON:  02/02/2010 FINDINGS: Normal heart size and stable aortic tortuosity. Chronic subpleural reticulation at the bases. There is no edema, consolidation, effusion, or pneumothorax. Subtle angular density near the right hilum which is chronic, correlating with  calcified pleural plaque based on 2007 chest CT. IMPRESSION: No active disease. Electronically Signed   By: Monte Fantasia M.D.   On: 04/23/2015 11:32   Dg Elbow Complete Left  04/23/2015  CLINICAL DATA:  Pain following fall 2 days prior EXAM: LEFT ELBOW - COMPLETE 3+ VIEW COMPARISON:  None. FINDINGS: Frontal, lateral, and bilateral oblique views were obtained. There is soft tissue edema. There is no demonstrable fracture or dislocation. No appreciable elbow joint effusion. There is mild generalized osteoarthritic change. No erosive change. IMPRESSION: Soft tissue edema with mild generalized osteoarthritic change. No fracture or dislocation apparent. Electronically Signed   By: Lowella Grip III M.D.   On: 04/23/2015 11:37   Ct Head Wo Contrast  04/23/2015  CLINICAL DATA:  Fall. EXAM: CT HEAD WITHOUT CONTRAST CT CERVICAL SPINE WITHOUT CONTRAST TECHNIQUE: Multidetector CT imaging of the head and cervical spine was performed following the standard protocol without intravenous contrast. Multiplanar CT image reconstructions of the cervical spine were also generated. COMPARISON:  09/09/2010 FINDINGS: CT HEAD FINDINGS There is low attenuation throughout the subcortical and periventricular white matter compatible with chronic microvascular disease. Prominence of the sulci and ventricles identified consistent with brain atrophy. No acute intracranial hemorrhage, cortical infarct or mass. The paranasal sinuses and the mastoid air cells are clear. The calvarium is intact. CT CERVICAL SPINE FINDINGS Normal alignment of the cervical spine. The vertebral body heights are well preserved. The prevertebral soft tissue space is normal. The facet joints appear well-aligned. No fractures or dislocations identified. Extensive carotid artery calcifications identified bilaterally. Changes of emphysema identified within the lung apices. IMPRESSION: 1. No acute intracranial abnormality. 2. Chronic microvascular disease and  brain atrophy. 3. No evidence for cervical spine fracture. 4. Carotid artery atherosclerotic calcifications. Electronically Signed   By: Kerby Moors M.D.   On: 04/23/2015 12:19   Ct Chest Wo Contrast  04/23/2015  CLINICAL DATA:  Recent fall with left-sided body pain, initial encounter EXAM: CT CHEST, ABDOMEN AND PELVIS WITHOUT CONTRAST TECHNIQUE: Multidetector CT imaging of the chest, abdomen and pelvis was performed following the standard protocol without IV contrast. COMPARISON:  None. FINDINGS: CT CHEST FINDINGS Mediastinum/Lymph Nodes: Diffuse calcification of the thoracic aorta in its branches are noted. Coronary calcifications are seen as well. Multiple calcified right hilar lymph nodes are noted consistent with prior granulomatous disease. No acute abnormality is noted. Lungs/Pleura: Well aerated without evidence of focal infiltrate or sizable effusion. Diffuse emphysematous changes are noted. Additionally a few calcified granulomas are seen. Multiple calcified pleural plaques are seen. Musculoskeletal: Degenerative changes of the thoracic spine are noted. No acute abnormality is seen. CT ABDOMEN PELVIS FINDINGS Hepatobiliary: Within normal limits. Pancreas: Within normal limits. Spleen: Multiple calcified granulomas are seen. Adrenals/Urinary Tract: No renal calculi or obstructive changes are noted. A large right renal cyst is noted which measures approximately 4.5 cm in greatest dimension. The adrenal glands are within normal limits. Stomach/Bowel: The  appendix is not well visualized although no inflammatory changes are seen to suggest appendicitis. Scattered diverticular change is noted without evidence of diverticulitis. No obstructive changes are seen. Vascular/Lymphatic: Diffuse aortic calcifications are seen. Mild ectasia to 28 mm is noted in the infrarenal aorta. No significant lymphadenopathy is identified. Reproductive: Prostate brachytherapy seeds are noted. Other: Bladder is well distended.   No pelvic mass lesion is seen. Musculoskeletal: Degenerative changes of the lumbar spine are noted. No acute bony abnormality is seen. IMPRESSION: CT of the chest: Chronic changes within the chest to include calcified granulomas,calcified pleural plaques and emphysematous changes. No acute abnormality is noted. CT of the abdomen and pelvis:  Right renal cyst. Findings of prior granulomatous disease. Diverticulosis without diverticulitis. Electronically Signed   By: Inez Catalina M.D.   On: 04/23/2015 12:25   Ct Cervical Spine Wo Contrast  04/23/2015  CLINICAL DATA:  Fall. EXAM: CT HEAD WITHOUT CONTRAST CT CERVICAL SPINE WITHOUT CONTRAST TECHNIQUE: Multidetector CT imaging of the head and cervical spine was performed following the standard protocol without intravenous contrast. Multiplanar CT image reconstructions of the cervical spine were also generated. COMPARISON:  09/09/2010 FINDINGS: CT HEAD FINDINGS There is low attenuation throughout the subcortical and periventricular white matter compatible with chronic microvascular disease. Prominence of the sulci and ventricles identified consistent with brain atrophy. No acute intracranial hemorrhage, cortical infarct or mass. The paranasal sinuses and the mastoid air cells are clear. The calvarium is intact. CT CERVICAL SPINE FINDINGS Normal alignment of the cervical spine. The vertebral body heights are well preserved. The prevertebral soft tissue space is normal. The facet joints appear well-aligned. No fractures or dislocations identified. Extensive carotid artery calcifications identified bilaterally. Changes of emphysema identified within the lung apices. IMPRESSION: 1. No acute intracranial abnormality. 2. Chronic microvascular disease and brain atrophy. 3. No evidence for cervical spine fracture. 4. Carotid artery atherosclerotic calcifications. Electronically Signed   By: Kerby Moors M.D.   On: 04/23/2015 12:19    Assessment & Plan:   There are no  diagnoses linked to this encounter. I am having Mr. Heggs maintain his Fish Oil, Vitamin D3, polyethylene glycol powder, triamcinolone cream, b complex vitamins, aspirin EC, DEPEND OVERNIGHT BRIEFS MEDIUM, amLODipine-valsartan, celecoxib, gabapentin, traZODone, feeding supplement (ENSURE ENLIVE), folic acid, thiamine, and risperiDONE.  Meds ordered this encounter  Medications  . risperiDONE (RISPERDAL) 0.25 MG tablet    Sig: Take 1 tablet by mouth at bedtime.     Follow-up: No Follow-up on file.  [name], MD

## 2015-07-10 NOTE — Assessment & Plan Note (Signed)
2017 - mod to severe

## 2015-07-10 NOTE — Assessment & Plan Note (Signed)
Using depends

## 2015-07-10 NOTE — Assessment & Plan Note (Signed)
Poss relapse in 2016

## 2015-07-10 NOTE — Assessment & Plan Note (Signed)
2/16 severe - poss new CVA In a w/c

## 2015-07-10 NOTE — Assessment & Plan Note (Signed)
Many near-falls lately

## 2015-07-10 NOTE — Progress Notes (Signed)
Pre visit review using our clinic review tool, if applicable. No additional management support is needed unless otherwise documented below in the visit note. 

## 2015-07-12 ENCOUNTER — Emergency Department (HOSPITAL_COMMUNITY): Payer: Medicare Other

## 2015-07-12 ENCOUNTER — Encounter (HOSPITAL_COMMUNITY): Payer: Self-pay | Admitting: *Deleted

## 2015-07-12 ENCOUNTER — Inpatient Hospital Stay (HOSPITAL_COMMUNITY)
Admission: EM | Admit: 2015-07-12 | Discharge: 2015-07-18 | DRG: 871 | Disposition: A | Discharge status: Hospice | Payer: Medicare Other | Attending: Internal Medicine | Admitting: Internal Medicine

## 2015-07-12 DIAGNOSIS — N179 Acute kidney failure, unspecified: Secondary | ICD-10-CM | POA: Diagnosis present

## 2015-07-12 DIAGNOSIS — I739 Peripheral vascular disease, unspecified: Secondary | ICD-10-CM | POA: Diagnosis not present

## 2015-07-12 DIAGNOSIS — F0390 Unspecified dementia without behavioral disturbance: Secondary | ICD-10-CM | POA: Diagnosis present

## 2015-07-12 DIAGNOSIS — Z888 Allergy status to other drugs, medicaments and biological substances status: Secondary | ICD-10-CM | POA: Diagnosis not present

## 2015-07-12 DIAGNOSIS — Z66 Do not resuscitate: Secondary | ICD-10-CM | POA: Diagnosis present

## 2015-07-12 DIAGNOSIS — J189 Pneumonia, unspecified organism: Secondary | ICD-10-CM | POA: Diagnosis present

## 2015-07-12 DIAGNOSIS — I10 Essential (primary) hypertension: Secondary | ICD-10-CM | POA: Diagnosis present

## 2015-07-12 DIAGNOSIS — E43 Unspecified severe protein-calorie malnutrition: Secondary | ICD-10-CM | POA: Diagnosis present

## 2015-07-12 DIAGNOSIS — R4182 Altered mental status, unspecified: Secondary | ICD-10-CM | POA: Diagnosis present

## 2015-07-12 DIAGNOSIS — I639 Cerebral infarction, unspecified: Secondary | ICD-10-CM | POA: Diagnosis present

## 2015-07-12 DIAGNOSIS — N183 Chronic kidney disease, stage 3 unspecified: Secondary | ICD-10-CM | POA: Diagnosis present

## 2015-07-12 DIAGNOSIS — Z800 Family history of malignant neoplasm of digestive organs: Secondary | ICD-10-CM

## 2015-07-12 DIAGNOSIS — Z8546 Personal history of malignant neoplasm of prostate: Secondary | ICD-10-CM | POA: Diagnosis not present

## 2015-07-12 DIAGNOSIS — M542 Cervicalgia: Secondary | ICD-10-CM | POA: Diagnosis not present

## 2015-07-12 DIAGNOSIS — M545 Low back pain, unspecified: Secondary | ICD-10-CM | POA: Diagnosis present

## 2015-07-12 DIAGNOSIS — R64 Cachexia: Secondary | ICD-10-CM | POA: Diagnosis not present

## 2015-07-12 DIAGNOSIS — R402142 Coma scale, eyes open, spontaneous, at arrival to emergency department: Secondary | ICD-10-CM | POA: Diagnosis present

## 2015-07-12 DIAGNOSIS — G629 Polyneuropathy, unspecified: Secondary | ICD-10-CM | POA: Diagnosis present

## 2015-07-12 DIAGNOSIS — Z23 Encounter for immunization: Secondary | ICD-10-CM | POA: Diagnosis not present

## 2015-07-12 DIAGNOSIS — I129 Hypertensive chronic kidney disease with stage 1 through stage 4 chronic kidney disease, or unspecified chronic kidney disease: Secondary | ICD-10-CM | POA: Diagnosis present

## 2015-07-12 DIAGNOSIS — E870 Hyperosmolality and hypernatremia: Secondary | ICD-10-CM | POA: Diagnosis not present

## 2015-07-12 DIAGNOSIS — Z515 Encounter for palliative care: Secondary | ICD-10-CM | POA: Insufficient documentation

## 2015-07-12 DIAGNOSIS — Z87891 Personal history of nicotine dependence: Secondary | ICD-10-CM | POA: Diagnosis not present

## 2015-07-12 DIAGNOSIS — G9340 Encephalopathy, unspecified: Secondary | ICD-10-CM | POA: Diagnosis not present

## 2015-07-12 DIAGNOSIS — Z886 Allergy status to analgesic agent status: Secondary | ICD-10-CM

## 2015-07-12 DIAGNOSIS — A419 Sepsis, unspecified organism: Principal | ICD-10-CM | POA: Diagnosis present

## 2015-07-12 DIAGNOSIS — D649 Anemia, unspecified: Secondary | ICD-10-CM | POA: Diagnosis present

## 2015-07-12 DIAGNOSIS — G8929 Other chronic pain: Secondary | ICD-10-CM | POA: Diagnosis present

## 2015-07-12 DIAGNOSIS — R402212 Coma scale, best verbal response, none, at arrival to emergency department: Secondary | ICD-10-CM | POA: Diagnosis present

## 2015-07-12 DIAGNOSIS — E860 Dehydration: Secondary | ICD-10-CM | POA: Diagnosis present

## 2015-07-12 DIAGNOSIS — R402352 Coma scale, best motor response, localizes pain, at arrival to emergency department: Secondary | ICD-10-CM | POA: Diagnosis not present

## 2015-07-12 DIAGNOSIS — D509 Iron deficiency anemia, unspecified: Secondary | ICD-10-CM | POA: Diagnosis present

## 2015-07-12 DIAGNOSIS — K219 Gastro-esophageal reflux disease without esophagitis: Secondary | ICD-10-CM | POA: Diagnosis not present

## 2015-07-12 DIAGNOSIS — G9341 Metabolic encephalopathy: Secondary | ICD-10-CM | POA: Diagnosis present

## 2015-07-12 DIAGNOSIS — Z8673 Personal history of transient ischemic attack (TIA), and cerebral infarction without residual deficits: Secondary | ICD-10-CM

## 2015-07-12 DIAGNOSIS — G5793 Unspecified mononeuropathy of bilateral lower limbs: Secondary | ICD-10-CM | POA: Diagnosis present

## 2015-07-12 DIAGNOSIS — E876 Hypokalemia: Secondary | ICD-10-CM | POA: Diagnosis not present

## 2015-07-12 DIAGNOSIS — J440 Chronic obstructive pulmonary disease with acute lower respiratory infection: Secondary | ICD-10-CM | POA: Diagnosis present

## 2015-07-12 DIAGNOSIS — Z6822 Body mass index (BMI) 22.0-22.9, adult: Secondary | ICD-10-CM

## 2015-07-12 DIAGNOSIS — E878 Other disorders of electrolyte and fluid balance, not elsewhere classified: Secondary | ICD-10-CM | POA: Diagnosis present

## 2015-07-12 DIAGNOSIS — F1010 Alcohol abuse, uncomplicated: Secondary | ICD-10-CM | POA: Diagnosis not present

## 2015-07-12 DIAGNOSIS — Y95 Nosocomial condition: Secondary | ICD-10-CM | POA: Diagnosis not present

## 2015-07-12 DIAGNOSIS — Z923 Personal history of irradiation: Secondary | ICD-10-CM

## 2015-07-12 DIAGNOSIS — Z8249 Family history of ischemic heart disease and other diseases of the circulatory system: Secondary | ICD-10-CM

## 2015-07-12 DIAGNOSIS — J449 Chronic obstructive pulmonary disease, unspecified: Secondary | ICD-10-CM | POA: Diagnosis present

## 2015-07-12 DIAGNOSIS — Z789 Other specified health status: Secondary | ICD-10-CM | POA: Diagnosis not present

## 2015-07-12 HISTORY — DX: Chronic kidney disease, stage 3 (moderate): N18.3

## 2015-07-12 HISTORY — DX: Chronic kidney disease, stage 3 unspecified: N18.30

## 2015-07-12 LAB — I-STAT VENOUS BLOOD GAS, ED
Acid-base deficit: 3 mmol/L — ABNORMAL HIGH (ref 0.0–2.0)
BICARBONATE: 20.6 meq/L (ref 20.0–24.0)
O2 SAT: 98 %
PCO2 VEN: 31.7 mmHg — AB (ref 45.0–50.0)
PH VEN: 7.421 — AB (ref 7.250–7.300)
TCO2: 22 mmol/L (ref 0–100)
pO2, Ven: 109 mmHg — ABNORMAL HIGH (ref 30.0–45.0)

## 2015-07-12 LAB — COMPREHENSIVE METABOLIC PANEL
ALT: 34 U/L (ref 17–63)
ANION GAP: 11 (ref 5–15)
AST: 36 U/L (ref 15–41)
Albumin: 2.9 g/dL — ABNORMAL LOW (ref 3.5–5.0)
Alkaline Phosphatase: 124 U/L (ref 38–126)
BILIRUBIN TOTAL: 0.6 mg/dL (ref 0.3–1.2)
BUN: 51 mg/dL — ABNORMAL HIGH (ref 6–20)
CHLORIDE: 110 mmol/L (ref 101–111)
CO2: 22 mmol/L (ref 22–32)
Calcium: 9.4 mg/dL (ref 8.9–10.3)
Creatinine, Ser: 1.46 mg/dL — ABNORMAL HIGH (ref 0.61–1.24)
GFR, EST AFRICAN AMERICAN: 48 mL/min — AB (ref >60)
GFR, EST NON AFRICAN AMERICAN: 42 mL/min — AB (ref >60)
Glucose, Bld: 147 mg/dL — ABNORMAL HIGH (ref 65–99)
POTASSIUM: 4.1 mmol/L (ref 3.5–5.1)
Sodium: 143 mmol/L (ref 135–145)
TOTAL PROTEIN: 6 g/dL — AB (ref 6.5–8.1)

## 2015-07-12 LAB — CBC WITH DIFFERENTIAL/PLATELET
BASOS ABS: 0 10*3/uL (ref 0.0–0.1)
BASOS PCT: 0 %
EOS ABS: 0 10*3/uL (ref 0.0–0.7)
Eosinophils Relative: 0 %
HCT: 26 % — ABNORMAL LOW (ref 39.0–52.0)
Hemoglobin: 8.5 g/dL — ABNORMAL LOW (ref 13.0–17.0)
Lymphocytes Relative: 3 %
Lymphs Abs: 0.4 10*3/uL — ABNORMAL LOW (ref 0.7–4.0)
MCH: 30.8 pg (ref 26.0–34.0)
MCHC: 32.7 g/dL (ref 30.0–36.0)
MCV: 94.2 fL (ref 78.0–100.0)
MONO ABS: 0.8 10*3/uL (ref 0.1–1.0)
MONOS PCT: 5 %
NEUTROS PCT: 92 %
Neutro Abs: 14.6 10*3/uL — ABNORMAL HIGH (ref 1.7–7.7)
PLATELETS: 248 10*3/uL (ref 150–400)
RBC: 2.76 MIL/uL — ABNORMAL LOW (ref 4.22–5.81)
RDW: 13.9 % (ref 11.5–15.5)
WBC: 15.8 10*3/uL — AB (ref 4.0–10.5)

## 2015-07-12 LAB — URINALYSIS, ROUTINE W REFLEX MICROSCOPIC
Bilirubin Urine: NEGATIVE
Glucose, UA: NEGATIVE mg/dL
HGB URINE DIPSTICK: NEGATIVE
Ketones, ur: NEGATIVE mg/dL
NITRITE: NEGATIVE
PROTEIN: NEGATIVE mg/dL
Specific Gravity, Urine: 1.02 (ref 1.005–1.030)
pH: 5 (ref 5.0–8.0)

## 2015-07-12 LAB — I-STAT CHEM 8, ED
BUN: 42 mg/dL — AB (ref 6–20)
CHLORIDE: 112 mmol/L — AB (ref 101–111)
CREATININE: 1.3 mg/dL — AB (ref 0.61–1.24)
Calcium, Ion: 1.18 mmol/L (ref 1.13–1.30)
Glucose, Bld: 158 mg/dL — ABNORMAL HIGH (ref 65–99)
HEMATOCRIT: 22 % — AB (ref 39.0–52.0)
Hemoglobin: 7.5 g/dL — ABNORMAL LOW (ref 13.0–17.0)
Potassium: 3.7 mmol/L (ref 3.5–5.1)
Sodium: 144 mmol/L (ref 135–145)
TCO2: 21 mmol/L (ref 0–100)

## 2015-07-12 LAB — I-STAT CG4 LACTIC ACID, ED
LACTIC ACID, VENOUS: 0.51 mmol/L (ref 0.5–2.0)
Lactic Acid, Venous: 1.07 mmol/L (ref 0.5–2.0)

## 2015-07-12 LAB — URINE MICROSCOPIC-ADD ON
BACTERIA UA: NONE SEEN
Squamous Epithelial / LPF: NONE SEEN

## 2015-07-12 LAB — LIPASE, BLOOD: Lipase: 42 U/L (ref 11–51)

## 2015-07-12 LAB — PROCALCITONIN: Procalcitonin: <0.1 ng/mL

## 2015-07-12 LAB — CBG MONITORING, ED: Glucose-Capillary: 145 mg/dL — ABNORMAL HIGH (ref 65–99)

## 2015-07-12 LAB — I-STAT TROPONIN, ED: TROPONIN I, POC: 0 ng/mL (ref 0.00–0.08)

## 2015-07-12 MED ORDER — ASPIRIN 300 MG RE SUPP
150.0000 mg | Freq: Every day | RECTAL | Status: DC
  Administered 2015-07-14 – 2015-07-17 (×3): 150 mg via RECTAL
  Filled 2015-07-12 (×3): qty 1

## 2015-07-12 MED ORDER — SODIUM CHLORIDE 0.9 % IV BOLUS (SEPSIS)
1000.0000 mL | Freq: Once | INTRAVENOUS | Status: AC
  Administered 2015-07-13: 1000 mL via INTRAVENOUS

## 2015-07-12 MED ORDER — SODIUM CHLORIDE 0.9 % IV BOLUS (SEPSIS)
1000.0000 mL | INTRAVENOUS | Status: AC
  Administered 2015-07-12: 1000 mL via INTRAVENOUS

## 2015-07-12 MED ORDER — VANCOMYCIN HCL 500 MG IV SOLR
500.0000 mg | Freq: Two times a day (BID) | INTRAVENOUS | Status: DC
  Administered 2015-07-13: 500 mg via INTRAVENOUS
  Filled 2015-07-12 (×3): qty 500

## 2015-07-12 MED ORDER — SODIUM CHLORIDE 0.9 % IV SOLN
Freq: Once | INTRAVENOUS | Status: AC
  Administered 2015-07-12: 23:00:00 via INTRAVENOUS

## 2015-07-12 MED ORDER — PIPERACILLIN-TAZOBACTAM 3.375 G IVPB 30 MIN
3.3750 g | Freq: Once | INTRAVENOUS | Status: AC
  Administered 2015-07-12: 3.375 g via INTRAVENOUS
  Filled 2015-07-12: qty 50

## 2015-07-12 MED ORDER — SODIUM CHLORIDE 0.9 % IV SOLN
Freq: Once | INTRAVENOUS | Status: AC
  Administered 2015-07-13: via INTRAVENOUS

## 2015-07-12 MED ORDER — PIPERACILLIN-TAZOBACTAM 3.375 G IVPB
3.3750 g | Freq: Three times a day (TID) | INTRAVENOUS | Status: DC
  Administered 2015-07-13 – 2015-07-17 (×14): 3.375 g via INTRAVENOUS
  Filled 2015-07-12 (×18): qty 50

## 2015-07-12 MED ORDER — VANCOMYCIN HCL IN DEXTROSE 1-5 GM/200ML-% IV SOLN
1000.0000 mg | Freq: Once | INTRAVENOUS | Status: AC
  Administered 2015-07-12: 1000 mg via INTRAVENOUS
  Filled 2015-07-12: qty 200

## 2015-07-12 MED ORDER — NOREPINEPHRINE BITARTRATE 1 MG/ML IV SOLN
0.0000 ug/min | Freq: Once | INTRAVENOUS | Status: DC
  Filled 2015-07-12: qty 4

## 2015-07-12 NOTE — ED Notes (Signed)
Neighbors who care for pt called EMS b/c for the past few days he has been more lethargic and hadn't eaten anything x 2 days.  Recently discharged home from a rehab center for frequent falls.  When EMS arrived pt was unresponsive. VS hr 46, 110/68, rr 22, O2 88%RA, cbg 132.  Pt placed on NRB which improved o2 sats to 100%.  Once pt was oxygenated he was able to mumble and opened his eyes and tracked paramedic.

## 2015-07-12 NOTE — ED Notes (Signed)
Dr Niu at the bedside. 

## 2015-07-12 NOTE — ED Provider Notes (Signed)
I saw and evaluated the patient, reviewed the resident's note and I agree with the findings and plan.   EKG Interpretation   Date/Time:  Friday July 12 2015 16:48:49 EST Ventricular Rate:  166 PR Interval:    QRS Duration: 181 QT Interval:    QTC Calculation:   R Axis:   142 Text Interpretation:  Extreme tachycardia with wide complex, no further  rhythm analysis attempted Artifact in lead(s) I II III aVR aVL aVF V1 V2  V3 V4 V5 V6 Interpretation limited secondary to artifact Confirmed by  Que Meneely  MD, Arlean Thies [id number]) on 07/12/2015 8:35:07 PM      Results for orders placed or performed during the hospital encounter of 07/12/15  Comprehensive metabolic panel  Result Value Ref Range   Sodium 143 135 - 145 mmol/L   Potassium 4.1 3.5 - 5.1 mmol/L   Chloride 110 101 - 111 mmol/L   CO2 22 22 - 32 mmol/L   Glucose, Bld 147 (H) 65 - 99 mg/dL   BUN 51 (H) 6 - 20 mg/dL   Creatinine, Ser 1.46 (H) 0.61 - 1.24 mg/dL   Calcium 9.4 8.9 - 10.3 mg/dL   Total Protein 6.0 (L) 6.5 - 8.1 g/dL   Albumin 2.9 (L) 3.5 - 5.0 g/dL   AST 36 15 - 41 U/L   ALT 34 17 - 63 U/L   Alkaline Phosphatase 124 38 - 126 U/L   Total Bilirubin 0.6 0.3 - 1.2 mg/dL   GFR calc non Af Amer 42 (L) >60 mL/min   GFR calc Af Amer 48 (L) >60 mL/min   Anion gap 11 5 - 15  Urinalysis, Routine w reflex microscopic (not at [facility])  Result Value Ref Range   Color, Urine YELLOW YELLOW   APPearance CLOUDY (A) CLEAR   Specific Gravity, Urine 1.020 1.005 - 1.030   pH 5.0 5.0 - 8.0   Glucose, UA NEGATIVE NEGATIVE mg/dL   Hgb urine dipstick NEGATIVE NEGATIVE   Bilirubin Urine NEGATIVE NEGATIVE   Ketones, ur NEGATIVE NEGATIVE mg/dL   Protein, ur NEGATIVE NEGATIVE mg/dL   Nitrite NEGATIVE NEGATIVE   Leukocytes, UA SMALL (A) NEGATIVE  CBC with Differential  Result Value Ref Range   WBC 15.8 (H) 4.0 - 10.5 K/uL   RBC 2.76 (L) 4.22 - 5.81 MIL/uL   Hemoglobin 8.5 (L) 13.0 - 17.0 g/dL   HCT 26.0 (L) 39.0 - 52.0 %   MCV 94.2  78.0 - 100.0 fL   MCH 30.8 26.0 - 34.0 pg   MCHC 32.7 30.0 - 36.0 g/dL   RDW 13.9 11.5 - 15.5 %   Platelets 248 150 - 400 K/uL   Neutrophils Relative % 92 %   Neutro Abs 14.6 (H) 1.7 - 7.7 K/uL   Lymphocytes Relative 3 %   Lymphs Abs 0.4 (L) 0.7 - 4.0 K/uL   Monocytes Relative 5 %   Monocytes Absolute 0.8 0.1 - 1.0 K/uL   Eosinophils Relative 0 %   Eosinophils Absolute 0.0 0.0 - 0.7 K/uL   Basophils Relative 0 %   Basophils Absolute 0.0 0.0 - 0.1 K/uL  Lipase, blood  Result Value Ref Range   Lipase 42 11 - 51 U/L  Procalcitonin  Result Value Ref Range   Procalcitonin <0.10 ng/mL  Urine microscopic-add on  Result Value Ref Range   Squamous Epithelial / LPF NONE SEEN NONE SEEN   WBC, UA 0-5 0 - 5 WBC/hpf   RBC / HPF 0-5 0 - 5 RBC/hpf  Bacteria, UA NONE SEEN NONE SEEN  I-Stat CG4 Lactic Acid, ED  (not at [facility])  Result Value Ref Range   Lactic Acid, Venous 0.51 0.5 - 2.0 mmol/L  I-stat troponin, ED (not at [facility])  Result Value Ref Range   Troponin i, poc 0.00 0.00 - 0.08 ng/mL   Comment 3          I-stat Chem 8, ED  Result Value Ref Range   Sodium 144 135 - 145 mmol/L   Potassium 3.7 3.5 - 5.1 mmol/L   Chloride 112 (H) 101 - 111 mmol/L   BUN 42 (H) 6 - 20 mg/dL   Creatinine, Ser 1.30 (H) 0.61 - 1.24 mg/dL   Glucose, Bld 158 (H) 65 - 99 mg/dL   Calcium, Ion 1.18 1.13 - 1.30 mmol/L   TCO2 21 0 - 100 mmol/L   Hemoglobin 7.5 (L) 13.0 - 17.0 g/dL   HCT 22.0 (L) 39.0 - 52.0 %  POC CBG, ED  Result Value Ref Range   Glucose-Capillary 145 (H) 65 - 99 mg/dL  I-Stat venous blood gas, ED  Result Value Ref Range   pH, Ven 7.421 (H) 7.250 - 7.300   pCO2, Ven 31.7 (L) 45.0 - 50.0 mmHg   pO2, Ven 109.0 (H) 30.0 - 45.0 mmHg   Bicarbonate 20.6 20.0 - 24.0 mEq/L   TCO2 22 0 - 100 mmol/L   O2 Saturation 98.0 %   Acid-base deficit 3.0 (H) 0.0 - 2.0 mmol/L   Patient temperature HIDE    Sample type VENOUS   I-Stat CG4 Lactic Acid, ED  (not at [facility])  Result Value Ref Range    Lactic Acid, Venous 1.07 0.5 - 2.0 mmol/L   Dg Chest 2 View  07/10/2015  CLINICAL DATA:  Cough, congestion and chest pain for 3 weeks EXAM: CHEST  2 VIEW COMPARISON:  April 23, 2015 FINDINGS: The heart size and mediastinal contours are stable. The heart size is enlarged. The aorta is tortuous. There is mild atelectasis of left lung base. There is mild increased pulmonary interstitium bilaterally. There is no pulmonary edema or pleural effusion. The visualized skeletal structures are unremarkable. IMPRESSION: Mild enlarged heart with mild increased pulmonary interstitium bilaterally consistent with mild congestive heart failure. Mild atelectasis of left lung base. Electronically Signed   By: Abelardo Diesel M.D.   On: 07/10/2015 12:49   Ct Head Wo Contrast  07/12/2015  CLINICAL DATA:  Lethargy/altered mental status. History of prostate carcinoma. EXAM: CT HEAD WITHOUT CONTRAST TECHNIQUE: Contiguous axial images were obtained from the base of the skull through the vertex without intravenous contrast. COMPARISON:  April 23, 2015 FINDINGS: There is mild diffuse atrophy, stable. There is no intracranial mass, hemorrhage, extra-axial fluid collection, or midline shift there is patchy small vessel disease in the centra semiovale bilaterally. There are prior small lacunar infarcts in the superior medial left basal ganglia. There is small vessel disease in each thalamus. There is also small vessel disease throughout much of the right external capsule, stable. No acute appearing infarct is evident. Bony calvarium appears intact. The mastoid air cells are clear. There are air-fluid levels in each maxillary antrum. No intraorbital lesions are apparent. IMPRESSION: Atrophy with small vessel disease and prior lacunar type infarcts as noted above. There is no intracranial mass, hemorrhage, or evidence of acute infarct. There are air-fluid levels in each maxillary antrum consistent with acute sinusitis bilaterally.  Electronically Signed   By: Lowella Grip III M.D.   On: 07/12/2015  17:41   Dg Chest Port 1 View  07/12/2015  CLINICAL DATA:  Lethargy and low O2 sats for 1 day. EXAM: PORTABLE CHEST 1 VIEW COMPARISON:  Chest x-rays dated 07/10/2015 and 04/23/2015. Comparison also made to chest CT dated 04/23/2015. FINDINGS: Today's study is somewhat limited by patient positioning and motion artifact. There appears to be a dense opacity at the left lung base which is new, suspicious for developing pneumonia. Right lung remains grossly clear throughout. Cardiomediastinal silhouette is stable in size and configuration. IMPRESSION: New dense opacity at the left lung base which is highly suspicious for a developing pneumonia. Study limitations detailed above. Electronically Signed   By: Franki Cabot M.D.   On: 07/12/2015 18:29    Patient seen by me. Will be admitted by the hospitalist service. Patient is a DO NOT RESUSCITATE. Patient with evidence of a new pneumonia. Patient recently discharged from rehabilitation center. Sent here from home. Patient with significant hypothermic with temp down as low as 32.2. Patient also with the tachypnea lowest blood pressure was 95 systolic. Patient never truly hypo-intensive. But showing evidence of sepsis despite lactic acid not being elevated. Patient did receive initial sepsis workup and antibiotics. Patient was warmed with warming blanket and temperature is now up to 35.9C.   Patient with a baseline altered mental status without significant change. As stated patient is a DO NOT RESUSCITATE.   CRITICAL CARE Performed by: Fredia Sorrow Total critical care time: 30 minutes Critical care time was exclusive of separately billable procedures and treating other patients. Critical care was necessary to treat or prevent imminent or life-threatening deterioration. Critical care was time spent personally by me on the following activities: development of treatment plan with  patient and/or surrogate as well as nursing, discussions with consultants, evaluation of patient's response to treatment, examination of patient, obtaining history from patient or surrogate, ordering and performing treatments and interventions, ordering and review of laboratory studies, ordering and review of radiographic studies, pulse oximetry and re-evaluation of patient's condition.   Fredia Sorrow, MD 07/12/15 [phone]

## 2015-07-12 NOTE — Consult Note (Addendum)
Pharmacy Antibiotic Note  Raymond Medina is a 82 y.o. male admitted on 07/12/2015 with sepsis.  Pharmacy has been consulted for vanc/zosyn dosing. Pt is hypothermic and bradycardic.  Plan: Vanc 100 mg IV x1, then 500 mg IV q12h Zosyn 3.375 g IV q8h Monitor renal function, cultures, LOT, clinical progression    Temp (24hrs), Avg:90 F (32.2 C), Min:90 F (32.2 C), Max:90 F (32.2 C)   Recent Labs Lab 07/10/15 1153  WBC 11.7*  CREATININE 1.08    Estimated Creatinine Clearance: 40.6 mL/min (by C-G formula based on Cr of 1.08).    Allergies  Allergen Reactions  . Budesonide-Formoterol Fumarate     REACTION: dizzy  . Hydrocodone-Acetaminophen     REACTION: prostate/ constipation  . Omeprazole     dizzy  . Plavix [Clopidogrel Bisulfate]     rash  . Ranitidine Hcl     REACTION: abd pain    Antimicrobials this admission: Vanc 2/10 >>  Zosyn 2/10 >>   Dose adjustments this admission: n/a  Microbiology results: 2/10 BCx:  2/10 UCx:    Thank you for allowing pharmacy to be a part of this patient's care.  Joya San, PharmD Clinical Pharmacy Resident Pager # [phone] 07/12/2015 5:54 PM    Pharmacy Code Sepsis Protocol  Time of code sepsis page: 16:49 [x]  Antibiotics delivered at 17:23  Were antibiotics ordered at the time of the code sepsis page? No Was it required to contact the physician? [x]  Physician not contacted []  Physician contacted to order antibiotics for code sepsis []  Physician contacted to recommend changing antibiotics  Pharmacy consulted for: vanc/zosyn  Anti-infectives    None        Nurse education provided: [x]  Minutes left to administer antibiotics to achieve 1 hour goal [x]  Correct order of antibiotic administration [x]  Antibiotic Y-site compatibilities     Roma Schanz, PharmD 07/12/2015, 4:51 PM

## 2015-07-12 NOTE — ED Notes (Signed)
Pt's neighbors at bedside.  Pt responding to neighbors and tracking them with his eyes.

## 2015-07-12 NOTE — ED Notes (Signed)
Lab at the bedside 

## 2015-07-12 NOTE — H&P (Signed)
[facility] History and Physical  [name] [id number] DOB: 1926-06-07 DOA: 07/12/2015  Referring physician: ED physician PCP: [name], MD  Specialists:   Chief Complaint: unrespoonsiveness and generalized weakness  HPI: Raymond Medina is a 82 y.o. male with PMH of hypertension, COPD, GERD, PVD, chronic neck pain, chronic back pain, prostate cancer (SS/P prostate radiation seeds 2000), CVA, chronic anemia, chronic kidney disease-stage III, alcohol abuse, who presents with unrespoonsiveness and generalized weakness.  Patient has AMS and is unresponsive, is unable to provide any medical history, therefore, history is obtained by discussing the case with ED physician, per EMS report, and with the nursing staff. The history is very limited.   It seems that pt was recently discharged home from rehab on 07/08/15 (Monday). He has home health nurse, who found that pt has have an increased weakness, lethargy, and decreased oral intake for several days. Today pt become unresponsive. When EMS arrived pt was unresponsive, with HR  46, bp 110/68, rr 22, O2 88% RAa and cbg 132. When I saw pt in ED, he has some cough. He is unresponsive, but mumbled. He seems moves all extremeties.  In ED, patient was found to have soft blood pressure 95/47, hypothermia with temperature 90--> 95.9, lactate 0.51, negative troponin, urinalysis with small moderate leukocytes, WBC 15.8, worsening renal function, negative CT-head for acute intracranial abnormalities. Chest x-Rail showed left basalar infiltration. Patient admitted to inpatient for further interventional treatment.  EKG: Independently reviewed. bradycardia, poor quality, we'll repeat EKG.  Where does patient live?   At home Can patient participate in ADLs?  None  Review of Systems:  could not be reviewed since patient is unresponsive.  Allergy:  Allergies  Allergen Reactions  . Budesonide-Formoterol Fumarate     REACTION: dizzy  .  Hydrocodone-Acetaminophen     REACTION: prostate/ constipation  . Omeprazole     dizzy  . Plavix [Clopidogrel Bisulfate]     rash  . Ranitidine Hcl     REACTION: abd pain    Past Medical History  Diagnosis Date  . Hypertension   . Low back pain   . Osteoarthritis   . Peripheral vascular disease ([place])   . Gout   . Giant cell arteritis ([place])   . COPD (chronic obstructive pulmonary disease) ([place])   . Allergic rhinitis   . GERD (gastroesophageal reflux disease)   . Tubulovillous adenoma polyp of colon     08/2001  . Hemorrhoids   . Radiation proctitis   . BPH (benign prostatic hyperplasia)   . Diverticulosis   . INSOMNIA, CHRONIC 06/14/2009  . HYPERTENSION 08/24/2007  . PERIPHERAL VASCULAR DISEASE 08/24/2007  . BRONCHITIS, ACUTE 08/03/2008  . ALLERGIC RHINITIS 09/05/2008  . COPD 08/08/2008  . GERD 12/07/2008  . CONSTIPATION 10/31/2009  . Actinic keratosis 06/04/2008  . OSTEOARTHRITIS 08/24/2007  . NECK PAIN 08/24/2007  . LOW BACK PAIN 08/24/2007  . WEIGHT LOSS 09/17/2009  . IMPAIRED GLUCOSE TOLERANCE 08/03/2008  . Contusion of forearm 09/17/2008  . 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 TOBACCO USE, QUIT 03/14/2009  . PROSTATE CANCER, HX OF 2000  . AVM (arteriovenous malformation)   . CVA (cerebral infarction) 09-09-10  . Prostate cancer ([place])   . CKD (chronic kidney disease), stage III     Past Surgical History  Procedure Laterality Date  . Insertion prostate radiation seed      2000  . Cataract extraction, bilateral      Social History:  reports that he quit smoking  about 44 years ago. His smoking use included Cigarettes. He does not have any smokeless tobacco history on file. He reports that he drinks about 16.8 oz of alcohol per week. He reports that he does not use illicit drugs.  Family History:  Family History  Problem Relation Age of Onset  . Stomach cancer Mother   . Hypertension Other   . Diabetes Other     Nephew  . Colon cancer Neg Hx      Prior to Admission  medications   Medication Sig Start Date End Date Taking? Authorizing Provider  amLODipine-valsartan (EXFORGE) 5-160 MG tablet TAKE 1 TABLET BY MOUTH EVERY DAY 03/26/15  Yes Aleksei Plotnikov V, MD  aspirin EC 81 MG tablet Take 1 tablet (81 mg total) by mouth daily. 02/19/14  Yes Aleksei Plotnikov V, MD  b complex vitamins tablet Take 1 tablet by mouth daily. 10/19/13  Yes Aleksei Plotnikov V, MD  celecoxib (CELEBREX) 100 MG capsule Take 1 capsule (100 mg total) by mouth 2 (two) times daily. 03/26/15  Yes Robyn Haber, MD  Cholecalciferol (VITAMIN D3) 1000 UNITS CAPS Take by mouth daily.     Yes Historical Provider, MD  dextromethorphan-guaiFENesin (MUCINEX DM) 30-600 MG 12hr tablet Take 1 tablet by mouth every 12 (twelve) hours as needed for cough.   Yes Historical Provider, MD  feeding supplement, ENSURE ENLIVE, (ENSURE ENLIVE) LIQD Take 237 mLs by mouth 2 (two) times daily between meals. 04/26/15  Yes Albertine Patricia, MD  folic acid (FOLVITE) 1 MG tablet Take 1 tablet (1 mg total) by mouth daily. 04/26/15  Yes Albertine Patricia, MD  gabapentin (NEURONTIN) 100 MG capsule Take 100 mg by mouth at bedtime.   Yes Historical Provider, MD  Omega-3 Fatty Acids (FISH OIL) 1000 MG CPDR Take by mouth daily.     Yes Historical Provider, MD  polyethylene glycol powder (GLYCOLAX/MIRALAX) powder 1 SCOOP ONCE DAILY AS NEEDED FOR CONSTIPATION 05/20/11  Yes Aleksei Plotnikov V, MD  thiamine 100 MG tablet Take 1 tablet (100 mg total) by mouth daily. 04/26/15  Yes Albertine Patricia, MD  traZODone (DESYREL) 50 MG tablet Take 0.5-1 tablets (25-50 mg total) by mouth at bedtime as needed for sleep. 04/17/15  Yes Aleksei Plotnikov V, MD  triamcinolone cream (KENALOG) 0.5 % Apply topically 2 (two) times daily. 11/14/12  Yes Aleksei Plotnikov V, MD  ferrous sulfate 325 (65 FE) MG tablet Take 1 tablet (325 mg total) by mouth daily. Patient not taking: Reported on 07/12/2015 07/10/15   [name], MD  Incontinence  Supply Disposable (DEPEND OVERNIGHT BRIEFS MEDIUM) MISC Use bid 09/14/14   Lew Dawes V, MD  risperiDONE (RISPERDAL) 0.25 MG tablet Take 1 tablet (0.25 mg total) by mouth 2 (two) times daily. Patient taking differently: Take 0.25 mg by mouth at bedtime.  07/10/15   [name], MD    Physical Exam: Filed Vitals:   07/12/15 2245 07/12/15 2300 07/12/15 2315 07/12/15 2330  BP: 105/41 94/41 96/45  95/42  Pulse: 78 79 83 79  Temp: 95.5 F (35.3 C) 95.9 F (35.5 C) 96.3 F (35.7 C) 96.6 F (35.9 C)  TempSrc:      Resp: 28 26 29 28   SpO2: 94% 96% 97% 99%   General: Not in acute distress HEENT:       Eyes: PERRL, EOMI, no scleral icterus.       ENT: No discharge from the ears and nose, no pharynx injection, no tonsillar enlargement.  Neck: No JVD, no bruit, no mass felt. Heme: No neck lymph node enlargement. Cardiac: S1/S2, RRR, No murmurs, No gallops or rubs. Pulm: Has rhonchi bilaterally. No wheezing, rales or rubs. Abd: Soft, nondistended, no organomegaly, BS present. Ext: No pitting leg edema bilaterally. 2+DP/PT pulse bilaterally. Musculoskeletal: No joint deformities, No joint redness or warmth, no limitation of ROM in spin. Skin: No rashes.  Neuro: unresponsive, cranial nerves II-XII grossly intact, moves all extremities. Psych:  could not be reviewed. Labs on Admission:  Basic Metabolic Panel:  Recent Labs Lab 07/10/15 1153 07/12/15 1700 07/12/15 1834  NA 140 143 144  K 4.0 4.1 3.7  CL 108 110 112*  CO2 24 22  --   GLUCOSE 110* 147* 158*  BUN 32* 51* 42*  CREATININE 1.08 1.46* 1.30*  CALCIUM 9.3 9.4  --   MG 2.3  --   --    Liver Function Tests:  Recent Labs Lab 07/10/15 1153 07/12/15 1700  AST 37 36  ALT 27 34  ALKPHOS 140* 124  BILITOT 0.7 0.6  PROT 6.5 6.0*  ALBUMIN 3.5 2.9*    Recent Labs Lab 07/12/15 1700  LIPASE 42   No results for input(s): AMMONIA in the last 168 hours. CBC:  Recent Labs Lab 07/10/15 1153  07/12/15 1700 07/12/15 1834  WBC 11.7* 15.8*  --   NEUTROABS 9.6* 14.6*  --   HGB 9.1* 8.5* 7.5*  HCT 27.7* 26.0* 22.0*  MCV 96.8 94.2  --   PLT 283.0 248  --    Cardiac Enzymes: No results for input(s): CKTOTAL, CKMB, CKMBINDEX, TROPONINI in the last 168 hours.  BNP (last 3 results) No results for input(s): BNP in the last 8760 hours.  ProBNP (last 3 results) No results for input(s): PROBNP in the last 8760 hours.  CBG:  Recent Labs Lab 07/12/15 1824  GLUCAP 145*    Radiological Exams on Admission: Ct Head Wo Contrast  07/12/2015  CLINICAL DATA:  Lethargy/altered mental status. History of prostate carcinoma. EXAM: CT HEAD WITHOUT CONTRAST TECHNIQUE: Contiguous axial images were obtained from the base of the skull through the vertex without intravenous contrast. COMPARISON:  April 23, 2015 FINDINGS: There is mild diffuse atrophy, stable. There is no intracranial mass, hemorrhage, extra-axial fluid collection, or midline shift there is patchy small vessel disease in the centra semiovale bilaterally. There are prior small lacunar infarcts in the superior medial left basal ganglia. There is small vessel disease in each thalamus. There is also small vessel disease throughout much of the right external capsule, stable. No acute appearing infarct is evident. Bony calvarium appears intact. The mastoid air cells are clear. There are air-fluid levels in each maxillary antrum. No intraorbital lesions are apparent. IMPRESSION: Atrophy with small vessel disease and prior lacunar type infarcts as noted above. There is no intracranial mass, hemorrhage, or evidence of acute infarct. There are air-fluid levels in each maxillary antrum consistent with acute sinusitis bilaterally. Electronically Signed   By: Lowella Grip III M.D.   On: 07/12/2015 17:41   Dg Chest Port 1 View  07/12/2015  CLINICAL DATA:  Lethargy and low O2 sats for 1 day. EXAM: PORTABLE CHEST 1 VIEW COMPARISON:  Chest x-rays  dated 07/10/2015 and 04/23/2015. Comparison also made to chest CT dated 04/23/2015. FINDINGS: Today's study is somewhat limited by patient positioning and motion artifact. There appears to be a dense opacity at the left lung base which is new, suspicious for developing pneumonia. Right lung remains grossly clear throughout.  Cardiomediastinal silhouette is stable in size and configuration. IMPRESSION: New dense opacity at the left lung base which is highly suspicious for a developing pneumonia. Study limitations detailed above. Electronically Signed   By: Franki Cabot M.D.   On: 07/12/2015 18:29    Assessment/Plan Principal Problem:   HCAP (healthcare-associated pneumonia) Active Problems:   Essential hypertension   PERIPHERAL VASCULAR DISEASE   COPD (chronic obstructive pulmonary disease) ([facility])   GERD   CVA (cerebral infarction)   Anemia   Neuropathy of both feet ([facility])   LBP (low back pain)   Severe protein-calorie malnutrition ([facility])   Sepsis ([place])   Acute encephalopathy   Acute renal failure superimposed on stage 3 chronic kidney disease ([facility])   Alcohol abuse   Sepsis due to HCAP: Chest x-[place] showed left basilar pneumonia. His is septic with leukocytosis, hypothermia and soft blood pressure. - will admit to [facility] - IV Vancomycin and Zosyn - Urine legionella and S. pneumococcal antigen - Follow up blood culture x2, sputum culture plus Flu pcr - will get Procalcitonin and trend lactic acid level per sepsis protocol - IVF: 3L of NS bolus in ED, followed by 100 mL per hour of NS - Warm blanket for hypothermia  Acute encephalopathy and unresponsiveness: Likely due to sepsis and HCAP. CT head is negative for acute intracranial abnormalities. Since history is very limited, it is difficult to have clue for other etiologies. -Treat his sepsis and HCAP as above - hold all oral meds due to unresponsiveness - When necessary Zofran for nausea - Frequent neuro check  Essential hypertension:  now has soft bp due to sepsis -Hold home amlodipine  Severe protein-calorie malnutrition ([place]): -will start nutrition supplement when able to eat  Hx of stroke: -switched oral ASA to suppository aspirin  AoCKD-III: Baseline Cre is 1.08 on 07/10/15, his Cre 1.30 is on admission. Likely due to prerenal secondary to dehydration. - IVF as above - Check FeNa - Follow up renal function by BMP - Hold Celebrex  Iron deficiency anemia: Hemoglobin 9.1 on 07/10/15--> 8.5 today. -Hold iron supplement  Hx of Alcohol abuse: now is unresponsive -Hold off CIWA protocol  Goal of care: EDP, Dr. Leonette Monarch spoke with his care giver on the phone, confirmed that pt is DNR, and they do not want any invasive treatment. May need to discuss with family further in the morning.  DVT ppx:  Q Lovenox  Code Status: DNR Family Communication: None at bed side. Disposition Plan: Admit to inpatient   Date of Service 07/13/2015    Ivor Costa [facility] Pager [phone]  If 7PM-7AM, please contact night-coverage [email or web address] Password [facility] 07/13/2015, 12:17 AM

## 2015-07-12 NOTE — ED Notes (Signed)
Switched from NRB to nasal cannula at this time.

## 2015-07-12 NOTE — ED Notes (Signed)
bair hugger placed  For low temp

## 2015-07-12 NOTE — ED Provider Notes (Signed)
CSN: [id number]     Arrival date & time 07/12/15  1630 History   First MD Initiated Contact with Patient 07/12/15 1644     Chief Complaint  Patient presents with  . Altered Mental Status  . Bradycardia     (Consider location/radiation/quality/duration/timing/severity/associated sxs/prior Treatment) Patient is a 82 y.o. male presenting with altered mental status. The history is provided by a caregiver. The history is limited by the condition of the patient.  Altered Mental Status Presenting symptoms: confusion, lethargy and partial responsiveness   Severity:  Moderate Most recent episode:  2 days ago Progression:  Worsening Chronicity:  New Context comment:  History of stroke who was recently discharged from rehabilitation facility Associated symptoms comment:  Noted to be hypothermic   Past Medical History  Diagnosis Date  . Hypertension   . Low back pain   . Osteoarthritis   . Peripheral vascular disease (DISH)   . Gout   . Giant cell arteritis ([place])   . COPD (chronic obstructive pulmonary disease) ([place])   . Allergic rhinitis   . GERD (gastroesophageal reflux disease)   . Tubulovillous adenoma polyp of colon     08/2001  . Hemorrhoids   . Radiation proctitis   . BPH (benign prostatic hyperplasia)   . Diverticulosis   . INSOMNIA, CHRONIC 06/14/2009  . HYPERTENSION 08/24/2007  . PERIPHERAL VASCULAR DISEASE 08/24/2007  . BRONCHITIS, ACUTE 08/03/2008  . ALLERGIC RHINITIS 09/05/2008  . COPD 08/08/2008  . GERD 12/07/2008  . CONSTIPATION 10/31/2009  . Actinic keratosis 06/04/2008  . OSTEOARTHRITIS 08/24/2007  . NECK PAIN 08/24/2007  . LOW BACK PAIN 08/24/2007  . WEIGHT LOSS 09/17/2009  . IMPAIRED GLUCOSE TOLERANCE 08/03/2008  . Contusion of forearm 09/17/2008  . 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 TOBACCO USE, QUIT 03/14/2009  . PROSTATE CANCER, HX OF 2000  . AVM (arteriovenous malformation)   . CVA (cerebral infarction) 09-09-10  . Prostate cancer ([place])   . CKD (chronic kidney  disease), stage III    Past Surgical History  Procedure Laterality Date  . Insertion prostate radiation seed      2000  . Cataract extraction, bilateral     Family History  Problem Relation Age of Onset  . Stomach cancer Mother   . Hypertension Other   . Diabetes Other     Nephew  . Colon cancer Neg Hx    Social History  Substance Use Topics  . Smoking status: Former Smoker    Types: Cigarettes    Quit date: 09/02/1970  . Smokeless tobacco: None  . Alcohol Use: 16.8 oz/week    28 Cans of beer per week     Comment: 2-3 drinks daily ( no more than 4 beers/d)    Review of Systems  Unable to perform ROS: Mental status change  Psychiatric/Behavioral: Positive for confusion.      Allergies  Budesonide-formoterol fumarate; Hydrocodone-acetaminophen; Omeprazole; Plavix; and Ranitidine hcl  Home Medications   Prior to Admission medications   Medication Sig Start Date End Date Taking? Authorizing Provider  amLODipine-valsartan (EXFORGE) 5-160 MG tablet TAKE 1 TABLET BY MOUTH EVERY DAY 03/26/15  Yes Aleksei Plotnikov V, MD  aspirin EC 81 MG tablet Take 1 tablet (81 mg total) by mouth daily. 02/19/14  Yes Aleksei Plotnikov V, MD  b complex vitamins tablet Take 1 tablet by mouth daily. 10/19/13  Yes Aleksei Plotnikov V, MD  celecoxib (CELEBREX) 100 MG capsule Take 1 capsule (100 mg total) by mouth 2 (two) times daily. 03/26/15  Yes Robyn Haber, MD  Cholecalciferol (VITAMIN D3) 1000 UNITS CAPS Take by mouth daily.     Yes Historical Provider, MD  dextromethorphan-guaiFENesin (MUCINEX DM) 30-600 MG 12hr tablet Take 1 tablet by mouth every 12 (twelve) hours as needed for cough.   Yes Historical Provider, MD  feeding supplement, ENSURE ENLIVE, (ENSURE ENLIVE) LIQD Take 237 mLs by mouth 2 (two) times daily between meals. 04/26/15  Yes Albertine Patricia, MD  folic acid (FOLVITE) 1 MG tablet Take 1 tablet (1 mg total) by mouth daily. 04/26/15  Yes Albertine Patricia, MD  gabapentin  (NEURONTIN) 100 MG capsule Take 100 mg by mouth at bedtime.   Yes Historical Provider, MD  Omega-3 Fatty Acids (FISH OIL) 1000 MG CPDR Take by mouth daily.     Yes Historical Provider, MD  polyethylene glycol powder (GLYCOLAX/MIRALAX) powder 1 SCOOP ONCE DAILY AS NEEDED FOR CONSTIPATION 05/20/11  Yes Aleksei Plotnikov V, MD  thiamine 100 MG tablet Take 1 tablet (100 mg total) by mouth daily. 04/26/15  Yes Albertine Patricia, MD  traZODone (DESYREL) 50 MG tablet Take 0.5-1 tablets (25-50 mg total) by mouth at bedtime as needed for sleep. 04/17/15  Yes Aleksei Plotnikov V, MD  triamcinolone cream (KENALOG) 0.5 % Apply topically 2 (two) times daily. 11/14/12  Yes Aleksei Plotnikov V, MD  ferrous sulfate 325 (65 FE) MG tablet Take 1 tablet (325 mg total) by mouth daily. Patient not taking: Reported on 07/12/2015 07/10/15   [name], MD  Incontinence Supply Disposable (DEPEND OVERNIGHT BRIEFS MEDIUM) MISC Use bid 09/14/14   Lew Dawes V, MD  risperiDONE (RISPERDAL) 0.25 MG tablet Take 1 tablet (0.25 mg total) by mouth 2 (two) times daily. Patient taking differently: Take 0.25 mg by mouth at bedtime.  07/10/15   Aleksei Plotnikov V, MD   BP 91/52 mmHg  Pulse 83  Temp(Src) 97.7 F (36.5 C) (Core (Comment))  Resp 29  SpO2 96% Physical Exam  Constitutional: He appears cachectic. He has a sickly appearance. No distress. Face mask in place.  HENT:  Head: Normocephalic and atraumatic.  Right Ear: External ear normal.  Left Ear: External ear normal.  Eyes: Pupils are equal, round, and reactive to light. Right eye exhibits no discharge. Left eye exhibits no discharge. No scleral icterus.  Neck: Normal range of motion. Neck supple.  Cardiovascular: Normal rate.  Exam reveals no gallop and no friction rub.   No murmur heard. Pulmonary/Chest: Effort normal. No stridor. No respiratory distress. He has no wheezes. He has rales in the left middle field and the left lower field. He exhibits no  tenderness.  Abdominal: Soft. He exhibits no distension and no mass. There is no tenderness. There is no rebound and no guarding.  Musculoskeletal: He exhibits no edema or tenderness.  Neurological: GCS eye subscore is 4. GCS verbal subscore is 1. GCS motor subscore is 5.  Skin: Skin is warm and dry. No rash noted. He is not diaphoretic. No erythema.    ED Course  Procedures (including critical care time) Labs Review Labs Reviewed  COMPREHENSIVE METABOLIC PANEL - Abnormal; Notable for the following:    Glucose, Bld 147 (*)    BUN 51 (*)    Creatinine, Ser 1.46 (*)    Total Protein 6.0 (*)    Albumin 2.9 (*)    GFR calc non Af Amer 42 (*)    GFR calc Af Amer 48 (*)    All other components within normal limits  URINALYSIS,  ROUTINE W REFLEX MICROSCOPIC (NOT AT [facility]) - Abnormal; Notable for the following:    APPearance CLOUDY (*)    Leukocytes, UA SMALL (*)    All other components within normal limits  CBC WITH DIFFERENTIAL/PLATELET - Abnormal; Notable for the following:    WBC 15.8 (*)    RBC 2.76 (*)    Hemoglobin 8.5 (*)    HCT 26.0 (*)    Neutro Abs 14.6 (*)    Lymphs Abs 0.4 (*)    All other components within normal limits  I-STAT CHEM 8, ED - Abnormal; Notable for the following:    Chloride 112 (*)    BUN 42 (*)    Creatinine, Ser 1.30 (*)    Glucose, Bld 158 (*)    Hemoglobin 7.5 (*)    HCT 22.0 (*)    All other components within normal limits  CBG MONITORING, ED - Abnormal; Notable for the following:    Glucose-Capillary 145 (*)    All other components within normal limits  I-STAT VENOUS BLOOD GAS, ED - Abnormal; Notable for the following:    pH, Ven 7.421 (*)    pCO2, Ven 31.7 (*)    pO2, Ven 109.0 (*)    Acid-base deficit 3.0 (*)    All other components within normal limits  CULTURE, BLOOD (ROUTINE X 2)  CULTURE, BLOOD (ROUTINE X 2)  URINE CULTURE  CULTURE, EXPECTORATED SPUTUM-ASSESSMENT  GRAM STAIN  LIPASE, BLOOD  PROCALCITONIN  URINE MICROSCOPIC-ADD ON   INFLUENZA PANEL BY PCR (TYPE A & B, H1N1)  STREP PNEUMONIAE URINARY ANTIGEN  LEGIONELLA ANTIGEN, URINE  CREATININE, URINE, RANDOM  SODIUM, URINE, RANDOM  I-STAT CG4 LACTIC ACID, ED  I-STAT TROPOININ, ED  I-STAT CG4 LACTIC ACID, ED    Imaging Review Ct Head Wo Contrast  07/12/2015  CLINICAL DATA:  Lethargy/altered mental status. History of prostate carcinoma. EXAM: CT HEAD WITHOUT CONTRAST TECHNIQUE: Contiguous axial images were obtained from the base of the skull through the vertex without intravenous contrast. COMPARISON:  April 23, 2015 FINDINGS: There is mild diffuse atrophy, stable. There is no intracranial mass, hemorrhage, extra-axial fluid collection, or midline shift there is patchy small vessel disease in the centra semiovale bilaterally. There are prior small lacunar infarcts in the superior medial left basal ganglia. There is small vessel disease in each thalamus. There is also small vessel disease throughout much of the right external capsule, stable. No acute appearing infarct is evident. Bony calvarium appears intact. The mastoid air cells are clear. There are air-fluid levels in each maxillary antrum. No intraorbital lesions are apparent. IMPRESSION: Atrophy with small vessel disease and prior lacunar type infarcts as noted above. There is no intracranial mass, hemorrhage, or evidence of acute infarct. There are air-fluid levels in each maxillary antrum consistent with acute sinusitis bilaterally. Electronically Signed   By: Lowella Grip III M.D.   On: 07/12/2015 17:41   Dg Chest Port 1 View  07/12/2015  CLINICAL DATA:  Lethargy and low O2 sats for 1 day. EXAM: PORTABLE CHEST 1 VIEW COMPARISON:  Chest x-rays dated 07/10/2015 and 04/23/2015. Comparison also made to chest CT dated 04/23/2015. FINDINGS: Today's study is somewhat limited by patient positioning and motion artifact. There appears to be a dense opacity at the left lung base which is new, suspicious for developing  pneumonia. Right lung remains grossly clear throughout. Cardiomediastinal silhouette is stable in size and configuration. IMPRESSION: New dense opacity at the left lung base which is highly suspicious for a developing  pneumonia. Study limitations detailed above. Electronically Signed   By: Franki Cabot M.D.   On: 07/12/2015 18:29   I have personally reviewed and evaluated these images and lab results as part of my medical decision-making.   EKG Interpretation   Date/Time:  Friday July 12 2015 16:48:49 EST Ventricular Rate:  166 PR Interval:    QRS Duration: 181 QT Interval:    QTC Calculation:   R Axis:   142 Text Interpretation:  Extreme tachycardia with wide complex, no further  rhythm analysis attempted Artifact in lead(s) I II III aVR aVL aVF V1 V2  V3 V4 V5 V6 Interpretation limited secondary to artifact Confirmed by  ZACKOWSKI  MD, SCOTT ([id number]) on 07/12/2015 8:35:07 PM      MDM   Code sepsis initiated and patient noted to be hypothermic below 93. Bear hugger placed. Septic workup obtained patient given 30 mL per caked IV fluid. Workup revealed the source likely pneumonia. Patient given empiric IV antibiotics. Blood pressure around maps of 65. Given additional fluid bolus.   I spoke with the sister personally who confirmed patient's DO NOT RESUSCITATE status.  Patient will require admission to stepdown unit for further management.  Final diagnoses:  Altered mental status    Pt seen in conjunction with Dr. Rogene Houston.  [name], MD resident    [name], MD 07/13/15 [phone]

## 2015-07-13 ENCOUNTER — Encounter (HOSPITAL_COMMUNITY): Payer: Self-pay

## 2015-07-13 DIAGNOSIS — K219 Gastro-esophageal reflux disease without esophagitis: Secondary | ICD-10-CM

## 2015-07-13 DIAGNOSIS — D649 Anemia, unspecified: Secondary | ICD-10-CM

## 2015-07-13 LAB — MRSA PCR SCREENING: MRSA BY PCR: POSITIVE — AB

## 2015-07-13 LAB — BASIC METABOLIC PANEL
Anion gap: 12 (ref 5–15)
BUN: 45 mg/dL — ABNORMAL HIGH (ref 6–20)
CALCIUM: 8.2 mg/dL — AB (ref 8.9–10.3)
CO2: 16 mmol/L — AB (ref 22–32)
CREATININE: 1.49 mg/dL — AB (ref 0.61–1.24)
Chloride: 118 mmol/L — ABNORMAL HIGH (ref 101–111)
GFR, EST AFRICAN AMERICAN: 47 mL/min — AB (ref >60)
GFR, EST NON AFRICAN AMERICAN: 41 mL/min — AB (ref >60)
Glucose, Bld: 108 mg/dL — ABNORMAL HIGH (ref 65–99)
Potassium: 4 mmol/L (ref 3.5–5.1)
Sodium: 146 mmol/L — ABNORMAL HIGH (ref 135–145)

## 2015-07-13 LAB — CBC
HEMATOCRIT: 22.5 % — AB (ref 39.0–52.0)
Hemoglobin: 7.4 g/dL — ABNORMAL LOW (ref 13.0–17.0)
MCH: 31.4 pg (ref 26.0–34.0)
MCHC: 32.9 g/dL (ref 30.0–36.0)
MCV: 95.3 fL (ref 78.0–100.0)
PLATELETS: 232 10*3/uL (ref 150–400)
RBC: 2.36 MIL/uL — ABNORMAL LOW (ref 4.22–5.81)
RDW: 14.1 % (ref 11.5–15.5)
WBC: 22.8 10*3/uL — ABNORMAL HIGH (ref 4.0–10.5)

## 2015-07-13 LAB — CREATININE, URINE, RANDOM: CREATININE, URINE: 131.35 mg/dL

## 2015-07-13 LAB — SODIUM, URINE, RANDOM: SODIUM UR: 92 mmol/L

## 2015-07-13 LAB — STREP PNEUMONIAE URINARY ANTIGEN: STREP PNEUMO URINARY ANTIGEN: NEGATIVE

## 2015-07-13 LAB — INFLUENZA PANEL BY PCR (TYPE A & B)
H1N1FLUPCR: NOT DETECTED
INFLAPCR: NEGATIVE
INFLBPCR: NEGATIVE

## 2015-07-13 MED ORDER — VANCOMYCIN HCL IN DEXTROSE 1-5 GM/200ML-% IV SOLN
1000.0000 mg | INTRAVENOUS | Status: DC
  Administered 2015-07-14 – 2015-07-17 (×4): 1000 mg via INTRAVENOUS
  Filled 2015-07-13 (×5): qty 200

## 2015-07-13 MED ORDER — ENOXAPARIN SODIUM 40 MG/0.4ML ~~LOC~~ SOLN
40.0000 mg | SUBCUTANEOUS | Status: DC
  Administered 2015-07-13 – 2015-07-17 (×5): 40 mg via SUBCUTANEOUS
  Filled 2015-07-13 (×5): qty 0.4

## 2015-07-13 MED ORDER — CETYLPYRIDINIUM CHLORIDE 0.05 % MT LIQD
7.0000 mL | Freq: Two times a day (BID) | OROMUCOSAL | Status: DC
  Administered 2015-07-13 – 2015-07-18 (×10): 7 mL via OROMUCOSAL

## 2015-07-13 MED ORDER — ONDANSETRON HCL 4 MG/2ML IJ SOLN: 4.0000 mg | Freq: Three times a day (TID) | INTRAMUSCULAR | Status: DC | PRN

## 2015-07-13 NOTE — Progress Notes (Signed)
Utilization review completed.  

## 2015-07-13 NOTE — Progress Notes (Signed)
Pharmacy Antibiotic Note  Raymond Medina is a 82 y.o. male admitted on 07/12/2015 with sepsis from [facility].  Pharmacy has been consulted for vancomycin and zosyn dosing.  new PNA, recently DC home from rehab; hypothermic 32.2 in ED, WBC 15.8>>22.8; creat 1.3>>1.49 creat cl 26; got 1 gm at 1736 then 500 at 0545am.    2/10 CXR: new opacity L lung base, ?PNA   Plan: change vanc  to 1 gm q24 Zosyn 3.375 g IV q8h Monitor renal function, cultures, LOT, clinical progression Check AM BMET  Weight: 143 lb 4.8 oz (65 kg)  Temp (24hrs), Avg:95.1 F (35.1 C), Min:90 F (32.2 C), Max:97.9 F (36.6 C)   Recent Labs Lab 07/10/15 1153 07/12/15 1700 07/12/15 1834 07/12/15 1835 07/12/15 2030 07/13/15 0752  WBC 11.7* 15.8*  --   --   --  22.8*  CREATININE 1.08 1.46* 1.30*  --   --  1.49*  LATICACIDVEN  --   --   --  0.51 1.07  --     Estimated Creatinine Clearance: 32.7 mL/min (by C-G formula based on Cr of 1.49).    Allergies  Allergen Reactions  . Budesonide-Formoterol Fumarate     REACTION: dizzy  . Hydrocodone-Acetaminophen     REACTION: prostate/ constipation  . Omeprazole     dizzy  . Plavix [Clopidogrel Bisulfate]     rash  . Ranitidine Hcl     REACTION: abd pain    Antimicrobials this admission:  Vanc 2/10>> Zosyn 2/10>> Dose adjustments this admission: 2/11 change vanc from 500 q12 to 1 gm q24  Microbiology results: MRSA PCR + S pnemo uag neg Flu neg 2/10 Ucx>> 2/10 bc>>ngtd  Thank you for allowing pharmacy to be a part of this patient's care.  Eudelia Bunch, Pharm.D. [phone] 07/13/2015 12:25 PM

## 2015-07-13 NOTE — Progress Notes (Signed)
PT Cancellation Note  Patient Details Name: Raymond Medina MRN: [id number] DOB: 12-10-1926   Cancelled Treatment:    Reason Eval/Treat Not Completed: Medical issues which prohibited therapy Pt on strict bedrest. Will await increase in activity orders prior to initiation of PT evaluation.   Marguarite Arbour A Joselyn Edling 07/13/2015, 7:46 AM Wray Kearns, PT, DPT [phone]

## 2015-07-13 NOTE — Progress Notes (Signed)
[facility]                                                                              Patient Demographics  Raymond Medina, is a 82 y.o. male, DOB - 1926/09/19, Raymond Medina:[id number]  Admit date - 07/12/2015   Admitting Physician Ivor Costa, MD  Outpatient Primary MD for the patient is [name], MD  LOS - 1   Chief Complaint  Patient presents with  . Altered Mental Status  . Bradycardia      HPI on 07/12/15 by Dr. Ivor Costa Raymond Medina is a 82 y.o. male with PMH of hypertension, COPD, GERD, PVD, chronic neck pain, chronic back pain, prostate cancer (SS/P prostate radiation seeds 2000), CVA, chronic anemia, chronic kidney disease-stage III, alcohol abuse, who presents with unrespoonsiveness and generalized weakness.  Patient has AMS and is unresponsive, is unable to provide any medical history, therefore, history is obtained by discussing the case with ED physician, per EMS report, and with the nursing staff. The history is very limited.   It seems that pt was recently discharged home from rehab on 07/08/15 (Monday). He has home health nurse, who found that pt has have an increased weakness, lethargy, and decreased oral intake for several days. Today pt become unresponsive. When EMS arrived pt was unresponsive, with HR 46, bp 110/68, rr 22, O2 88% RAa and cbg 132. When I saw pt in ED, he has some cough. He is unresponsive, but mumbled. He seems moves all extremeties.  In ED, patient was found to have soft blood pressure 95/47, hypothermia with temperature 90--> 95.9, lactate 0.51, negative troponin, urinalysis with small moderate leukocytes, WBC 15.8, worsening renal function, negative CT-head for acute intracranial abnormalities. Chest x-Serviss showed left basalar infiltration. Patient admitted to inpatient for further interventional treatment.  Assessment & Plan   Sepsis due to HCAP -Was hypothermic with leukocytosis upon admission -Chest x-Sackmann showed new dense opacity in left lung  base -Continue IV fluid, vancomycin, Zosyn -Strep pneumonia urine and legionella antigens pending -Blood cultures cultures pending -Influenza negative  Acute encephalopathy and unresponsiveness -Likely secondary to sepsis -CT head was negative for acute cranial abnormalities -Continue neuro checks  Essential hypertension -Blood pressures have been soft, amlodipine held  Severe protein calorie malnutrition -Pending improvement in patient's encephalopathy  History of CVA -Continue aspirin suppository  Acute on chronic kidney disease, stage III  -Baseline Cr <1, currently 1.49 -Likely secondary to sepsis -Continue IVF and monitor BMP  Iron deficiency anemia -Baseline hemoglobin 9-10, currently 7.4 -Drop likely dilutional  -Continue to monitor CBC -Will transfuse if Hb <7 -Anemia panel Iron 26  History of alcohol abuse -Not on CIWA due to current state  Code Status: DNR  Family Communication: None at bedside. Son via phone.  Disposition Plan: Admitted.   Time Spent in minutes   30 minutes  Procedures  None  Consults   None  DVT Prophylaxis  Lovenox  Lab Results  Component Value Date   PLT 232 07/13/2015    Medications  Scheduled Meds: . antiseptic oral rinse  7 mL Mouth Rinse BID  . aspirin  150 mg Rectal Daily  . enoxaparin (LOVENOX) injection  40  mg Subcutaneous Q24H  . piperacillin-tazobactam (ZOSYN)  IV  3.375 g Intravenous Q8H  . vancomycin  500 mg Intravenous Q12H   Continuous Infusions:  PRN Meds:.ondansetron  Antibiotics    Anti-infectives    Start     Dose/Rate Route Frequency Ordered Stop   07/13/15 0600  vancomycin (VANCOCIN) 500 mg in sodium chloride 0.9 % 100 mL IVPB     500 mg 100 mL/hr over 60 Minutes Intravenous Every 12 hours 07/12/15 1756     07/13/15 0200  piperacillin-tazobactam (ZOSYN) IVPB 3.375 g     3.375 g 12.5 mL/hr over 240 Minutes Intravenous Every 8 hours 07/12/15 1756     07/12/15 1730  piperacillin-tazobactam  (ZOSYN) IVPB 3.375 g     3.375 g 100 mL/hr over 30 Minutes Intravenous  Once 07/12/15 1719 07/12/15 1806   07/12/15 1730  vancomycin (VANCOCIN) IVPB 1000 mg/200 mL premix     1,000 mg 200 mL/hr over 60 Minutes Intravenous  Once 07/12/15 1719 07/12/15 1836      Subjective:   [name] seen and examined today. Patient not very verbal.  Opens eyes to verbal and tactile stimuli.   Objective:   Filed Vitals:   07/13/15 0130 07/13/15 0147 07/13/15 0400 07/13/15 0700  BP:  109/72 102/52 103/45  Pulse: 88 86 85 77  Temp: 97.9 F (36.6 C)  97.7 F (36.5 C) 96.9 F (36.1 C)  TempSrc: Rectal  Axillary Axillary  Resp: 20 25 17 21   Weight: 65 kg (143 lb 4.8 oz)     SpO2: 99%  99%     Wt Readings from Last 3 Encounters:  07/13/15 65 kg (143 lb 4.8 oz)  07/10/15 58.514 kg (129 lb)  05/17/15 58.06 kg (128 lb)     Intake/Output Summary (Last 24 hours) at 07/13/15 1158 Last data filed at 07/13/15 0837  Gross per 24 hour  Intake   2910 ml  Output    750 ml  Net   2160 ml    Exam  General: Well developed, elderly, NAD  HEENT: NCAT, mucous membranes moist.   Cardiovascular: S1 S2 auscultated, no rubs, murmurs or gallops. Regular rate and rhythm.  Respiratory: Diminished breath sounds  Abdomen: Soft, nontender, nondistended, + bowel sounds  Extremities: warm dry without cyanosis clubbing  Neuro: Moves extremities sporadically, cannot assess due to current state  Psych: cannot assess due to current state  Data Review   Micro Results Recent Results (from the past 240 hour(s))  Culture, blood (routine x 2)     Status: None (Preliminary result)   Collection Time: 07/12/15  5:00 PM  Result Value Ref Range Status   Specimen Description BLOOD RIGHT ARM  Final   Special Requests BOTTLES DRAWN AEROBIC AND ANAEROBIC 3CC  Final   Culture NO GROWTH < 24 HOURS  Final   Report Status PENDING  Incomplete  MRSA PCR Screening     Status: Abnormal   Collection Time: 07/13/15  1:22  AM  Result Value Ref Range Status   MRSA by PCR POSITIVE (A) NEGATIVE Final    Comment:        The GeneXpert MRSA Assay (FDA approved for NASAL specimens only), is one component of a comprehensive MRSA colonization surveillance program. It is not intended to diagnose MRSA infection nor to guide or monitor treatment for MRSA infections. RESULT CALLED TO, READ BACK BY AND VERIFIED WITH: C [email or web address]  07/13/15 [facility]     Radiology Reports Dg Chest 2 View  07/10/2015  CLINICAL DATA:  Cough, congestion and chest pain for 3 weeks EXAM: CHEST  2 VIEW COMPARISON:  April 23, 2015 FINDINGS: The heart size and mediastinal contours are stable. The heart size is enlarged. The aorta is tortuous. There is mild atelectasis of left lung base. There is mild increased pulmonary interstitium bilaterally. There is no pulmonary edema or pleural effusion. The visualized skeletal structures are unremarkable. IMPRESSION: Mild enlarged heart with mild increased pulmonary interstitium bilaterally consistent with mild congestive heart failure. Mild atelectasis of left lung base. Electronically Signed   By: Abelardo Diesel M.D.   On: 07/10/2015 12:49   Ct Head Wo Contrast  07/12/2015  CLINICAL DATA:  Lethargy/altered mental status. History of prostate carcinoma. EXAM: CT HEAD WITHOUT CONTRAST TECHNIQUE: Contiguous axial images were obtained from the base of the skull through the vertex without intravenous contrast. COMPARISON:  April 23, 2015 FINDINGS: There is mild diffuse atrophy, stable. There is no intracranial mass, hemorrhage, extra-axial fluid collection, or midline shift there is patchy small vessel disease in the centra semiovale bilaterally. There are prior small lacunar infarcts in the superior medial left basal ganglia. There is small vessel disease in each thalamus. There is also small vessel disease throughout much of the right external capsule, stable. No acute appearing infarct is evident. Bony  calvarium appears intact. The mastoid air cells are clear. There are air-fluid levels in each maxillary antrum. No intraorbital lesions are apparent. IMPRESSION: Atrophy with small vessel disease and prior lacunar type infarcts as noted above. There is no intracranial mass, hemorrhage, or evidence of acute infarct. There are air-fluid levels in each maxillary antrum consistent with acute sinusitis bilaterally. Electronically Signed   By: Lowella Grip III M.D.   On: 07/12/2015 17:41   Dg Chest Port 1 View  07/12/2015  CLINICAL DATA:  Lethargy and low O2 sats for 1 day. EXAM: PORTABLE CHEST 1 VIEW COMPARISON:  Chest x-rays dated 07/10/2015 and 04/23/2015. Comparison also made to chest CT dated 04/23/2015. FINDINGS: Today's study is somewhat limited by patient positioning and motion artifact. There appears to be a dense opacity at the left lung base which is new, suspicious for developing pneumonia. Right lung remains grossly clear throughout. Cardiomediastinal silhouette is stable in size and configuration. IMPRESSION: New dense opacity at the left lung base which is highly suspicious for a developing pneumonia. Study limitations detailed above. Electronically Signed   By: Franki Cabot M.D.   On: 07/12/2015 18:29    CBC  Recent Labs Lab 07/10/15 1153 07/12/15 1700 07/12/15 1834 07/13/15 0752  WBC 11.7* 15.8*  --  22.8*  HGB 9.1* 8.5* 7.5* 7.4*  HCT 27.7* 26.0* 22.0* 22.5*  PLT 283.0 248  --  232  MCV 96.8 94.2  --  95.3  MCH  --  30.8  --  31.4  MCHC 32.8 32.7  --  32.9  RDW 14.4 13.9  --  14.1  LYMPHSABS 1.1 0.4*  --   --   MONOABS 0.7 0.8  --   --   EOSABS 0.3 0.0  --   --   BASOSABS 0.0 0.0  --   --     Chemistries   Recent Labs Lab 07/10/15 1153 07/12/15 1700 07/12/15 1834 07/13/15 0752  NA 140 143 144 146*  K 4.0 4.1 3.7 4.0  CL 108 110 112* 118*  CO2 24 22  --  16*  GLUCOSE 110* 147* 158* 108*  BUN 32* 51* 42* 45*  CREATININE 1.08 1.46* 1.30* 1.49*  CALCIUM 9.3  9.4  --  8.2*  MG 2.3  --   --   --   AST 37 36  --   --   ALT 27 34  --   --   ALKPHOS 140* 124  --   --   BILITOT 0.7 0.6  --   --    ------------------------------------------------------------------------------------------------------------------ estimated creatinine clearance is 32.7 mL/min (by C-G formula based on Cr of 1.49). ------------------------------------------------------------------------------------------------------------------ No results for input(s): HGBA1C in the last 72 hours. ------------------------------------------------------------------------------------------------------------------ No results for input(s): CHOL, HDL, LDLCALC, TRIG, CHOLHDL, LDLDIRECT in the last 72 hours. ------------------------------------------------------------------------------------------------------------------ No results for input(s): TSH, T4TOTAL, T3FREE, THYROIDAB in the last 72 hours.  Invalid input(s): FREET3 ------------------------------------------------------------------------------------------------------------------ No results for input(s): VITAMINB12, FOLATE, FERRITIN, TIBC, IRON, RETICCTPCT in the last 72 hours.  Coagulation profile No results for input(s): INR, PROTIME in the last 168 hours.  No results for input(s): DDIMER in the last 72 hours.  Cardiac Enzymes No results for input(s): CKMB, TROPONINI, MYOGLOBIN in the last 168 hours.  Invalid input(s): CK ------------------------------------------------------------------------------------------------------------------ Invalid input(s): POCBNP    Anneke Cundy D.O. on 07/13/2015 at 11:58 AM  Between 7am to 7pm - Pager - [phone]  After 7pm go to [email or web address] - password [email or web address]  And look for the night coverage person covering for me after hours  [facility] Office  [phone]

## 2015-07-14 ENCOUNTER — Inpatient Hospital Stay (HOSPITAL_COMMUNITY): Payer: Medicare Other

## 2015-07-14 DIAGNOSIS — J439 Emphysema, unspecified: Secondary | ICD-10-CM

## 2015-07-14 LAB — BASIC METABOLIC PANEL
Anion gap: 13 (ref 5–15)
BUN: 44 mg/dL — AB (ref 6–20)
CALCIUM: 8.7 mg/dL — AB (ref 8.9–10.3)
CO2: 16 mmol/L — ABNORMAL LOW (ref 22–32)
CREATININE: 1.45 mg/dL — AB (ref 0.61–1.24)
Chloride: 121 mmol/L — ABNORMAL HIGH (ref 101–111)
GFR calc Af Amer: 49 mL/min — ABNORMAL LOW (ref >60)
GFR, EST NON AFRICAN AMERICAN: 42 mL/min — AB (ref >60)
GLUCOSE: 83 mg/dL (ref 65–99)
Potassium: 4 mmol/L (ref 3.5–5.1)
Sodium: 150 mmol/L — ABNORMAL HIGH (ref 135–145)

## 2015-07-14 LAB — URINE CULTURE: CULTURE: NO GROWTH

## 2015-07-14 LAB — CBC
HEMATOCRIT: 24.5 % — AB (ref 39.0–52.0)
Hemoglobin: 8 g/dL — ABNORMAL LOW (ref 13.0–17.0)
MCH: 31.3 pg (ref 26.0–34.0)
MCHC: 32.7 g/dL (ref 30.0–36.0)
MCV: 95.7 fL (ref 78.0–100.0)
Platelets: 230 10*3/uL (ref 150–400)
RBC: 2.56 MIL/uL — ABNORMAL LOW (ref 4.22–5.81)
RDW: 14.3 % (ref 11.5–15.5)
WBC: 20 10*3/uL — ABNORMAL HIGH (ref 4.0–10.5)

## 2015-07-14 LAB — GLUCOSE, CAPILLARY: GLUCOSE-CAPILLARY: 107 mg/dL — AB (ref 65–99)

## 2015-07-14 MED ORDER — LORAZEPAM 2 MG/ML IJ SOLN
0.5000 mg | Freq: Once | INTRAMUSCULAR | Status: AC
  Administered 2015-07-14: 0.5 mg via INTRAVENOUS
  Filled 2015-07-14: qty 1

## 2015-07-14 MED ORDER — LORAZEPAM 2 MG/ML IJ SOLN: 1.0000 mg | Freq: Once | INTRAMUSCULAR | Status: DC

## 2015-07-14 MED ORDER — DEXTROSE 5 % IV SOLN
INTRAVENOUS | Status: DC
  Administered 2015-07-14 (×2): 75 mL via INTRAVENOUS
  Administered 2015-07-15: 1000 mL via INTRAVENOUS
  Administered 2015-07-16 – 2015-07-17 (×3): via INTRAVENOUS

## 2015-07-14 MED ORDER — INFLUENZA VAC SPLIT QUAD 0.5 ML IM SUSY
0.5000 mL | PREFILLED_SYRINGE | INTRAMUSCULAR | Status: AC
  Administered 2015-07-15: 0.5 mL via INTRAMUSCULAR
  Filled 2015-07-14: qty 0.5

## 2015-07-14 NOTE — Progress Notes (Signed)
PT Cancellation Note  Patient Details Name: Raymond Medina MRN: [id number] DOB: [birth year]   Cancelled Treatment:    Reason Eval/Treat Not Completed: Medical issues which prohibited therapy.  Patient remains on strict bedrest per orders.  MD*:  Please write activity orders when appropriate for patient.  PT will initiate evaluation at that time.  Thank you.   Despina Pole 07/14/2015, 11:18 AM Carita Pian. Sanjuana Kava, [place] Pager [phone]

## 2015-07-14 NOTE — Progress Notes (Signed)
[facility]                                                                              Patient Demographics  Raymond Medina, is a 82 y.o. male, DOB - May 15, 1927, [id number]  Admit date - 07/12/2015   Admitting Physician Ivor Costa, MD  Outpatient Primary MD for the patient is [name], MD  LOS - 2   Chief Complaint  Patient presents with  . Altered Mental Status  . Bradycardia      HPI on 07/12/15 by Dr. Ivor Costa Raymond Medina is a 82 y.o. male with PMH of hypertension, COPD, GERD, PVD, chronic neck pain, chronic back pain, prostate cancer (SS/P prostate radiation seeds 2000), CVA, chronic anemia, chronic kidney disease-stage III, alcohol abuse, who presents with unrespoonsiveness and generalized weakness.  Patient has AMS and is unresponsive, is unable to provide any medical history, therefore, history is obtained by discussing the case with ED physician, per EMS report, and with the nursing staff. The history is very limited.   It seems that pt was recently discharged home from rehab on 07/08/15 (Monday). He has home health nurse, who found that pt has have an increased weakness, lethargy, and decreased oral intake for several days. Today pt become unresponsive. When EMS arrived pt was unresponsive, with HR 46, bp 110/68, rr 22, O2 88% RAa and cbg 132. When I saw pt in ED, he has some cough. He is unresponsive, but mumbled. He seems moves all extremeties.  In ED, patient was found to have soft blood pressure 95/47, hypothermia with temperature 90--> 95.9, lactate 0.51, negative troponin, urinalysis with small moderate leukocytes, WBC 15.8, worsening renal function, negative CT-head for acute intracranial abnormalities. Chest x-Beckers showed left basalar infiltration. Patient admitted to inpatient for further interventional treatment.  Assessment & Plan   Sepsis due to HCAP -Was hypothermic with leukocytosis upon admission -Temperature improving -Chest x-Bessent showed new dense  opacity in left lung base -Continue IV fluid, vancomycin, Zosyn -Strep pneumonia urine Ag negative and legionella antigen pending -Blood cultures pending -Influenza negative  Acute encephalopathy and unresponsiveness -Likely secondary to sepsis -CT head was negative for acute cranial abnormalities -Continue neuro checks -Will try to obtain MRI head  Essential hypertension -Blood pressures have been soft, amlodipine held  Severe protein calorie malnutrition -Pending improvement in patient's encephalopathy  History of CVA -Continue aspirin suppository  Acute on chronic kidney disease, stage III  -Baseline Cr <1, currently 1.45 -Likely secondary to sepsis -Continue IVF and monitor BMP  Iron deficiency anemia -Baseline hemoglobin 9-10, currently 7.4 -Drop likely dilutional  -Pending CBC this morning -Will transfuse if Hb <7 -Anemia panel Iron 26  History of alcohol abuse -Not on CIWA due to current state  Hypernatremia/Hyperchloremia -Will change IVF to D5W and continue to monitor BMP  Code Status: DNR  Family Communication: None at bedside.   Disposition Plan: Admitted. Pending improvement of mental status  Time Spent in minutes   30 minutes  Procedures  None  Consults   None  DVT Prophylaxis  Lovenox  Lab Results  Component Value Date   PLT 232 07/13/2015    Medications  Scheduled Meds: . antiseptic  oral rinse  7 mL Mouth Rinse BID  . aspirin  150 mg Rectal Daily  . enoxaparin (LOVENOX) injection  40 mg Subcutaneous Q24H  . [START ON 07/15/2015] Influenza vac split quadrivalent PF  0.5 mL Intramuscular Tomorrow-1000  . piperacillin-tazobactam (ZOSYN)  IV  3.375 g Intravenous Q8H  . vancomycin  1,000 mg Intravenous Q24H   Continuous Infusions: . dextrose 75 mL (07/14/15 0800)   PRN Meds:.ondansetron  Antibiotics    Anti-infectives    Start     Dose/Rate Route Frequency Ordered Stop   07/14/15 0500  vancomycin (VANCOCIN) IVPB 1000 mg/200 mL  premix     1,000 mg 200 mL/hr over 60 Minutes Intravenous Every 24 hours 07/13/15 1223     07/13/15 0600  vancomycin (VANCOCIN) 500 mg in sodium chloride 0.9 % 100 mL IVPB  Status:  Discontinued     500 mg 100 mL/hr over 60 Minutes Intravenous Every 12 hours 07/12/15 1756 07/13/15 1223   07/13/15 0200  piperacillin-tazobactam (ZOSYN) IVPB 3.375 g     3.375 g 12.5 mL/hr over 240 Minutes Intravenous Every 8 hours 07/12/15 1756     07/12/15 1730  piperacillin-tazobactam (ZOSYN) IVPB 3.375 g     3.375 g 100 mL/hr over 30 Minutes Intravenous  Once 07/12/15 1719 07/12/15 1806   07/12/15 1730  vancomycin (VANCOCIN) IVPB 1000 mg/200 mL premix     1,000 mg 200 mL/hr over 60 Minutes Intravenous  Once 07/12/15 1719 07/12/15 1836      Subjective:   Raymond Medina seen and examined today. Patient not very verbal and still unresponsive.    Objective:   Filed Vitals:   07/13/15 2000 07/14/15 0000 07/14/15 0400 07/14/15 0700  BP: 126/59 137/69 144/74 141/77  Pulse: 78 84 83 85  Temp: 97.9 F (36.6 C) 97.7 F (36.5 C) 97.7 F (36.5 C) 98.5 F (36.9 C)  TempSrc: Axillary Axillary Axillary Axillary  Resp: 22 25 23 23   Weight:      SpO2: 98% 94% 92% 93%    Wt Readings from Last 3 Encounters:  07/13/15 65 kg (143 lb 4.8 oz)  07/10/15 58.514 kg (129 lb)  05/17/15 58.06 kg (128 lb)     Intake/Output Summary (Last 24 hours) at 07/14/15 1034 Last data filed at 07/14/15 0926  Gross per 24 hour  Intake  357.5 ml  Output    900 ml  Net -542.5 ml    Exam  General: Well developed, elderly, NAD  HEENT: NCAT, mucous membranes moist.   Cardiovascular: S1 S2 auscultated, RRR, no murmurs  Respiratory: Diminished breath sounds  Abdomen: Soft, nontender, nondistended, + bowel sounds  Extremities: warm dry without cyanosis clubbing  Neuro: Moves extremities sporadically  Skin: Several skin tears   Data Review   Micro Results Recent Results (from the past 240 hour(s))  Culture, blood  (routine x 2)     Status: None (Preliminary result)   Collection Time: 07/12/15  5:00 PM  Result Value Ref Range Status   Specimen Description BLOOD RIGHT ARM  Final   Special Requests BOTTLES DRAWN AEROBIC AND ANAEROBIC 3CC  Final   Culture NO GROWTH < 24 HOURS  Final   Report Status PENDING  Incomplete  MRSA PCR Screening     Status: Abnormal   Collection Time: 07/13/15  1:22 AM  Result Value Ref Range Status   MRSA by PCR POSITIVE (A) NEGATIVE Final    Comment:        The GeneXpert MRSA Assay (FDA approved  for NASAL specimens only), is one component of a comprehensive MRSA colonization surveillance program. It is not intended to diagnose MRSA infection nor to guide or monitor treatment for MRSA infections. RESULT CALLED TO, READ BACK BY AND VERIFIED WITH: C [email or web address]  07/13/15 [facility]     Radiology Reports Dg Chest 2 View  07/10/2015  CLINICAL DATA:  Cough, congestion and chest pain for 3 weeks EXAM: CHEST  2 VIEW COMPARISON:  April 23, 2015 FINDINGS: The heart size and mediastinal contours are stable. The heart size is enlarged. The aorta is tortuous. There is mild atelectasis of left lung base. There is mild increased pulmonary interstitium bilaterally. There is no pulmonary edema or pleural effusion. The visualized skeletal structures are unremarkable. IMPRESSION: Mild enlarged heart with mild increased pulmonary interstitium bilaterally consistent with mild congestive heart failure. Mild atelectasis of left lung base. Electronically Signed   By: May 06, 2015 M.D.   On: 07/10/2015 12:49   Ct Head Wo Contrast  07/12/2015  CLINICAL DATA:  Lethargy/altered mental status. History of prostate carcinoma. EXAM: CT HEAD WITHOUT CONTRAST TECHNIQUE: Contiguous axial images were obtained from the base of the skull through the vertex without intravenous contrast. COMPARISON:  April 23, 2015 FINDINGS: There is mild diffuse atrophy, stable. There is no intracranial mass, hemorrhage,  extra-axial fluid collection, or midline shift there is patchy small vessel disease in the centra semiovale bilaterally. There are prior small lacunar infarcts in the superior medial left basal ganglia. There is small vessel disease in each thalamus. There is also small vessel disease throughout much of the right external capsule, stable. No acute appearing infarct is evident. Bony calvarium appears intact. The mastoid air cells are clear. There are air-fluid levels in each maxillary antrum. No intraorbital lesions are apparent. IMPRESSION: Atrophy with small vessel disease and prior lacunar type infarcts as noted above. There is no intracranial mass, hemorrhage, or evidence of acute infarct. There are air-fluid levels in each maxillary antrum consistent with acute sinusitis bilaterally. Electronically Signed   By: May 06, 2015 III M.D.   On: 07/12/2015 17:41   Dg Chest Port 1 View  07/12/2015  CLINICAL DATA:  Lethargy and low O2 sats for 1 day. EXAM: PORTABLE CHEST 1 VIEW COMPARISON:  Chest x-rays dated 07/10/2015 and 04/23/2015. Comparison also made to chest CT dated 04/23/2015. FINDINGS: Today's study is somewhat limited by patient positioning and motion artifact. There appears to be a dense opacity at the left lung base which is new, suspicious for developing pneumonia. Right lung remains grossly clear throughout. Cardiomediastinal silhouette is stable in size and configuration. IMPRESSION: New dense opacity at the left lung base which is highly suspicious for a developing pneumonia. Study limitations detailed above. Electronically Signed   By: 05/06/2015 M.D.   On: 07/12/2015 18:29    CBC  Recent Labs Lab 07/10/15 1153 07/12/15 1700 07/12/15 1834 07/13/15 0752  WBC 11.7* 15.8*  --  22.8*  HGB 9.1* 8.5* 7.5* 7.4*  HCT 27.7* 26.0* 22.0* 22.5*  PLT 283.0 248  --  232  MCV 96.8 94.2  --  95.3  MCH  --  30.8  --  31.4  MCHC 32.8 32.7  --  32.9  RDW 14.4 13.9  --  14.1  LYMPHSABS 1.1  0.4*  --   --   MONOABS 0.7 0.8  --   --   EOSABS 0.3 0.0  --   --   BASOSABS 0.0 0.0  --   --  Chemistries   Recent Labs Lab 07/10/15 1153 07/12/15 1700 07/12/15 1834 07/13/15 0752 07/14/15 0352  NA 140 143 144 146* 150*  K 4.0 4.1 3.7 4.0 4.0  CL 108 110 112* 118* 121*  CO2 24 22  --  16* 16*  GLUCOSE 110* 147* 158* 108* 83  BUN 32* 51* 42* 45* 44*  CREATININE 1.08 1.46* 1.30* 1.49* 1.45*  CALCIUM 9.3 9.4  --  8.2* 8.7*  MG 2.3  --   --   --   --   AST 37 36  --   --   --   ALT 27 34  --   --   --   ALKPHOS 140* 124  --   --   --   BILITOT 0.7 0.6  --   --   --    ------------------------------------------------------------------------------------------------------------------ estimated creatinine clearance is 33.6 mL/min (by C-G formula based on Cr of 1.45). ------------------------------------------------------------------------------------------------------------------ No results for input(s): HGBA1C in the last 72 hours. ------------------------------------------------------------------------------------------------------------------ No results for input(s): CHOL, HDL, LDLCALC, TRIG, CHOLHDL, LDLDIRECT in the last 72 hours. ------------------------------------------------------------------------------------------------------------------ No results for input(s): TSH, T4TOTAL, T3FREE, THYROIDAB in the last 72 hours.  Invalid input(s): FREET3 ------------------------------------------------------------------------------------------------------------------ No results for input(s): VITAMINB12, FOLATE, FERRITIN, TIBC, IRON, RETICCTPCT in the last 72 hours.  Coagulation profile No results for input(s): INR, PROTIME in the last 168 hours.  No results for input(s): DDIMER in the last 72 hours.  Cardiac Enzymes No results for input(s): CKMB, TROPONINI, MYOGLOBIN in the last 168 hours.  Invalid input(s):  CK ------------------------------------------------------------------------------------------------------------------ Invalid input(s): POCBNP    Jensine Luz D.O. on 07/14/2015 at 10:34 AM  Between 7am to 7pm - Pager - [phone]  After 7pm go to [email or web address] - password [email or web address]  And look for the night coverage person covering for me after hours  [facility] Office  [phone]

## 2015-07-15 ENCOUNTER — Inpatient Hospital Stay (HOSPITAL_COMMUNITY): Payer: Medicare Other

## 2015-07-15 DIAGNOSIS — R4182 Altered mental status, unspecified: Secondary | ICD-10-CM

## 2015-07-15 DIAGNOSIS — A419 Sepsis, unspecified organism: Secondary | ICD-10-CM | POA: Diagnosis not present

## 2015-07-15 LAB — BASIC METABOLIC PANEL
ANION GAP: 11 (ref 5–15)
BUN: 38 mg/dL — ABNORMAL HIGH (ref 6–20)
CHLORIDE: 118 mmol/L — AB (ref 101–111)
CO2: 18 mmol/L — AB (ref 22–32)
Calcium: 8.4 mg/dL — ABNORMAL LOW (ref 8.9–10.3)
Creatinine, Ser: 1.42 mg/dL — ABNORMAL HIGH (ref 0.61–1.24)
GFR calc non Af Amer: 43 mL/min — ABNORMAL LOW (ref >60)
GFR, EST AFRICAN AMERICAN: 50 mL/min — AB (ref >60)
GLUCOSE: 109 mg/dL — AB (ref 65–99)
Potassium: 3.1 mmol/L — ABNORMAL LOW (ref 3.5–5.1)
Sodium: 147 mmol/L — ABNORMAL HIGH (ref 135–145)

## 2015-07-15 LAB — CBC
HEMATOCRIT: 23.1 % — AB (ref 39.0–52.0)
HEMOGLOBIN: 7.6 g/dL — AB (ref 13.0–17.0)
MCH: 31.4 pg (ref 26.0–34.0)
MCHC: 32.9 g/dL (ref 30.0–36.0)
MCV: 95.5 fL (ref 78.0–100.0)
Platelets: 234 10*3/uL (ref 150–400)
RBC: 2.42 MIL/uL — ABNORMAL LOW (ref 4.22–5.81)
RDW: 14.4 % (ref 11.5–15.5)
WBC: 16.8 10*3/uL — ABNORMAL HIGH (ref 4.0–10.5)

## 2015-07-15 LAB — LEGIONELLA ANTIGEN, URINE

## 2015-07-15 LAB — GLUCOSE, CAPILLARY: GLUCOSE-CAPILLARY: 86 mg/dL (ref 65–99)

## 2015-07-15 LAB — VITAMIN B12: VITAMIN B 12: 1757 pg/mL — AB (ref 180–914)

## 2015-07-15 LAB — TSH: TSH: 1.459 u[IU]/mL (ref 0.350–4.500)

## 2015-07-15 MED ORDER — POTASSIUM CHLORIDE 10 MEQ/100ML IV SOLN
10.0000 meq | INTRAVENOUS | Status: AC
  Administered 2015-07-15 (×4): 10 meq via INTRAVENOUS
  Filled 2015-07-15 (×4): qty 100

## 2015-07-15 MED ORDER — CHLORHEXIDINE GLUCONATE CLOTH 2 % EX PADS
6.0000 | MEDICATED_PAD | Freq: Every day | CUTANEOUS | Status: DC
  Administered 2015-07-16 – 2015-07-18 (×3): 6 via TOPICAL

## 2015-07-15 MED ORDER — MUPIROCIN 2 % EX OINT
1.0000 "application " | TOPICAL_OINTMENT | Freq: Two times a day (BID) | CUTANEOUS | Status: DC
  Administered 2015-07-15 – 2015-07-18 (×6): 1 via NASAL
  Filled 2015-07-15: qty 22

## 2015-07-15 NOTE — Procedures (Signed)
ELECTROENCEPHALOGRAM REPORT  Patient: Raymond Medina       Room #: [id number]  EEG No. ID: [id number] Age: 82 y.o.        Sex: male Referring Physician: Curly Shores Report Date:  07/15/2015        Interpreting Physician: Anthony Sar  History: [name] is an 82 y.o. male history of hypertension, COPD, peripheral vascular disease, chronic back pain, prostate cancer, chronic anemia, chronic kidney disease, alcohol abuse and old stroke, who presented on 07/12/2015 with decreased responsiveness and generalized weakness.  Indications for study:  Assess severity encephalopathy; rule out seizure activity.  Technique: This is an 18 channel routine scalp EEG performed at the bedside with bipolar and monopolar montages arranged in accordance to the international 10/20 system of electrode placement.   Description: Patient was noted to be stuporous at the time of this EEG recording. Predominant background activity consisted of low to moderate amplitude diffuse mixed delta and theta activity as well as frequent generalized moderate to slightly high amplitude triphasic waves, which at times appeared to be periodic. Photic stimulation was not performed. No epileptiform discharges were recorded.  Interpretation: This EEG recording was abnormal with moderately severe generalized continuous slowing of cerebral territory diffusely. Findings were nonspecific and can be seen with a wide variety of etiologies, including metabolic and toxic disorders. Triphasic waves are most often associated with hepatic encephalopathy, but can be seen with other metabolic conditions as well. No evidence of epileptic activity was demonstrated.   Rush Farmer M.D. [facility] [phone]

## 2015-07-15 NOTE — Progress Notes (Signed)
EEG completed; results pending.    

## 2015-07-15 NOTE — Progress Notes (Signed)
PT Cancellation Note  Patient Details Name: [name] MRN: [id number] DOB: 1926-11-10   Cancelled Treatment:    Reason Eval/Treat Not Completed: Medical issues which prohibited therapy (MD removed bedrest order; pt unresponsive,nrg state to HOLD PT.)  Will check back tomorrow.  Thanks.    Irwin Brakeman F 07/15/2015, 10:36 AM [facility] [phone] (pager)

## 2015-07-15 NOTE — Progress Notes (Signed)
[facility]                                                                              Patient Demographics  Raymond Medina, is a 82 y.o. male, DOB - 1927-01-16, [id number]  Admit date - 07/12/2015   Admitting Physician Ivor Costa, MD  Outpatient Primary MD for the patient is [name], MD  LOS - 3   Chief Complaint  Patient presents with  . Altered Mental Status  . Bradycardia      HPI on 07/12/15 by Dr. Ivor Costa KELLON MARTINKA is a 82 y.o. male with PMH of hypertension, COPD, GERD, PVD, chronic neck pain, chronic back pain, prostate cancer (SS/P prostate radiation seeds 2000), CVA, chronic anemia, chronic kidney disease-stage III, alcohol abuse, who presents with unrespoonsiveness and generalized weakness.  Patient has AMS and is unresponsive, is unable to provide any medical history, therefore, history is obtained by discussing the case with ED physician, per EMS report, and with the nursing staff. The history is very limited.   It seems that pt was recently discharged home from rehab on 07/08/15 (Monday). He has home health nurse, who found that pt has have an increased weakness, lethargy, and decreased oral intake for several days. Today pt become unresponsive. When EMS arrived pt was unresponsive, with HR 46, bp 110/68, rr 22, O2 88% RAa and cbg 132. When I saw pt in ED, he has some cough. He is unresponsive, but mumbled. He seems moves all extremeties.  In ED, patient was found to have soft blood pressure 95/47, hypothermia with temperature 90--> 95.9, lactate 0.51, negative troponin, urinalysis with small moderate leukocytes, WBC 15.8, worsening renal function, negative CT-head for acute intracranial abnormalities. Chest x-Jessop showed left basalar infiltration. Patient admitted to inpatient for further interventional treatment.  Assessment & Plan   Sepsis due to HCAP -Was hypothermic with leukocytosis upon admission -Now patient afebrile, leukocytosis trending  downward -Chest x-Rigaud showed new dense opacity in left lung base -Continue IV fluid, vancomycin, Zosyn -Strep pneumonia and legionella urine Ag negative  -Blood cultures no growth to date -Influenza negative  Acute encephalopathy and unresponsiveness -Likely secondary to sepsis -CT head was negative for acute cranial abnormalities -Continue neuro checks -MRI brain: chronic microvascular ischemia, stable remote lacunar infarcts of basal ganglia and thalami  Essential hypertension -Blood pressures have been soft, amlodipine held  Severe protein calorie malnutrition -Pending improvement in patient's encephalopathy  History of CVA -Continue aspirin suppository  Acute on chronic kidney disease, stage III  -Baseline Cr <1, currently 1.42 -Likely secondary to sepsis -Continue IVF and monitor BMP  Iron deficiency anemia -Baseline hemoglobin 9-10, currently 7.6 -Drop likely dilutional  -Will transfuse if Hb <7 -Anemia panel Iron 26  History of alcohol abuse -Not on CIWA due to current state  Hypernatremia/Hyperchloremia -Continue D5W and continue to monitor BMP  Hypokalemia  -potassium 3.1, will replace and continue to monitor BMP  Code Status: DNR  Family Communication: None at bedside. Spoke with sister via phone.  Disposition Plan: Admitted. Pending improvement of mental status  Time Spent in minutes   30 minutes  Procedures  None  Consults   None  DVT Prophylaxis  Lovenox  Lab Results  Component Value Date   PLT 234 07/15/2015    Medications  Scheduled Meds: . antiseptic oral rinse  7 mL Mouth Rinse BID  . aspirin  150 mg Rectal Daily  . enoxaparin (LOVENOX) injection  40 mg Subcutaneous Q24H  . Influenza vac split quadrivalent PF  0.5 mL Intramuscular Tomorrow-1000  . piperacillin-tazobactam (ZOSYN)  IV  3.375 g Intravenous Q8H  . vancomycin  1,000 mg Intravenous Q24H   Continuous Infusions: . dextrose 1,000 mL (07/15/15 0815)   PRN  Meds:.ondansetron  Antibiotics    Anti-infectives    Start     Dose/Rate Route Frequency Ordered Stop   07/14/15 0500  vancomycin (VANCOCIN) IVPB 1000 mg/200 mL premix     1,000 mg 200 mL/hr over 60 Minutes Intravenous Every 24 hours 07/13/15 1223     07/13/15 0600  vancomycin (VANCOCIN) 500 mg in sodium chloride 0.9 % 100 mL IVPB  Status:  Discontinued     500 mg 100 mL/hr over 60 Minutes Intravenous Every 12 hours 07/12/15 1756 07/13/15 1223   07/13/15 0200  piperacillin-tazobactam (ZOSYN) IVPB 3.375 g     3.375 g 12.5 mL/hr over 240 Minutes Intravenous Every 8 hours 07/12/15 1756     07/12/15 1730  piperacillin-tazobactam (ZOSYN) IVPB 3.375 g     3.375 g 100 mL/hr over 30 Minutes Intravenous  Once 07/12/15 1719 07/12/15 1806   07/12/15 1730  vancomycin (VANCOCIN) IVPB 1000 mg/200 mL premix     1,000 mg 200 mL/hr over 60 Minutes Intravenous  Once 07/12/15 1719 07/12/15 1836      Subjective:   [name] seen and examined today. Patient not very verbal and still unresponsive.    Objective:   Filed Vitals:   07/14/15 2000 07/15/15 0000 07/15/15 0400 07/15/15 1207  BP: 137/62 130/73 138/56   Pulse: 35 80 74   Temp: 100.3 F (37.9 C) 99 F (37.2 C) 100.7 F (38.2 C)   TempSrc: Axillary Axillary Axillary   Resp: 35 28 26   Height:    5\' 7"  (1.702 m)  Weight:      SpO2: 88% 94%      Wt Readings from Last 3 Encounters:  07/13/15 65 kg (143 lb 4.8 oz)  07/10/15 58.514 kg (129 lb)  05/17/15 58.06 kg (128 lb)     Intake/Output Summary (Last 24 hours) at 07/15/15 1242 Last data filed at 07/15/15 0506  Gross per 24 hour  Intake    525 ml  Output    550 ml  Net    -25 ml    Exam (no change)  General: Well developed, elderly, NAD  HEENT: NCAT, mucous membranes moist.   Cardiovascular: S1 S2 auscultated, RRR, no murmurs  Respiratory: Diminished breath sounds  Abdomen: Soft, nontender, nondistended, + bowel sounds  Extremities: warm dry without cyanosis  clubbing  Neuro: Moves extremities sporadically  Skin: Several skin tears   Data Review   Micro Results Recent Results (from the past 240 hour(s))  Culture, blood (routine x 2)     Status: None (Preliminary result)   Collection Time: 07/12/15  5:00 PM  Result Value Ref Range Status   Specimen Description BLOOD RIGHT ARM  Final   Special Requests BOTTLES DRAWN AEROBIC AND ANAEROBIC 3CC  Final   Culture NO GROWTH 2 DAYS  Final   Report Status PENDING  Incomplete  Culture, blood (routine x 2)     Status: None (Preliminary result)   Collection Time: 07/12/15  6:43 PM  Result Value Ref Range Status   Specimen Description BLOOD RIGHT HAND  Final   Special Requests IN PEDIATRIC BOTTLE 2CC  Final   Culture NO GROWTH 1 DAY  Final   Report Status PENDING  Incomplete  Urine culture     Status: None   Collection Time: 07/12/15  7:38 PM  Result Value Ref Range Status   Specimen Description URINE, CATHETERIZED  Final   Special Requests NONE  Final   Culture NO GROWTH 1 DAY  Final   Report Status 07/14/2015 FINAL  Final  MRSA PCR Screening     Status: Abnormal   Collection Time: 07/13/15  1:22 AM  Result Value Ref Range Status   MRSA by PCR POSITIVE (A) NEGATIVE Final    Comment:        The GeneXpert MRSA Assay (FDA approved for NASAL specimens only), is one component of a comprehensive MRSA colonization surveillance program. It is not intended to diagnose MRSA infection nor to guide or monitor treatment for MRSA infections. RESULT CALLED TO, READ BACK BY AND VERIFIED WITH: C [email or web address]  07/13/15 [facility]     Radiology Reports Dg Chest 2 View  07/10/2015  CLINICAL DATA:  Cough, congestion and chest pain for 3 weeks EXAM: CHEST  2 VIEW COMPARISON:  April 23, 2015 FINDINGS: The heart size and mediastinal contours are stable. The heart size is enlarged. The aorta is tortuous. There is mild atelectasis of left lung base. There is mild increased pulmonary interstitium bilaterally.  There is no pulmonary edema or pleural effusion. The visualized skeletal structures are unremarkable. IMPRESSION: Mild enlarged heart with mild increased pulmonary interstitium bilaterally consistent with mild congestive heart failure. Mild atelectasis of left lung base. Electronically Signed   By: Abelardo Diesel M.D.   On: 07/10/2015 12:49   Ct Head Wo Contrast  07/12/2015  CLINICAL DATA:  Lethargy/altered mental status. History of prostate carcinoma. EXAM: CT HEAD WITHOUT CONTRAST TECHNIQUE: Contiguous axial images were obtained from the base of the skull through the vertex without intravenous contrast. COMPARISON:  April 23, 2015 FINDINGS: There is mild diffuse atrophy, stable. There is no intracranial mass, hemorrhage, extra-axial fluid collection, or midline shift there is patchy small vessel disease in the centra semiovale bilaterally. There are prior small lacunar infarcts in the superior medial left basal ganglia. There is small vessel disease in each thalamus. There is also small vessel disease throughout much of the right external capsule, stable. No acute appearing infarct is evident. Bony calvarium appears intact. The mastoid air cells are clear. There are air-fluid levels in each maxillary antrum. No intraorbital lesions are apparent. IMPRESSION: Atrophy with small vessel disease and prior lacunar type infarcts as noted above. There is no intracranial mass, hemorrhage, or evidence of acute infarct. There are air-fluid levels in each maxillary antrum consistent with acute sinusitis bilaterally. Electronically Signed   By: Lowella Grip III M.D.   On: 07/12/2015 17:41   Mr Brain Wo Contrast  07/14/2015  CLINICAL DATA:  Acute encephalopathy. Failure to thrive. Personal history of prostate cancer. Altered mental status. EXAM: MRI HEAD WITHOUT CONTRAST TECHNIQUE: Multiplanar, multiecho pulse sequences of the brain and surrounding structures were obtained without intravenous contrast. COMPARISON:   CT head without contrast 07/12/2015. MRI brain 09/09/2010. FINDINGS: Moderate atrophy and diffuse white matter disease is similar the prior exam. There are remote lacunar infarcts in the basal ganglia and thalami bilaterally without significant interval change. The ventricles are proportionate to the degree of atrophy. White  matter changes extend into the brainstem. The internal auditory canals are within normal limits bilaterally. Flow is present in the major intracranial arteries. Bilateral lens replacements are present. Fluid levels are present in the maxillary sinuses bilaterally. The remaining paranasal sinuses are clear. Bilateral mastoid effusions are noted. No obstructing nasopharyngeal lesion is evident. The skullbase is otherwise within normal limits. Sagittal images are unremarkable. IMPRESSION: 1. Similar appearance of moderate atrophy and diffuse white matter disease likely reflecting the sequela of chronic microvascular ischemia. 2. Stable remote lacunar infarcts of the basal ganglia and thalami. Electronically Signed   By: San Morelle M.D.   On: 07/14/2015 14:07   Dg Chest Port 1 View  07/12/2015  CLINICAL DATA:  Lethargy and low O2 sats for 1 day. EXAM: PORTABLE CHEST 1 VIEW COMPARISON:  Chest x-rays dated 07/10/2015 and 04/23/2015. Comparison also made to chest CT dated 04/23/2015. FINDINGS: Today's study is somewhat limited by patient positioning and motion artifact. There appears to be a dense opacity at the left lung base which is new, suspicious for developing pneumonia. Right lung remains grossly clear throughout. Cardiomediastinal silhouette is stable in size and configuration. IMPRESSION: New dense opacity at the left lung base which is highly suspicious for a developing pneumonia. Study limitations detailed above. Electronically Signed   By: Franki Cabot M.D.   On: 07/12/2015 18:29    CBC  Recent Labs Lab 07/10/15 1153 07/12/15 1700 07/12/15 1834 07/13/15 0752  07/14/15 1018 07/15/15 0242  WBC 11.7* 15.8*  --  22.8* 20.0* 16.8*  HGB 9.1* 8.5* 7.5* 7.4* 8.0* 7.6*  HCT 27.7* 26.0* 22.0* 22.5* 24.5* 23.1*  PLT 283.0 248  --  232 230 234  MCV 96.8 94.2  --  95.3 95.7 95.5  MCH  --  30.8  --  31.4 31.3 31.4  MCHC 32.8 32.7  --  32.9 32.7 32.9  RDW 14.4 13.9  --  14.1 14.3 14.4  LYMPHSABS 1.1 0.4*  --   --   --   --   MONOABS 0.7 0.8  --   --   --   --   EOSABS 0.3 0.0  --   --   --   --   BASOSABS 0.0 0.0  --   --   --   --     Chemistries   Recent Labs Lab 07/10/15 1153 07/12/15 1700 07/12/15 1834 07/13/15 0752 07/14/15 0352 07/15/15 0242  NA 140 143 144 146* 150* 147*  K 4.0 4.1 3.7 4.0 4.0 3.1*  CL 108 110 112* 118* 121* 118*  CO2 24 22  --  16* 16* 18*  GLUCOSE 110* 147* 158* 108* 83 109*  BUN 32* 51* 42* 45* 44* 38*  CREATININE 1.08 1.46* 1.30* 1.49* 1.45* 1.42*  CALCIUM 9.3 9.4  --  8.2* 8.7* 8.4*  MG 2.3  --   --   --   --   --   AST 37 36  --   --   --   --   ALT 27 34  --   --   --   --   ALKPHOS 140* 124  --   --   --   --   BILITOT 0.7 0.6  --   --   --   --    ------------------------------------------------------------------------------------------------------------------ estimated creatinine clearance is 34.3 mL/min (by C-G formula based on Cr of 1.42). ------------------------------------------------------------------------------------------------------------------ No results for input(s): HGBA1C in the last 72 hours. ------------------------------------------------------------------------------------------------------------------ No results for input(s): CHOL,  HDL, LDLCALC, TRIG, CHOLHDL, LDLDIRECT in the last 72 hours. ------------------------------------------------------------------------------------------------------------------ No results for input(s): TSH, T4TOTAL, T3FREE, THYROIDAB in the last 72 hours.  Invalid input(s):  FREET3 ------------------------------------------------------------------------------------------------------------------ No results for input(s): VITAMINB12, FOLATE, FERRITIN, TIBC, IRON, RETICCTPCT in the last 72 hours.  Coagulation profile No results for input(s): INR, PROTIME in the last 168 hours.  No results for input(s): DDIMER in the last 72 hours.  Cardiac Enzymes No results for input(s): CKMB, TROPONINI, MYOGLOBIN in the last 168 hours.  Invalid input(s): CK ------------------------------------------------------------------------------------------------------------------ Invalid input(s): POCBNP    Renu Asby D.O. on 07/15/2015 at 12:42 PM  Between 7am to 7pm - Pager - [phone]  After 7pm go to [email or web address] - password [email or web address]  And look for the night coverage person covering for me after hours  [facility] Office  [phone]

## 2015-07-15 NOTE — Progress Notes (Addendum)
Initial Nutrition Assessment  DOCUMENTATION CODES:   Not applicable  INTERVENTION:   Advance diet as medically appropriate, add supplements when/as able  NUTRITION DIAGNOSIS:   Increased nutrient needs related to chronic illness, wound healing as evidenced by estimated needs   GOAL:   Patient will meet greater than or equal to 90% of their needs  MONITOR:   Diet advancement, PO intake, Labs, Weight trends, Skin, I & O's  REASON FOR ASSESSMENT:   Low Braden  ASSESSMENT:   82 y.o. Male with PMH of hypertension, COPD, GERD, PVD, chronic neck pain, chronic back pain, prostate cancer (SS/P prostate radiation seeds 2000), CVA, chronic anemia, chronic kidney disease-stage III, alcohol abuse, who presents with unrespoonsiveness and generalized weakness.  RD unable obtain nutrition hx or complete Nutrition Focused Physical Exam at time of visit.   Suspect malnutrition.  Patient is currently NPO.   Low braden score places pt at risk for further skin breakdown.  Diet Order:  Diet NPO time specified  Skin:  Wound (see comment) (Stage I to sacrum)  Last BM:  2/9  Height:   Ht Readings from Last 1 Encounters:  07/15/15 5\' 7"  (1.702 m)    Weight:   Wt Readings from Last 1 Encounters:  07/13/15 143 lb 4.8 oz (65 kg)    Ideal Body Weight:  67.2 kg  BMI:  Body mass index is 22.44 kg/(m^2).  Estimated Nutritional Needs:   Kcal:  1400-1600  Protein:  70-80 gm  Fluid:  >/= 1.5 L  EDUCATION NEEDS:   No education needs identified at this time  Arthur Holms, RD, LDN Pager #: [phone] After-Hours Pager #: [phone]

## 2015-07-15 NOTE — Progress Notes (Signed)
OT Cancellation Note  Patient Details Name: Raymond Medina MRN: [id number] DOB: 1927/05/22   Cancelled Treatment:    Reason Eval/Treat Not Completed: Patient not medically ready (Strict bedrest orders)  Peri Maris  Pager: [phone]  07/15/2015, 7:18 AM

## 2015-07-16 ENCOUNTER — Inpatient Hospital Stay (HOSPITAL_COMMUNITY): Payer: Medicare Other

## 2015-07-16 LAB — BASIC METABOLIC PANEL
ANION GAP: 8 (ref 5–15)
BUN: 31 mg/dL — ABNORMAL HIGH (ref 6–20)
CALCIUM: 8.4 mg/dL — AB (ref 8.9–10.3)
CO2: 19 mmol/L — AB (ref 22–32)
Chloride: 116 mmol/L — ABNORMAL HIGH (ref 101–111)
Creatinine, Ser: 1.49 mg/dL — ABNORMAL HIGH (ref 0.61–1.24)
GFR, EST AFRICAN AMERICAN: 47 mL/min — AB (ref >60)
GFR, EST NON AFRICAN AMERICAN: 41 mL/min — AB (ref >60)
Glucose, Bld: 95 mg/dL (ref 65–99)
POTASSIUM: 3.7 mmol/L (ref 3.5–5.1)
Sodium: 143 mmol/L (ref 135–145)

## 2015-07-16 LAB — CBC
HEMATOCRIT: 22.9 % — AB (ref 39.0–52.0)
Hemoglobin: 7.6 g/dL — ABNORMAL LOW (ref 13.0–17.0)
MCH: 31.8 pg (ref 26.0–34.0)
MCHC: 33.2 g/dL (ref 30.0–36.0)
MCV: 95.8 fL (ref 78.0–100.0)
PLATELETS: 220 10*3/uL (ref 150–400)
RBC: 2.39 MIL/uL — AB (ref 4.22–5.81)
RDW: 14.5 % (ref 11.5–15.5)
WBC: 11.6 10*3/uL — AB (ref 4.0–10.5)

## 2015-07-16 LAB — AMMONIA: Ammonia: 32 umol/L (ref 9–35)

## 2015-07-16 NOTE — Progress Notes (Signed)
[facility]                                                                              Patient Demographics  Raymond Medina, is a 82 y.o. male, DOB - 28-May-1927, [id number]  Admit date - 07/12/2015   Admitting Physician Ivor Costa, MD  Outpatient Primary MD for the patient is [name], MD  LOS - 4   Chief Complaint  Patient presents with  . Altered Mental Status  . Bradycardia   Interim history 82 year old male admitted with sepsis secondary to HCAP. On antibiotics with little improvement.  Pending Ammonia level.  MRI and CT negative for acute abnormalities.  EEG was abnormal with slowing, finding nonspecific.   Assessment & Plan   Sepsis due to HCAP -Was hypothermic with leukocytosis upon admission -Now patient afebrile, leukocytosis trending downward -Chest x-Locher new right and worsening left basilar airspace opacities concerning for pna -Continue IV fluid, vancomycin, Zosyn -Strep pneumonia and legionella urine Ag negative  -Blood cultures no growth to date -Influenza negative  Acute encephalopathy and unresponsiveness -Likely secondary to sepsis -CT head was negative for acute cranial abnormalities -Continue neuro checks -MRI brain: chronic microvascular ischemia, stable remote lacunar infarcts of basal ganglia and thalami -EEG: abnormal with moderately severe generalized continuous slowing  -Pending ammonia -TSH WNL, B12: 1757  Essential hypertension -Blood pressures have been soft, amlodipine held  Severe protein calorie malnutrition -Pending improvement in patient's encephalopathy  History of CVA -Continue aspirin suppository  Acute on chronic kidney disease, stage III  -Baseline Cr <1, currently 1.42 -Likely secondary to sepsis -Continue IVF and monitor BMP -Renal US pending  Iron deficiency anemia -Baseline hemoglobin 9-10, currently 7.6 -Drop likely dilutional  -Will transfuse if Hb <7 -Anemia panel Iron 26  History of alcohol  abuse -Not on CIWA due to current state  Hypernatremia/Hyperchloremia -Continue D5W and continue to monitor BMP  Hypokalemia  -Resolved, continue to monitor BMP  Code Status: DNR  Family Communication: None at bedside. Spoke with sister/sonvia phone. Sister has POA.   Disposition Plan: Admitted. Pending improvement of mental status. Continue to monitor in stepdown  Time Spent in minutes   30 minutes  Procedures  EEG  Consults   None  DVT Prophylaxis  Lovenox  Lab Results  Component Value Date   PLT 220 07/16/2015    Medications  Scheduled Meds: . antiseptic oral rinse  7 mL Mouth Rinse BID  . aspirin  150 mg Rectal Daily  . Chlorhexidine Gluconate Cloth  6 each Topical Q0600  . enoxaparin (LOVENOX) injection  40 mg Subcutaneous Q24H  . mupirocin ointment  1 application Nasal BID  . piperacillin-tazobactam (ZOSYN)  IV  3.375 g Intravenous Q8H  . vancomycin  1,000 mg Intravenous Q24H   Continuous Infusions: . dextrose 75 mL/hr at 07/16/15 0226   PRN Meds:.ondansetron  Antibiotics    Anti-infectives    Start     Dose/Rate Route Frequency Ordered Stop   07/14/15 0500  vancomycin (VANCOCIN) IVPB 1000 mg/200 mL premix     1,000 mg 200 mL/hr over 60 Minutes Intravenous Every 24 hours 07/13/15 1223     07/13/15 0600  vancomycin (VANCOCIN) 500  mg in sodium chloride 0.9 % 100 mL IVPB  Status:  Discontinued     500 mg 100 mL/hr over 60 Minutes Intravenous Every 12 hours 07/12/15 1756 07/13/15 1223   07/13/15 0200  piperacillin-tazobactam (ZOSYN) IVPB 3.375 g     3.375 g 12.5 mL/hr over 240 Minutes Intravenous Every 8 hours 07/12/15 1756     07/12/15 1730  piperacillin-tazobactam (ZOSYN) IVPB 3.375 g     3.375 g 100 mL/hr over 30 Minutes Intravenous  Once 07/12/15 1719 07/12/15 1806   07/12/15 1730  vancomycin (VANCOCIN) IVPB 1000 mg/200 mL premix     1,000 mg 200 mL/hr over 60 Minutes Intravenous  Once 07/12/15 1719 07/12/15 1836      Subjective:   Raymond Medina  seen and examined today. Patient opens eyes, but not verbal.    Objective:   Filed Vitals:   07/15/15 2200 07/16/15 0000 07/16/15 0200 07/16/15 0400  BP: 154/72 141/58 141/58 148/57  Pulse: 70 62 66 58  Temp:  99.9 F (37.7 C)  99 F (37.2 C)  TempSrc:  Axillary  Axillary  Resp: 20 26 23 23   Height:      Weight:      SpO2:  100%  98%    Wt Readings from Last 3 Encounters:  07/13/15 65 kg (143 lb 4.8 oz)  07/10/15 58.514 kg (129 lb)  05/17/15 58.06 kg (128 lb)     Intake/Output Summary (Last 24 hours) at 07/16/15 1010 Last data filed at 07/16/15 0700  Gross per 24 hour  Intake   1795 ml  Output    550 ml  Net   1245 ml    Exam (no change)  General: Well developed, elderly, NAD  HEENT: NCAT, mucous membranes moist.   Cardiovascular: S1 S2 auscultated, RRR, no murmurs  Respiratory: Diminished breath sounds  Abdomen: Soft, nontender, nondistended, + bowel sounds  Extremities: warm dry without cyanosis clubbing  Neuro: Moves extremities sporadically  Skin: Several skin tears   Data Review   Micro Results Recent Results (from the past 240 hour(s))  Culture, blood (routine x 2)     Status: None (Preliminary result)   Collection Time: 07/12/15  5:00 PM  Result Value Ref Range Status   Specimen Description BLOOD RIGHT ARM  Final   Special Requests BOTTLES DRAWN AEROBIC AND ANAEROBIC 3CC  Final   Culture NO GROWTH 3 DAYS  Final   Report Status PENDING  Incomplete  Culture, blood (routine x 2)     Status: None (Preliminary result)   Collection Time: 07/12/15  6:43 PM  Result Value Ref Range Status   Specimen Description BLOOD RIGHT HAND  Final   Special Requests IN PEDIATRIC BOTTLE 2CC  Final   Culture NO GROWTH 2 DAYS  Final   Report Status PENDING  Incomplete  Urine culture     Status: None   Collection Time: 07/12/15  7:38 PM  Result Value Ref Range Status   Specimen Description URINE, CATHETERIZED  Final   Special Requests NONE  Final   Culture NO  GROWTH 1 DAY  Final   Report Status 07/14/2015 FINAL  Final  MRSA PCR Screening     Status: Abnormal   Collection Time: 07/13/15  1:22 AM  Result Value Ref Range Status   MRSA by PCR POSITIVE (A) NEGATIVE Final    Comment:        The GeneXpert MRSA Assay (FDA approved for NASAL specimens only), is one component of a comprehensive MRSA  colonization surveillance program. It is not intended to diagnose MRSA infection nor to guide or monitor treatment for MRSA infections. RESULT CALLED TO, READ BACK BY AND VERIFIED WITH: C [email or web address]  07/13/15 [facility]     Radiology Reports Dg Chest 2 View  07/10/2015  CLINICAL DATA:  Cough, congestion and chest pain for 3 weeks EXAM: CHEST  2 VIEW COMPARISON:  April 23, 2015 FINDINGS: The heart size and mediastinal contours are stable. The heart size is enlarged. The aorta is tortuous. There is mild atelectasis of left lung base. There is mild increased pulmonary interstitium bilaterally. There is no pulmonary edema or pleural effusion. The visualized skeletal structures are unremarkable. IMPRESSION: Mild enlarged heart with mild increased pulmonary interstitium bilaterally consistent with mild congestive heart failure. Mild atelectasis of left lung base. Electronically Signed   By: Abelardo Diesel M.D.   On: 07/10/2015 12:49   Ct Head Wo Contrast  07/12/2015  CLINICAL DATA:  Lethargy/altered mental status. History of prostate carcinoma. EXAM: CT HEAD WITHOUT CONTRAST TECHNIQUE: Contiguous axial images were obtained from the base of the skull through the vertex without intravenous contrast. COMPARISON:  April 23, 2015 FINDINGS: There is mild diffuse atrophy, stable. There is no intracranial mass, hemorrhage, extra-axial fluid collection, or midline shift there is patchy small vessel disease in the centra semiovale bilaterally. There are prior small lacunar infarcts in the superior medial left basal ganglia. There is small vessel disease in each thalamus.  There is also small vessel disease throughout much of the right external capsule, stable. No acute appearing infarct is evident. Bony calvarium appears intact. The mastoid air cells are clear. There are air-fluid levels in each maxillary antrum. No intraorbital lesions are apparent. IMPRESSION: Atrophy with small vessel disease and prior lacunar type infarcts as noted above. There is no intracranial mass, hemorrhage, or evidence of acute infarct. There are air-fluid levels in each maxillary antrum consistent with acute sinusitis bilaterally. Electronically Signed   By: Lowella Grip III M.D.   On: 07/12/2015 17:41   Mr Brain Wo Contrast  07/14/2015  CLINICAL DATA:  Acute encephalopathy. Failure to thrive. Personal history of prostate cancer. Altered mental status. EXAM: MRI HEAD WITHOUT CONTRAST TECHNIQUE: Multiplanar, multiecho pulse sequences of the brain and surrounding structures were obtained without intravenous contrast. COMPARISON:  CT head without contrast 07/12/2015. MRI brain 09/09/2010. FINDINGS: Moderate atrophy and diffuse white matter disease is similar the prior exam. There are remote lacunar infarcts in the basal ganglia and thalami bilaterally without significant interval change. The ventricles are proportionate to the degree of atrophy. White matter changes extend into the brainstem. The internal auditory canals are within normal limits bilaterally. Flow is present in the major intracranial arteries. Bilateral lens replacements are present. Fluid levels are present in the maxillary sinuses bilaterally. The remaining paranasal sinuses are clear. Bilateral mastoid effusions are noted. No obstructing nasopharyngeal lesion is evident. The skullbase is otherwise within normal limits. Sagittal images are unremarkable. IMPRESSION: 1. Similar appearance of moderate atrophy and diffuse white matter disease likely reflecting the sequela of chronic microvascular ischemia. 2. Stable remote lacunar  infarcts of the basal ganglia and thalami. Electronically Signed   By: San Morelle M.D.   On: 07/14/2015 14:07   Dg Chest Port 1 View  07/15/2015  CLINICAL DATA:  Pneumonia. EXAM: PORTABLE CHEST 1 VIEW COMPARISON:  07/12/2015 FINDINGS: The cardiomediastinal silhouette is unchanged allowing for patient rotation. Thoracic aortic calcification is noted. There is increasing, confluent airspace opacity in the left lung base. There  is also new confluent airspace opacity in the right lung base. No large pleural effusion or pneumothorax is identified. IMPRESSION: New right and worsening left basilar airspace opacities concerning for pneumonia. Electronically Signed   By: Logan Bores M.D.   On: 07/15/2015 14:00   Dg Chest Port 1 View  07/12/2015  CLINICAL DATA:  Lethargy and low O2 sats for 1 day. EXAM: PORTABLE CHEST 1 VIEW COMPARISON:  Chest x-rays dated 07/10/2015 and 04/23/2015. Comparison also made to chest CT dated 04/23/2015. FINDINGS: Today's study is somewhat limited by patient positioning and motion artifact. There appears to be a dense opacity at the left lung base which is new, suspicious for developing pneumonia. Right lung remains grossly clear throughout. Cardiomediastinal silhouette is stable in size and configuration. IMPRESSION: New dense opacity at the left lung base which is highly suspicious for a developing pneumonia. Study limitations detailed above. Electronically Signed   By: Franki Cabot M.D.   On: 07/12/2015 18:29    CBC  Recent Labs Lab 07/10/15 1153 07/12/15 1700 07/12/15 1834 07/13/15 0752 07/14/15 1018 07/15/15 0242 07/16/15 0222  WBC 11.7* 15.8*  --  22.8* 20.0* 16.8* 11.6*  HGB 9.1* 8.5* 7.5* 7.4* 8.0* 7.6* 7.6*  HCT 27.7* 26.0* 22.0* 22.5* 24.5* 23.1* 22.9*  PLT 283.0 248  --  232 230 234 220  MCV 96.8 94.2  --  95.3 95.7 95.5 95.8  MCH  --  30.8  --  31.4 31.3 31.4 31.8  MCHC 32.8 32.7  --  32.9 32.7 32.9 33.2  RDW 14.4 13.9  --  14.1 14.3 14.4 14.5   LYMPHSABS 1.1 0.4*  --   --   --   --   --   MONOABS 0.7 0.8  --   --   --   --   --   EOSABS 0.3 0.0  --   --   --   --   --   BASOSABS 0.0 0.0  --   --   --   --   --     Chemistries   Recent Labs Lab 07/10/15 1153 07/12/15 1700 07/12/15 1834 07/13/15 0752 07/14/15 0352 07/15/15 0242 07/16/15 0222  NA 140 143 144 146* 150* 147* 143  K 4.0 4.1 3.7 4.0 4.0 3.1* 3.7  CL 108 110 112* 118* 121* 118* 116*  CO2 24 22  --  16* 16* 18* 19*  GLUCOSE 110* 147* 158* 108* 83 109* 95  BUN 32* 51* 42* 45* 44* 38* 31*  CREATININE 1.08 1.46* 1.30* 1.49* 1.45* 1.42* 1.49*  CALCIUM 9.3 9.4  --  8.2* 8.7* 8.4* 8.4*  MG 2.3  --   --   --   --   --   --   AST 37 36  --   --   --   --   --   ALT 27 34  --   --   --   --   --   ALKPHOS 140* 124  --   --   --   --   --   BILITOT 0.7 0.6  --   --   --   --   --    ------------------------------------------------------------------------------------------------------------------ estimated creatinine clearance is 32.7 mL/min (by C-G formula based on Cr of 1.49). ------------------------------------------------------------------------------------------------------------------ No results for input(s): HGBA1C in the last 72 hours. ------------------------------------------------------------------------------------------------------------------ No results for input(s): CHOL, HDL, LDLCALC, TRIG, CHOLHDL, LDLDIRECT in the last 72 hours. ------------------------------------------------------------------------------------------------------------------  Recent Labs  07/15/15 1408  TSH 1.459   ------------------------------------------------------------------------------------------------------------------  Recent Labs  07/15/15 1408  VITAMINB12 1757*    Coagulation profile No results for input(s): INR, PROTIME in the last 168 hours.  No results for input(s): DDIMER in the last 72 hours.  Cardiac Enzymes No results for input(s): CKMB,  TROPONINI, MYOGLOBIN in the last 168 hours.  Invalid input(s): CK ------------------------------------------------------------------------------------------------------------------ Invalid input(s): POCBNP    Haidee Stogsdill D.O. on 07/16/2015 at 10:10 AM  Between 7am to 7pm - Pager - [phone]  After 7pm go to [email or web address] - password [email or web address]  And look for the night coverage person covering for me after hours  [facility] Office  [phone]

## 2015-07-16 NOTE — Progress Notes (Signed)
OT NOTE  Recommend mattress air overlay , prevalon boots bil LE due to heel wounds, frequent turns until mattress changed and palliative consult.   Raymond Medina   OTR/L Pager: [phone] Office: [phone] .

## 2015-07-16 NOTE — Care Management Important Message (Signed)
Important Message  Patient Details  Name: HEIDEN VANDERSLOOT MRN: [id number] Date of Birth: 11-27-1926   Medicare Important Message Given:  Yes    Shaneca Orne T, RN 07/16/2015, 2:10 PM

## 2015-07-16 NOTE — Plan of Care (Signed)
Problem: Acute Rehab PT Goals(only PT should resolve) Goal: Pt Will Go Supine/Side To Sit Verbal and tactile cues for initiation and sequencing.  Goal: Patient Will Transfer Sit To/From Stand Verbal and tactile cues for sequencing and initiation.  Goal: Pt Will Transfer Bed To Chair/Chair To Bed With verbal cues and tactile cues for initiation and sequencing.

## 2015-07-16 NOTE — Evaluation (Signed)
Physical Therapy Evaluation Patient Details Name: Raymond Medina MRN: [id number] DOB: 05/04/1927 Today's Date: 07/16/2015   History of Present Illness  Raymond Medina is a 82 y.o. male with PMH of hypertension, COPD, GERD, PVD, chronic neck pain, chronic back pain, prostate cancer (SS/P prostate radiation seeds 2000), CVA, chronic anemia, chronic kidney disease-stage III, alcohol abuse, who presents with unrespoonsiveness and generalized weakness.    Clinical Impression  Pt admitted with above diagnosis. Pt presents with functional limitations due to PT problem list (listed below). Pt was very lethargic and hard to arouse. Best response was sitting EOB but pt had minimal responses to commands with mostly localized response to noxious stimuli. Pt presents rigidity and is resistive to any movement. Speech is mostly unintelligible but responded mostly with "no" and "go away". Pt PLOF was very unclear, so spoke with his sister on the phone and she states that he has not walked or talked much since his last hospitalization in 05/2015. She states that he has no family support in the area and would like him to go somewhere close "[place]" which is a friend who checks on the pt. Pt would benefit from PT services at this time to increase functional mobility and to decrease functional limitations.      Follow Up Recommendations Other (comment);Supervision/Assistance - 24 hour (Palliative Care)    Equipment Recommendations  None recommended by PT    Recommendations for Other Services       Precautions / Restrictions Precautions Precautions: Fall Precaution Comments: watch O2 Restrictions Weight Bearing Restrictions: No      Mobility  Bed Mobility Overal bed mobility: Needs Assistance;+2 for physical assistance Bed Mobility: Supine to Sit;Sit to Supine     Supine to sit: Total assist;+2 for physical assistance;HOB elevated Sit to supine: Total assist;+2 for physical assistance   General bed  mobility comments: pt resistent and posterior leaning. pt pushing posterior inititally. Pt required tilt posteriorly and then anterior to help with arousal  Transfers Overall transfer level: Needs assistance Equipment used: None Transfers: Sit to/from Stand Sit to Stand: Total assist;+2 physical assistance;From elevated surface         General transfer comment: resistent, total (A) with no active participation to power up into standing  Ambulation/Gait                Stairs            Wheelchair Mobility    Modified Rankin (Stroke Patients Only)       Balance Overall balance assessment: Needs assistance Sitting-balance support: Feet supported;No upper extremity supported Sitting balance-Leahy Scale: Fair Sitting balance - Comments: Pt initally Max A to remain upright on EOB, but once back support was removed, pt had protective response to hold himself and from then on was MinGuard to sit EOB for ~10 minutes.  Postural control: Posterior lean Standing balance support: No upper extremity supported;During functional activity Standing balance-Leahy Scale: Zero Standing balance comment: Pt very resistive to standing movement.                              Pertinent Vitals/Pain Pain Assessment: Faces Faces Pain Scale: Hurts even more Pain Location: unknown Pain Descriptors / Indicators: Grimacing Pain Intervention(s): Monitored during session;Repositioned;Premedicated before session  Vitals stable throughout session. Pt on 2L O2 [place] throughout session.     Home Living Family/patient expects to be discharged to:: Skilled nursing facility  Additional Comments: PT student called sister via phone that lives in [place]. Fidel Levy, and she states that he was at a SNF for a month then home for a week with a 24 hour sitter. She said she had planned to take him home with her to [place], but she is 29 herself and cannot care for  him physically.  Also reported that son is undergoing treatment for CA at this time and unable to physical     Prior Function Level of Independence: Needs assistance   Gait / Transfers Assistance Needed: Has not walked since his last hospitalization. Max A for transfers.   ADL's / Homemaking Assistance Needed: total (A)   Comments: Unclear how much assistance he needed prior to admission.      Hand Dominance   Dominant Hand: Right    Extremity/Trunk Assessment   Upper Extremity Assessment: RUE deficits/detail;Generalized weakness;LUE deficits/detail           Lower Extremity Assessment: Defer to PT evaluation RLE Deficits / Details: Pt very resistive to movement in both directions but appears to be able to lift BLE's against gravity R>L LLE Deficits / Details: Pt very resistive to movement in all directions but appears to be able to lift BLE's against gravity. Did not move LLE as much as RLE.   Cervical / Trunk Assessment: Kyphotic  Communication   Communication: Other (comment) (mumbled speech)  Cognition Arousal/Alertness: Lethargic Behavior During Therapy: Agitated;Flat affect Overall Cognitive Status: No family/caregiver present to determine baseline cognitive functioning Area of Impairment: Attention   Current Attention Level:  (to aroused)           General Comments: pt only states "no" "what" "Go to [facility]" during session in response to tactile input and questions. pt with incr agitation with session and questions becoming more vocal and repetitve with "go to hell" Pt raising R hand as if to resist or guard against therapist but lack of visual attention / strength unable threaten therapist    General Comments General comments (skin integrity, edema, etc.): break down noted on R heel with discoloration to purple color, L heel worse with blister discoloration and weeping- foam dressings applied to bil heels Pt with edema noted R UE at elbow below IV access. L UE wrap  dressing with noted tightness and adjusted to prevent edema upper arm with foam dressing and active red bleeding noted on addressing. Recommending heels floated with Prevalon boots and need for frequent repositioning    Exercises        Assessment/Plan    PT Assessment Patient needs continued PT services  PT Diagnosis Generalized weakness;Altered mental status;Difficulty walking   PT Problem List Decreased strength;Decreased range of motion;Decreased activity tolerance;Decreased balance;Decreased mobility;Decreased coordination;Decreased cognition;Decreased knowledge of use of DME;Decreased safety awareness;Impaired tone;Decreased skin integrity  PT Treatment Interventions Functional mobility training;Therapeutic activities;Therapeutic exercise;Balance training;Neuromuscular re-education;Cognitive remediation;Patient/family education   PT Goals (Current goals can be found in the Care Plan section) Acute Rehab PT Goals Patient Stated Goal: "go to hell"  PT Goal Formulation: Patient unable to participate in goal setting Time For Goal Achievement: 07/30/15 Potential to Achieve Goals: Fair    Frequency Min 2X/week   Barriers to discharge Decreased caregiver support Pt has no family support in the area and has already been in SNF in the past 30 days.     Co-evaluation PT/OT/SLP Co-Evaluation/Treatment: Yes Reason for Co-Treatment: Complexity of the patient's impairments (multi-system involvement);Necessary to address cognition/behavior during functional activity;For patient/therapist safety PT goals addressed during  session: Mobility/safety with mobility;Balance OT goals addressed during session: ADL's and self-care;Strengthening/ROM       End of Session Equipment Utilized During Treatment: Oxygen;Gait belt Activity Tolerance: Treatment limited secondary to agitation;Patient limited by lethargy Patient left: in bed;with call bell/phone within reach;with bed alarm set;Other (comment)  (Hand mits re-applied) Nurse Communication: Mobility status;Other (comment) (Skin integrity observations)         Time: [id number] PT Time Calculation (min) (ACUTE ONLY): 40 min   Charges:   PT Evaluation $PT Eval Moderate Complexity: 1 Procedure PT Treatments $Self Care/Home Management: 8-22   PT G Codes:        [place] Aug 07, 2015, 12:26 PM

## 2015-07-16 NOTE — Evaluation (Signed)
Occupational Therapy Evaluation Patient Details Name: Raymond Medina MRN: [id number] DOB: [birth year] Today's Date: 07/16/2015    History of Present Illness Raymond Medina is a 82 y.o. male with PMH of hypertension, COPD, GERD, PVD, chronic neck pain, chronic back pain, prostate cancer (SS/P prostate radiation seeds 2000), CVA, chronic anemia, chronic kidney disease-stage III, alcohol abuse, who presents with unrespoonsiveness and generalized weakness.   Clinical Impression   PT admitted with unresponsive and generalized weakness. Pt currently with functional limitiations due to the deficits listed below (see OT problem list). PTA came from home with total (A) after recent d/c from snf on 07/08/15. Uncertain of full prior level of function due to [place] lives in [place] and has not seen patient. POA only has contact from phone calls. Pt will benefit from skilled OT to increase their independence and safety with adls and balance to allow discharge SNF.     Follow Up Recommendations  SNF;Supervision/Assistance - 24 hour    Equipment Recommendations  Wheelchair (measurements OT);Wheelchair cushion (measurements OT);Hospital bed (air mattress overlay)    Recommendations for Other Services  (palliative consult)     Precautions / Restrictions Precautions Precautions: Fall Precaution Comments: watch O2 Restrictions Weight Bearing Restrictions: No      Mobility Bed Mobility Overal bed mobility: Needs Assistance;+2 for physical assistance Bed Mobility: Supine to Sit;Sit to Supine     Supine to sit: Total assist;+2 for physical assistance;HOB elevated Sit to supine: Total assist;+2 for physical assistance   General bed mobility comments: pt resistent and posterior leaning. pt pushing posterior inititally. Pt required tilt posteriorly and then anterior to help with arousal  Transfers Overall transfer level: Needs assistance Equipment used: None Transfers: Sit to/from Stand Sit to Stand:  Total assist;+2 physical assistance;From elevated surface         General transfer comment: resistent, total (A) with no active participation to power up into standing    Balance Overall balance assessment: Needs assistance Sitting-balance support: Feet supported;No upper extremity supported Sitting balance-Leahy Scale: Fair Sitting balance - Comments: Pt initally Max A to remain upright on EOB, but once back support was removed, pt had protective response to hold himself and from then on was MinGuard to sit EOB for ~10 minutes.  Postural control: Posterior lean Standing balance support: No upper extremity supported;During functional activity Standing balance-Leahy Scale: Zero Standing balance comment: Pt very resistive to standing movement.                             ADL Overall ADL's : Needs assistance/impaired     Grooming: Wash/dry face;Minimal assistance;Sitting Grooming Details (indicate cue type and reason): attempting to raise R UE toward face             Lower Body Dressing: Total assistance                 General ADL Comments: pt required total (A) for all adls. Pt with incr risk for skin break down due to prolonged positioning and inability to change position without staff (A)      Vision     Perception     Praxis      Pertinent Vitals/Pain Pain Assessment: Faces Faces Pain Scale: Hurts even more Pain Location: unknown Pain Descriptors / Indicators: Grimacing Pain Intervention(s): Monitored during session;Repositioned;Premedicated before session     Hand Dominance Right   Extremity/Trunk Assessment Upper Extremity Assessment Upper Extremity Assessment: RUE deficits/detail;Generalized weakness;LUE deficits/detail  Lower Extremity Assessment Lower Extremity Assessment: Defer to PT evaluation RLE Deficits / Details: Pt very resistive to movement in both directions but appears to be able to lift BLE's against gravity R>L LLE  Deficits / Details: Pt very resistive to movement in all directions but appears to be able to lift BLE's against gravity. Did not move LLE as much as RLE.    Cervical / Trunk Assessment Cervical / Trunk Assessment: Kyphotic   Communication Communication Communication: Other (comment) (mumbled speech)   Cognition Arousal/Alertness: Lethargic Behavior During Therapy: Agitated;Flat affect Overall Cognitive Status: No family/caregiver present to determine baseline cognitive functioning Area of Impairment: Attention   Current Attention Level:  (to aroused)           General Comments: pt only states "no" "what" "Go to [facility]" during session in response to tactile input and questions. pt with incr agitation with session and questions becoming more vocal and repetitve with "go to hell" Pt raising R hand as if to resist or guard against therapist but lack of visual attention / strength unable threaten therapist   General Comments       Exercises       Shoulder Instructions      Home Living Family/patient expects to be discharged to:: Skilled nursing facility                                 Additional Comments: PT student called sister via phone that lives in [place]. Fidel Levy, and she states that he was at a SNF for a month then home for a week with a 24 hour sitter. She said she had planned to take him home with her to [place], but she is 33 herself and cannot care for him physically.  Also reported that son is undergoing treatment for CA at this time and unable to physical       Prior Functioning/Environment Level of Independence: Needs assistance  Gait / Transfers Assistance Needed: Has not walked since his last hospitalization. Max A for transfers.  ADL's / Homemaking Assistance Needed: total (A)    Comments: Unclear how much assistance he needed prior to admission.     OT Diagnosis: Generalized weakness;Cognitive deficits   OT Problem List:  Decreased strength;Decreased activity tolerance;Impaired balance (sitting and/or standing);Decreased safety awareness;Decreased knowledge of use of DME or AE;Decreased knowledge of precautions;Pain;Decreased cognition;Cardiopulmonary status limiting activity   OT Treatment/Interventions: Self-care/ADL training;Therapeutic exercise;DME and/or AE instruction;Therapeutic activities;Cognitive remediation/compensation;Visual/perceptual remediation/compensation;Patient/family education;Balance training    OT Goals(Current goals can be found in the care plan section) Acute Rehab OT Goals Patient Stated Goal: "go to hell"  OT Goal Formulation: Patient unable to participate in goal setting Time For Goal Achievement: 07/30/15 Potential to Achieve Goals: Fair  OT Frequency: Min 2X/week   Barriers to D/C: Decreased caregiver support  lives alone       Co-evaluation PT/OT/SLP Co-Evaluation/Treatment: Yes Reason for Co-Treatment: Complexity of the patient's impairments (multi-system involvement);Necessary to address cognition/behavior during functional activity;For patient/therapist safety PT goals addressed during session: Mobility/safety with mobility;Balance OT goals addressed during session: ADL's and self-care;Strengthening/ROM      End of Session Equipment Utilized During Treatment: Gait belt Nurse Communication: Mobility status;Precautions  Activity Tolerance: Patient tolerated treatment well Patient left: in bed;with call bell/phone within reach;with bed alarm set   Time: 1030-1059 OT Time Calculation (min): 29 min Charges:  OT General Charges $OT Visit: 1 Procedure OT Evaluation $OT Eval High  Complexity: 1 Procedure G-Codes:    Parke Poisson B 07/18/15, 11:59 AM  Jeri Modena   OTR/L Pager: [phone] Office: [phone] .

## 2015-07-16 NOTE — Progress Notes (Signed)
Pharmacy Antibiotic Note  Raymond Medina is a 82 y.o. male admitted on 07/12/2015 with sepsis from HCAP.  Pharmacy has been consulted for vancomycin and zosyn dosing.    Now day #5 of abx for sepsis 2nd HCAP. Recently DC home from rehab.  2/10 CXR: new opacity L lung base, ?PNA. Afebrile, WBC down to 11.6. SCr elevated but stable at 1.49, CrCl ~64ml/min.  Plan:  Continue vancomycin 1g IV  Q24 Continue Zosyn 3.375 gm IV q8h (4 hour infusion) Monitor clinical picture, renal function, VT prn F/U C&S, abx deescalation / LOT  Consider de-escalating therapy for remainder of 7 day course if possible  Height: 5\' 7"  (170.2 cm) Weight: 143 lb 4.8 oz (65 kg) IBW/kg (Calculated) : 66.1  Temp (24hrs), Avg:98.9 F (37.2 C), Min:96.7 F (35.9 C), Max:99.9 F (37.7 C)   Recent Labs Lab 07/12/15 1700 07/12/15 1834 07/12/15 1835 07/12/15 2030 07/13/15 0752 07/14/15 0352 07/14/15 1018 07/15/15 0242 07/16/15 0222  WBC 15.8*  --   --   --  22.8*  --  20.0* 16.8* 11.6*  CREATININE 1.46* 1.30*  --   --  1.49* 1.45*  --  1.42* 1.49*  LATICACIDVEN  --   --  0.51 1.07  --   --   --   --   --     Estimated Creatinine Clearance: 32.7 mL/min (by C-G formula based on Cr of 1.49).    Allergies  Allergen Reactions  . Budesonide-Formoterol Fumarate     REACTION: dizzy  . Hydrocodone-Acetaminophen     REACTION: prostate/ constipation  . Omeprazole     dizzy  . Plavix [Clopidogrel Bisulfate]     rash  . Ranitidine Hcl     REACTION: abd pain    Antimicrobials this admission: Vanc 2/10 >> Zosyn 2/10 >>  Dose adjustments this admission: 2/11 change vanc from 500 q12 to 1 gm q24  Microbiology results: MRSA PCR + S pnemo uag neg Flu neg 2/10 Ucx >> neg 2/10 bc >> ngtd  Thank you for allowing pharmacy to be a part of this patient's care.  Elenor Quinones, PharmD, BCPS Clinical Pharmacist Pager [phone] 07/16/2015 10:05 AM

## 2015-07-17 DIAGNOSIS — N179 Acute kidney failure, unspecified: Secondary | ICD-10-CM

## 2015-07-17 DIAGNOSIS — F1010 Alcohol abuse, uncomplicated: Secondary | ICD-10-CM

## 2015-07-17 DIAGNOSIS — G9340 Encephalopathy, unspecified: Secondary | ICD-10-CM

## 2015-07-17 DIAGNOSIS — N183 Chronic kidney disease, stage 3 (moderate): Secondary | ICD-10-CM

## 2015-07-17 DIAGNOSIS — Z789 Other specified health status: Secondary | ICD-10-CM

## 2015-07-17 DIAGNOSIS — J189 Pneumonia, unspecified organism: Secondary | ICD-10-CM

## 2015-07-17 LAB — CBC
HEMATOCRIT: 23.8 % — AB (ref 39.0–52.0)
Hemoglobin: 8.1 g/dL — ABNORMAL LOW (ref 13.0–17.0)
MCH: 32.1 pg (ref 26.0–34.0)
MCHC: 34 g/dL (ref 30.0–36.0)
MCV: 94.4 fL (ref 78.0–100.0)
PLATELETS: 195 10*3/uL (ref 150–400)
RBC: 2.52 MIL/uL — ABNORMAL LOW (ref 4.22–5.81)
RDW: 14.2 % (ref 11.5–15.5)
WBC: 11 10*3/uL — AB (ref 4.0–10.5)

## 2015-07-17 LAB — CULTURE, BLOOD (ROUTINE X 2): Culture: NO GROWTH

## 2015-07-17 LAB — BASIC METABOLIC PANEL
ANION GAP: 9 (ref 5–15)
BUN: 25 mg/dL — ABNORMAL HIGH (ref 6–20)
CALCIUM: 8.3 mg/dL — AB (ref 8.9–10.3)
CO2: 18 mmol/L — AB (ref 22–32)
CREATININE: 1.31 mg/dL — AB (ref 0.61–1.24)
Chloride: 111 mmol/L (ref 101–111)
GFR, EST AFRICAN AMERICAN: 55 mL/min — AB (ref >60)
GFR, EST NON AFRICAN AMERICAN: 48 mL/min — AB (ref >60)
Glucose, Bld: 90 mg/dL (ref 65–99)
Potassium: 3.4 mmol/L — ABNORMAL LOW (ref 3.5–5.1)
SODIUM: 138 mmol/L (ref 135–145)

## 2015-07-17 MED ORDER — POLYVINYL ALCOHOL 1.4 % OP SOLN
1.0000 [drp] | Freq: Four times a day (QID) | OPHTHALMIC | Status: DC | PRN
  Administered 2015-07-18: 1 [drp] via OPHTHALMIC
  Filled 2015-07-17 (×2): qty 15

## 2015-07-17 MED ORDER — ONDANSETRON 4 MG PO TBDP: 4.0000 mg | ORAL_TABLET | Freq: Four times a day (QID) | ORAL | Status: DC | PRN

## 2015-07-17 MED ORDER — HALOPERIDOL 0.5 MG PO TABS: 0.5000 mg | ORAL_TABLET | ORAL | Status: DC | PRN

## 2015-07-17 MED ORDER — ACETAMINOPHEN 325 MG PO TABS: 650.0000 mg | ORAL_TABLET | Freq: Four times a day (QID) | ORAL | Status: DC | PRN

## 2015-07-17 MED ORDER — MORPHINE SULFATE (CONCENTRATE) 10 MG/0.5ML PO SOLN: 5.0000 mg | ORAL | Status: DC | PRN

## 2015-07-17 MED ORDER — POTASSIUM CHLORIDE 10 MEQ/100ML IV SOLN
10.0000 meq | INTRAVENOUS | Status: AC
  Administered 2015-07-17 (×2): 10 meq via INTRAVENOUS
  Filled 2015-07-17 (×2): qty 100

## 2015-07-17 MED ORDER — BISACODYL 10 MG RE SUPP: 10.0000 mg | Freq: Every day | RECTAL | Status: DC | PRN

## 2015-07-17 MED ORDER — ONDANSETRON HCL 4 MG/2ML IJ SOLN: 4.0000 mg | Freq: Four times a day (QID) | INTRAMUSCULAR | Status: DC | PRN

## 2015-07-17 MED ORDER — LORAZEPAM 2 MG/ML PO CONC
1.0000 mg | ORAL | Status: DC | PRN
  Administered 2015-07-18: 1 mg via SUBLINGUAL
  Filled 2015-07-17: qty 1

## 2015-07-17 MED ORDER — HALOPERIDOL LACTATE 5 MG/ML IJ SOLN: 0.5000 mg | INTRAMUSCULAR | Status: DC | PRN

## 2015-07-17 MED ORDER — GLYCOPYRROLATE 0.2 MG/ML IJ SOLN
0.2000 mg | INTRAMUSCULAR | Status: DC | PRN
  Administered 2015-07-17 – 2015-07-18 (×2): 0.2 mg via INTRAVENOUS
  Filled 2015-07-17 (×3): qty 1

## 2015-07-17 MED ORDER — LORAZEPAM 1 MG PO TABS: 1.0000 mg | ORAL_TABLET | ORAL | Status: DC | PRN

## 2015-07-17 MED ORDER — GLYCOPYRROLATE 1 MG PO TABS
1.0000 mg | ORAL_TABLET | ORAL | Status: DC | PRN
  Filled 2015-07-17: qty 1

## 2015-07-17 MED ORDER — GLYCOPYRROLATE 0.2 MG/ML IJ SOLN: 0.2000 mg | INTRAMUSCULAR | Status: DC | PRN

## 2015-07-17 MED ORDER — SODIUM CHLORIDE 0.9 % IV SOLN: INTRAVENOUS | Status: DC

## 2015-07-17 MED ORDER — HYDROMORPHONE HCL 1 MG/ML IJ SOLN: 0.5000 mg | INTRAMUSCULAR | Status: DC | PRN

## 2015-07-17 MED ORDER — ACETAMINOPHEN 650 MG RE SUPP: 650.0000 mg | Freq: Four times a day (QID) | RECTAL | Status: DC | PRN

## 2015-07-17 MED ORDER — SODIUM CHLORIDE 0.9% FLUSH
3.0000 mL | Freq: Two times a day (BID) | INTRAVENOUS | Status: DC
  Administered 2015-07-17: 3 mL via INTRAVENOUS

## 2015-07-17 MED ORDER — BIOTENE DRY MOUTH MT LIQD: 15.0000 mL | OROMUCOSAL | Status: DC | PRN

## 2015-07-17 MED ORDER — GLYCOPYRROLATE 0.2 MG/ML IJ SOLN
0.2000 mg | INTRAMUSCULAR | Status: DC | PRN
  Filled 2015-07-17: qty 1

## 2015-07-17 MED ORDER — ONDANSETRON 4 MG PO TBDP
4.0000 mg | ORAL_TABLET | Freq: Four times a day (QID) | ORAL | Status: DC | PRN
  Filled 2015-07-17: qty 1

## 2015-07-17 MED ORDER — MORPHINE SULFATE (PF) 2 MG/ML IV SOLN
2.0000 mg | INTRAVENOUS | Status: DC | PRN
  Administered 2015-07-18 (×5): 2 mg via INTRAVENOUS
  Filled 2015-07-17 (×5): qty 1

## 2015-07-17 MED ORDER — HALOPERIDOL LACTATE 2 MG/ML PO CONC: 0.5000 mg | ORAL | Status: DC | PRN

## 2015-07-17 MED ORDER — GLYCOPYRROLATE 1 MG PO TABS: 1.0000 mg | ORAL_TABLET | ORAL | Status: DC | PRN

## 2015-07-17 MED ORDER — LORAZEPAM 2 MG/ML IJ SOLN
1.0000 mg | INTRAMUSCULAR | Status: DC | PRN
  Administered 2015-07-17 – 2015-07-18 (×2): 1 mg via INTRAVENOUS
  Filled 2015-07-17 (×2): qty 1

## 2015-07-17 MED ORDER — HALOPERIDOL 0.5 MG PO TABS
0.5000 mg | ORAL_TABLET | ORAL | Status: DC | PRN
  Filled 2015-07-17: qty 1

## 2015-07-17 MED ORDER — HALOPERIDOL LACTATE 2 MG/ML PO CONC
0.5000 mg | ORAL | Status: DC | PRN
  Filled 2015-07-17: qty 0.3

## 2015-07-17 NOTE — Evaluation (Signed)
Clinical/Bedside Swallow Evaluation Patient Details  Name: Raymond Medina MRN: [id number] Date of Birth: September 18, 1926  Today's Date: 07/17/2015 Time: SLP Start Time (ACUTE ONLY): [id number] SLP Stop Time (ACUTE ONLY): 0847 SLP Time Calculation (min) (ACUTE ONLY): 10 min  Past Medical History:  Past Medical History  Diagnosis Date  . Hypertension   . Low back pain   . Osteoarthritis   . Peripheral vascular disease ([place])   . Gout   . Giant cell arteritis ([place])   . COPD (chronic obstructive pulmonary disease) ([place])   . Allergic rhinitis   . GERD (gastroesophageal reflux disease)   . Tubulovillous adenoma polyp of colon     08/2001  . Hemorrhoids   . Radiation proctitis   . BPH (benign prostatic hyperplasia)   . Diverticulosis   . INSOMNIA, CHRONIC 06/14/2009  . HYPERTENSION 08/24/2007  . PERIPHERAL VASCULAR DISEASE 08/24/2007  . BRONCHITIS, ACUTE 08/03/2008  . ALLERGIC RHINITIS 09/05/2008  . COPD 08/08/2008  . GERD 12/07/2008  . CONSTIPATION 10/31/2009  . Actinic keratosis 06/04/2008  . OSTEOARTHRITIS 08/24/2007  . NECK PAIN 08/24/2007  . LOW BACK PAIN 08/24/2007  . WEIGHT LOSS 09/17/2009  . IMPAIRED GLUCOSE TOLERANCE 08/03/2008  . Contusion of forearm 09/17/2008  . 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 TOBACCO USE, QUIT 03/14/2009  . PROSTATE CANCER, HX OF 2000  . AVM (arteriovenous malformation)   . CVA (cerebral infarction) 09-09-10  . Prostate cancer ([place])   . CKD (chronic kidney disease), stage III    Past Surgical History:  Past Surgical History  Procedure Laterality Date  . Insertion prostate radiation seed      2000  . Cataract extraction, bilateral     HPI:  82 y.o. male with h/o HTN, COPD, GERD, PVD, chronic neck and back pain, CVA, chronic anemia, chronic kidney disease stage III and alcohol abuse. Pt presented to ED with AMS, unresponsiveness and generalized weakness and was recently discharged from SNF 2/6. MR Brain 2/12 similar appearance of moderate atrophy and diffuse white  matter disease likely reflecting sequela of chrnoic microvascular ischemia, stable remote lacunar infarcts of the basal ganglia and thalami. CXR 2/13 new right and worsening left basilar airspace opacities concerning for pneumonia. BSE 04/23/2015 revealed normal oropharyngeal swallow function, recommended regular diet and thin liquids.   Assessment / Plan / Recommendation Clinical Impression  Baseline O2 sats ranged 88-91%, consistent throat clearing due to likely pharyngeal secretions with inability to mobilize. Pt drowsy with dysarthric speech. Requierd max verbal cues/encouragment to participate and asking SLP to leave hin alone. No oral manipulations with ice chip and suctioned from oral cavity. Continue NPO with oral care. ST will check next date for safety to initiate po's.    Aspiration Risk  Severe aspiration risk    Diet Recommendation NPO   Medication Administration: Via alternative means    Other  Recommendations Oral Care Recommendations: Oral care QID   Follow up Recommendations   (TBD)    Frequency and Duration min 2x/week  2 weeks       Prognosis Prognosis for Safe Diet Advancement: Fair Barriers to Reach Goals: Cognitive deficits;Motivation      Swallow Study   General HPI: 82 y.o. male with h/o HTN, COPD, GERD, PVD, chronic neck and back pain, CVA, chronic anemia, chronic kidney disease stage III and alcohol abuse. Pt presented to ED with AMS, unresponsiveness and generalized weakness and was recently discharged from SNF 2/6. MR Brain 2/12 similar appearance of moderate atrophy and diffuse white  matter disease likely reflecting sequela of chrnoic microvascular ischemia, stable remote lacunar infarcts of the basal ganglia and thalami. CXR 2/13 new right and worsening left basilar airspace opacities concerning for pneumonia. BSE 04/23/2015 revealed normal oropharyngeal swallow function, recommended regular diet and thin liquids. Type of Study: Bedside Swallow  Evaluation Previous Swallow Assessment: see HPI Diet Prior to this Study: NPO Temperature Spikes Noted: Yes Respiratory Status: Room air History of Recent Intubation: No Behavior/Cognition: Uncooperative;Requires cueing Oral Cavity Assessment: Dry (llimited view) Oral Care Completed by SLP: Yes Oral Cavity - Dentition:  (unable to fully assess, some natural dentition?) Self-Feeding Abilities: Total assist Patient Positioning: Upright in bed Baseline Vocal Quality: Low vocal intensity;Hoarse Volitional Cough: Cognitively unable to elicit Volitional Swallow: Unable to elicit    Oral/Motor/Sensory Function Overall Oral Motor/Sensory Function:  (unable, no observable weakness)   Ice Chips Ice chips: Impaired Presentation: Spoon Oral Phase Impairments: Poor awareness of bolus Oral Phase Functional Implications:  (no manipulation) Pharyngeal Phase Impairments: Unable to trigger swallow   Thin Liquid Thin Liquid: Not tested    Nectar Thick Nectar Thick Liquid: Not tested   Honey Thick Honey Thick Liquid: Not tested   Puree Puree: Not tested   Solid   GO   Solid: Not tested        Houston Siren 07/17/2015,9:51 AM  Orbie Pyo Colvin Caroli[employer] [phone]

## 2015-07-17 NOTE — Clinical Social Work Note (Signed)
Clinical Social Work Assessment  Patient Details  Name: Raymond Medina MRN: [id number] Date of Birth: 02/27/1927  Date of referral:  07/17/15               Reason for consult:  End of Life/Hospice                Permission sought to share information with:  Family Supports Permission granted to share information::  Yes, Verbal Permission Granted  Name::     Pharmacist, community::  Hospice facilities  Relationship::  sister  Contact Information:     Housing/Transportation Living arrangements for the past 2 months:  Single Family Home, [place] of Information:  Other (Comment Required) (sister) Patient Interpreter Needed:  None Criminal Activity/Legal Involvement Pertinent to Current Situation/Hospitalization:  No - Comment as needed Significant Relationships:  Siblings Lives with:  Self Do you feel safe going back to the place where you live?  No Need for family participation in patient care:  Yes (Comment) (decision making)  Care giving concerns:  Pt lives alone- requiring almost total assist at this time   Facilities manager / plan:  Pt sister discussed recommendations for hospice with palliative team  Employment status:  Retired Forensic scientist:  Medicare PT Recommendations:  [place] / Referral to community resources:   (hospice)  Patient/Family's Response to care:  Sister is in agreement for hospice placement and does not want to prolong patient suffering  Patient/Family's Understanding of and Emotional Response to Diagnosis, Current Treatment, and Prognosis:  No questions or concerns at this time  Emotional Assessment Appearance:  Appears stated age Attitude/Demeanor/Rapport:  Unable to Assess Affect (typically observed):  Unable to Assess Orientation:  Fluctuating Orientation (Suspected and/or reported Sundowners) Alcohol / Substance use:  Alcohol Use Psych involvement (Current and /or in the community):      Discharge Needs  Concerns to be addressed:  Home Safety Concerns, Care Coordination Readmission within the last 30 days:  Yes Current discharge risk:  Physical Impairment, Terminally ill Barriers to Discharge:  Hospice Bed not available   Cranford Mon, LCSW 07/17/2015, 4:05 PM

## 2015-07-17 NOTE — Progress Notes (Addendum)
Consultation Note Date: 07/17/2015   Patient Name: Raymond Medina  DOB: Nov 07, 1926  MRN: [id number]  Age / Sex: 82 y.o., male  PCP: Raymond Anger, MD Referring Physician: [name], MD  Reason for Consultation: Establishing goals of care and Inpatient hospice referral  Attending Attestation: Patient seen and examined. I agree with details of the note below and have developed the care plan to include full comfort care. Prognosis is <2 weeks. He is appropriate for hospice facility referral.   Time: 35 minutes at bedside. 2PM-2:35PM  Greater than 50%  of this time was spent counseling and coordinating care related to the above assessment and plan.   Raymond Hacker, DO Palliative Medicine   Clinical Assessment/Narrative: 82 year old man from home. Had been living at home with neighbors helping get him to MD appointments and managing his home (mail, bills, cleaning, etc.). Medical hx includes HTN, COPD, GERD, chronic back pain, prostate CA, CVA, CKD stage III, alcohol abuse. Was found at home unresponsive and was subsequently brought to the ED. Admission dx sepsis secondary to HCAP, acute encephalopathy. Treatment was initiated at admit on 07/12/15. Patient's mental status and function have not returned to the baseline that was reported by his sister; he has no PO intake, he is responsive to pain and passive movement. He does moan during personal care, which was witnessed by the Palliative Medicine RN. Sister/POA Raymond Medina spoke with Palliative Medicine RN; she states that patient made her promise that she would not keep him alive artificially and that she would not prolong his dying. Based on that discussion with the patient, she requests that the patient be referred to inpatient hospice. Discussed case with Raymond Medina, Palliative Medicine, who agrees that prognosis is very short and that inpatient hospice is appropriate  for Raymond Medina. Notified SW Raymond Medina, who will begin referral process; sister has no preference for which unit, as she is in [name] and will not be visiting. Raymond. Hilma Medina gave orders for comfort care therapies.  Contacts/Participants in Discussion: Primary Decision Maker: Raymond Medina; home [phone]; cell [phone]. Patient's local caregivers are neighbors Raymond Medina and Raymond Medina [phone]); Raymond Medina would like them to be notified when d/c plan is complete so they can visit patient  HCPOA: Yes    SUMMARY OF RECOMMENDATIONS  Code Status/Advance Care Planning: DNR    Code Status Orders        Start     Ordered   07/13/15 0001  Do not attempt resuscitation (DNR)   Continuous    Question Answer Comment  In the event of cardiac or respiratory ARREST Do not call a "code blue"   In the event of cardiac or respiratory ARREST Do not perform Intubation, CPR, defibrillation or ACLS   In the event of cardiac or respiratory ARREST Use medication by any route, position, wound care, and other measures to relive pain and suffering. May use oxygen, suction and manual treatment of airway obstruction as needed for comfort.      07/13/15 0001    Code Status History    Date Active Date Inactive Code Status Order ID Comments User Context   07/12/2015 11:18 PM 07/13/2015 12:01 AM DNR [id number]  Raymond Lank, MD ED   04/23/2015  2:53 PM 04/26/2015  5:24 PM Full Code [id number]  Raymond Parr, NP Inpatient      Other Directives:None  Symptom Management:   Comfort care order set-Palliative  Morphine PRN for pain/dyspnea  Haldol/ativan for agitation  Palliative Prophylaxis:  Aspiration, Bowel Regimen, Delirium Protocol and Oral Care  Additional Recommendations (Limitations, Scope, Preferences):  Full Comfort Care   Psycho-social/Spiritual:  Support System: Adequate  Additional Recommendations: Education on Hospice  Prognosis: < 2 weeks  Discharge Planning: Hospice facility   Chief  Complaint/ Primary Diagnoses: Present on Admission:  . Essential hypertension . PERIPHERAL VASCULAR DISEASE . COPD (chronic obstructive pulmonary disease) ([place]) . GERD . CVA (cerebral infarction) . Anemia . Neuropathy of both feet ([place]) . LBP (low back pain) . Severe protein-calorie malnutrition ([place]) . HCAP (healthcare-associated pneumonia) . Sepsis ([place]) . Acute encephalopathy . Acute renal failure superimposed on stage 3 chronic kidney disease ([place]) . Alcohol abuse  I have reviewed the medical record, interviewed the patient and family, and examined the patient. The following aspects are pertinent.  Past Medical History  Diagnosis Date  . Hypertension   . Low back pain   . Osteoarthritis   . Peripheral vascular disease ([place])   . Gout   . Giant cell arteritis ([place])   . COPD (chronic obstructive pulmonary disease) ([place])   . Allergic rhinitis   . GERD (gastroesophageal reflux disease)   . Tubulovillous adenoma polyp of colon     08/2001  . Hemorrhoids   . Radiation proctitis   . BPH (benign prostatic hyperplasia)   . Diverticulosis   . INSOMNIA, CHRONIC 06/14/2009  . HYPERTENSION 08/24/2007  . PERIPHERAL VASCULAR DISEASE 08/24/2007  . BRONCHITIS, ACUTE 08/03/2008  . ALLERGIC RHINITIS 09/05/2008  . COPD 08/08/2008  . GERD 12/07/2008  . CONSTIPATION 10/31/2009  . Actinic keratosis 06/04/2008  . OSTEOARTHRITIS 08/24/2007  . NECK PAIN 08/24/2007  . LOW BACK PAIN 08/24/2007  . WEIGHT LOSS 09/17/2009  . IMPAIRED GLUCOSE TOLERANCE 08/03/2008  . Contusion of forearm 09/17/2008  . 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 TOBACCO USE, QUIT 03/14/2009  . PROSTATE CANCER, HX OF 2000  . AVM (arteriovenous malformation)   . CVA (cerebral infarction) 09-09-10  . Prostate cancer ([place])   . CKD (chronic kidney disease), stage III    Social History   Social History  . Marital Status: Widowed    Spouse Name: N/A  . Number of Children: N/A  . Years of Education: N/A   Social History Main  Topics  . Smoking status: Former Smoker    Types: Cigarettes    Quit date: 09/02/1970  . Smokeless tobacco: None  . Alcohol Use: 16.8 oz/week    28 Cans of beer per week     Comment: 2-3 drinks daily ( no more than 4 beers/d)  . Drug Use: No  . Sexual Activity: Not Currently   Other Topics Concern  . None   Social History Narrative   Retired    Former Smoker   Alcohol Use- yes 2-3 per day   Daily Caffeine use 4 per day   Illicit Drug use-no   Widow/Widower 2011   Family History  Problem Relation Age of Onset  . Stomach cancer Mother   . Hypertension Other   . Diabetes Other     Nephew  . Colon cancer Neg Hx    Scheduled Meds: . antiseptic oral rinse  7 mL Mouth Rinse BID  . aspirin  150 mg Rectal Daily  . Chlorhexidine Gluconate Cloth  6 each Topical Q0600  . enoxaparin (LOVENOX) injection  40 mg Subcutaneous Q24H  . mupirocin ointment  1 application Nasal BID  . piperacillin-tazobactam (ZOSYN)  IV  3.375 g Intravenous Q8H  . vancomycin  1,000 mg  Intravenous Q24H   Continuous Infusions: . dextrose 75 mL/hr at 07/17/15 0517   PRN Meds:.ondansetron Medications Prior to Admission:  Prior to Admission medications   Medication Sig Start Date End Date Taking? Authorizing Provider  amLODipine-valsartan (EXFORGE) 5-160 MG tablet TAKE 1 TABLET BY MOUTH EVERY DAY 03/26/15  Yes Aleksei Plotnikov V, MD  aspirin EC 81 MG tablet Take 1 tablet (81 mg total) by mouth daily. 02/19/14  Yes Aleksei Plotnikov V, MD  b complex vitamins tablet Take 1 tablet by mouth daily. 10/19/13  Yes Aleksei Plotnikov V, MD  celecoxib (CELEBREX) 100 MG capsule Take 1 capsule (100 mg total) by mouth 2 (two) times daily. 03/26/15  Yes Robyn Haber, MD  Cholecalciferol (VITAMIN D3) 1000 UNITS CAPS Take by mouth daily.     Yes Historical Provider, MD  dextromethorphan-guaiFENesin (MUCINEX DM) 30-600 MG 12hr tablet Take 1 tablet by mouth every 12 (twelve) hours as needed for cough.   Yes Historical  Provider, MD  feeding supplement, ENSURE ENLIVE, (ENSURE ENLIVE) LIQD Take 237 mLs by mouth 2 (two) times daily between meals. 04/26/15  Yes Albertine Patricia, MD  folic acid (FOLVITE) 1 MG tablet Take 1 tablet (1 mg total) by mouth daily. 04/26/15  Yes Albertine Patricia, MD  gabapentin (NEURONTIN) 100 MG capsule Take 100 mg by mouth at bedtime.   Yes Historical Provider, MD  Omega-3 Fatty Acids (FISH OIL) 1000 MG CPDR Take by mouth daily.     Yes Historical Provider, MD  polyethylene glycol powder (GLYCOLAX/MIRALAX) powder 1 SCOOP ONCE DAILY AS NEEDED FOR CONSTIPATION 05/20/11  Yes Aleksei Plotnikov V, MD  thiamine 100 MG tablet Take 1 tablet (100 mg total) by mouth daily. 04/26/15  Yes Albertine Patricia, MD  traZODone (DESYREL) 50 MG tablet Take 0.5-1 tablets (25-50 mg total) by mouth at bedtime as needed for sleep. 04/17/15  Yes Aleksei Plotnikov V, MD  triamcinolone cream (KENALOG) 0.5 % Apply topically 2 (two) times daily. 11/14/12  Yes Aleksei Plotnikov V, MD  ferrous sulfate 325 (65 FE) MG tablet Take 1 tablet (325 mg total) by mouth daily. Patient not taking: Reported on 07/12/2015 07/10/15   Raymond Anger, MD  Incontinence Supply Disposable (DEPEND OVERNIGHT BRIEFS MEDIUM) MISC Use bid 09/14/14   Lew Dawes V, MD  risperiDONE (RISPERDAL) 0.25 MG tablet Take 1 tablet (0.25 mg total) by mouth 2 (two) times daily. Patient taking differently: Take 0.25 mg by mouth at bedtime.  07/10/15   Raymond Anger, MD   Allergies  Allergen Reactions  . Budesonide-Formoterol Fumarate     REACTION: dizzy  . Hydrocodone-Acetaminophen     REACTION: prostate/ constipation  . Omeprazole     dizzy  . Plavix [Clopidogrel Bisulfate]     rash  . Ranitidine Hcl     REACTION: abd pain    Review of Systems  Physical Exam  Vital Signs: BP 104/86 mmHg  Pulse 65  Temp(Src) 98.4 F (36.9 C) (Axillary)  Resp 28  Ht 5\' 7"  (1.702 m)  Wt 65 kg (143 lb 4.8 oz)  SpO2 100%  SpO2: SpO2: 100  % O2 Device:SpO2: 100 % O2 Flow Rate: .O2 Flow Rate (L/min): 2 L/min  IO: Intake/output summary:  Intake/Output Summary (Last 24 hours) at 07/17/15 1515 Last data filed at 07/17/15 1422  Gross per 24 hour  Intake   2075 ml  Output   1350 ml  Net    725 ml    LBM: Last BM Date: 07/15/15 Baseline  Weight: Weight: 65 kg (143 lb 4.8 oz) Most recent weight: Weight: 65 kg (143 lb 4.8 oz)      Palliative Assessment/Data:    Additional Data Reviewed:  CBC:    Component Value Date/Time   WBC 11.0* 07/17/2015 0500   WBC 9.7 04/30/2015   HGB 8.1* 07/17/2015 0500   HCT 23.8* 07/17/2015 0500   PLT 195 07/17/2015 0500   MCV 94.4 07/17/2015 0500   NEUTROABS 14.6* 07/12/2015 1700   LYMPHSABS 0.4* 07/12/2015 1700   MONOABS 0.8 07/12/2015 1700   EOSABS 0.0 07/12/2015 1700   BASOSABS 0.0 07/12/2015 1700   Comprehensive Metabolic Panel:    Component Value Date/Time   NA 138 07/17/2015 0500   NA 141 04/30/2015   K 3.4* 07/17/2015 0500   CL 111 07/17/2015 0500   CO2 18* 07/17/2015 0500   BUN 25* 07/17/2015 0500   BUN 23* 04/30/2015   CREATININE 1.31* 07/17/2015 0500   CREATININE 0.9 04/30/2015   GLUCOSE 90 07/17/2015 0500   CALCIUM 8.3* 07/17/2015 0500   AST 36 07/12/2015 1700   ALT 34 07/12/2015 1700   ALKPHOS 124 07/12/2015 1700   BILITOT 0.6 07/12/2015 1700   PROT 6.0* 07/12/2015 1700   ALBUMIN 2.9* 07/12/2015 1700       Signed by: Larina Earthly, RN  Larina Earthly, RN  07/17/2015, 3:15 PM  Please contact Palliative Medicine Team phone at [phone] for questions and concerns.

## 2015-07-17 NOTE — Progress Notes (Signed)
CSW informed by palliative physician that patient will need residential hospice placement. Family does not have preference for facility as [place].  Referrals sent to:  [place], [place], and [facility].  Referrals under review- [facility] plans to follow up on referral tomorrow- no other responses at this time  CSW will continue to follow  Domenica Reamer, [place] Social Worker [phone]

## 2015-07-17 NOTE — Progress Notes (Signed)
[facility]                                                                              Patient Demographics  Raymond Medina, is a 82 y.o. male, DOB - 11-Apr-1927, [id number]  Admit date - 07/12/2015   Admitting Physician Ivor Costa, MD  Outpatient Primary MD for the patient is [name], MD  LOS - 5   Chief Complaint  Patient presents with  . Altered Mental Status  . Bradycardia       Brief HPI   82 year old male with hypertension, COPD, GERD, PVD, chronic back pain, prostate CA, CVA, CKD stage III, alcohol abuse presented with unresponsiveness and generalized weakness. Patient was found to have increased weakness, lethargy, decreased oral intake for several days at home by home health nurse, unresponsive on the day of admission. In ED, patient was hypotensive, hypothermic with temperature 31F, lactate 0.5, negative troponin, UA with positive UTI WBCs 15.8, worsening renal function. Chest x-Boedecker showed left basilar infiltrates. Patient was admitted with sepsis secondary to HCAP, acute encephalopathy. On antibiotics with little improvement. MRI and CT negative for acute abnormalities. EEG was abnormal with slowing, finding nonspecific.    Assessment & Plan   Sepsis due to HCAP -Was hypothermic with leukocytosis upon admission - Now patient afebrile, leukocytosis trending downward - Chest x-[place]fe new right and worsening left basilar airspace opacities concerning for pna - Swallow evaluation -> + aspiration, cont NPO - Continue gentle hydration, IV Vancomycin and Zosyn.  - Strep pneumonia and legionella urine Ag negative  - Blood cultures no growth to date - Influenza negative  Acute encephalopathy and unresponsiveness-Likely secondary to sepsis, likely has underlying dementia - Currently alert and oriented to self.  -CT head was negative for acute cranial abnormalities -Continue neuro checks -MRI brain: chronic microvascular ischemia, stable remote lacunar  infarcts of basal ganglia and thalami, moderate atrophy - EEG: abnormal with moderately severe generalized continuous slowing consistent with encephalopathy -Ammonia level 32 -TSH WNL, B12: 1757  Essential hypertension -Blood pressures have been soft, amlodipine held  Severe protein calorie malnutrition -Pending improvement in patient's encephalopathy, currently NPO  History of CVA -Continue aspirin suppository  Acute on chronic kidney disease, stage III : Likely secondary to sepsis -Baseline Cr <1, on admission  1.42, currently improving  -Continue IVF and monitor BMP -Renal US : No evidence of hydronephrosis, consistent with medical renal disease  Iron deficiency anemia -Baseline hemoglobin 9-10 - H&H stable for now, transfuse if Hb <7 - Anemia panel Iron 26  History of alcohol abuse -Not on CIWA due to current state  Hypernatremia/Hyperchloremia -Continue D5W and continue to monitor BMP, improving   Hypokalemia  -Replaced   Code Status: DO NOT RESUSCITATE  Family Communication: No family member at the bedside   Disposition Plan: continue to monitor in stepdown unit   Time Spent in minutes 25 minutes  Procedures  EEG  Consults   None   DVT Prophylaxis  Lovenox   Medications  Scheduled Meds: . antiseptic oral rinse  7 mL Mouth Rinse BID  . aspirin  150 mg Rectal Daily  . Chlorhexidine Gluconate  Cloth  6 each Topical [id number]  . enoxaparin (LOVENOX) injection  40 mg Subcutaneous Q24H  . mupirocin ointment  1 application Nasal BID  . piperacillin-tazobactam (ZOSYN)  IV  3.375 g Intravenous Q8H  . vancomycin  1,000 mg Intravenous Q24H   Continuous Infusions: . dextrose 75 mL/hr at 07/17/15 0517   PRN Meds:.ondansetron   Antibiotics   Anti-infectives    Start     Dose/Rate Route Frequency Ordered Stop   07/14/15 0500  vancomycin (VANCOCIN) IVPB 1000 mg/200 mL premix     1,000 mg 200 mL/hr over 60 Minutes Intravenous Every 24 hours 07/13/15 1223      07/13/15 0600  vancomycin (VANCOCIN) 500 mg in sodium chloride 0.9 % 100 mL IVPB  Status:  Discontinued     500 mg 100 mL/hr over 60 Minutes Intravenous Every 12 hours 07/12/15 1756 07/13/15 1223   07/13/15 0200  piperacillin-tazobactam (ZOSYN) IVPB 3.375 g     3.375 g 12.5 mL/hr over 240 Minutes Intravenous Every 8 hours 07/12/15 1756     07/12/15 1730  piperacillin-tazobactam (ZOSYN) IVPB 3.375 g     3.375 g 100 mL/hr over 30 Minutes Intravenous  Once 07/12/15 1719 07/12/15 1806   07/12/15 1730  vancomycin (VANCOCIN) IVPB 1000 mg/200 mL premix     1,000 mg 200 mL/hr over 60 Minutes Intravenous  Once 07/12/15 1719 07/12/15 1836        Subjective:   [name] was seen and examined today.  no fevers or chills, alert and oriented 1 to self.  difficult to obtain review of systems. Patient states no to most questions.  No acute events overnight.    Objective:   Filed Vitals:   07/17/15 0000 07/17/15 0400 07/17/15 0600 07/17/15 0919  BP: 114/59 86/67 100/50 99/70  Pulse: 76 71 72 67  Temp: 99 F (37.2 C) 99.1 F (37.3 C)  97.5 F (36.4 C)  TempSrc: Axillary Axillary  Axillary  Resp: 21 23 20 20   Height:      Weight:      SpO2: 95% 96%  94%    Intake/Output Summary (Last 24 hours) at 07/17/15 1111 Last data filed at 07/17/15 0600  Gross per 24 hour  Intake   2075 ml  Output    900 ml  Net   1175 ml     Wt Readings from Last 3 Encounters:  07/13/15 65 kg (143 lb 4.8 oz)  07/10/15 58.514 kg (129 lb)  05/17/15 58.06 kg (128 lb)     Exam  General: Alert and oriented x 1, NAD  HEENT:  PERRLA, EOMI, Anicteric Sclera, mucous membranes moist.   Neck: Supple, no JVD  CVS: S1 S2 auscultated, no rubs, murmurs or gallops. Regular rate and rhythm.  Respiratory: Diminished breath sounds at the bases  Abdomen: Soft, nontender, nondistended, + bowel sounds  Ext: no cyanosis clubbing or edema  Neuro: does not follow commands however moving all 4 extremities   Skin:  No rashes  Psych:  confused, oriented 1   Data Review   Micro Results Recent Results (from the past 240 hour(s))  Culture, blood (routine x 2)     Status: None (Preliminary result)   Collection Time: 07/12/15  5:00 PM  Result Value Ref Range Status   Specimen Description BLOOD RIGHT ARM  Final   Special Requests BOTTLES DRAWN AEROBIC AND ANAEROBIC 3CC  Final   Culture NO GROWTH 4 DAYS  Final   Report Status PENDING  Incomplete  Culture,  blood (routine x 2)     Status: None (Preliminary result)   Collection Time: 07/12/15  6:43 PM  Result Value Ref Range Status   Specimen Description BLOOD RIGHT HAND  Final   Special Requests IN PEDIATRIC BOTTLE 2CC  Final   Culture NO GROWTH 3 DAYS  Final   Report Status PENDING  Incomplete  Urine culture     Status: None   Collection Time: 07/12/15  7:38 PM  Result Value Ref Range Status   Specimen Description URINE, CATHETERIZED  Final   Special Requests NONE  Final   Culture NO GROWTH 1 DAY  Final   Report Status 07/14/2015 FINAL  Final  MRSA PCR Screening     Status: Abnormal   Collection Time: 07/13/15  1:22 AM  Result Value Ref Range Status   MRSA by PCR POSITIVE (A) NEGATIVE Final    Comment:        The GeneXpert MRSA Assay (FDA approved for NASAL specimens only), is one component of a comprehensive MRSA colonization surveillance program. It is not intended to diagnose MRSA infection nor to guide or monitor treatment for MRSA infections. RESULT CALLED TO, READ BACK BY AND VERIFIED WITH: C [email or web address]  07/13/15 [facility]     Radiology Reports Dg Chest 2 View  07/10/2015  CLINICAL DATA:  Cough, congestion and chest pain for 3 weeks EXAM: CHEST  2 VIEW COMPARISON:  April 23, 2015 FINDINGS: The heart size and mediastinal contours are stable. The heart size is enlarged. The aorta is tortuous. There is mild atelectasis of left lung base. There is mild increased pulmonary interstitium bilaterally. There is no pulmonary edema or  pleural effusion. The visualized skeletal structures are unremarkable. IMPRESSION: Mild enlarged heart with mild increased pulmonary interstitium bilaterally consistent with mild congestive heart failure. Mild atelectasis of left lung base. Electronically Signed   By: May 06, 2015 M.D.   On: 07/10/2015 12:49   Ct Head Wo Contrast  07/12/2015  CLINICAL DATA:  Lethargy/altered mental status. History of prostate carcinoma. EXAM: CT HEAD WITHOUT CONTRAST TECHNIQUE: Contiguous axial images were obtained from the base of the skull through the vertex without intravenous contrast. COMPARISON:  April 23, 2015 FINDINGS: There is mild diffuse atrophy, stable. There is no intracranial mass, hemorrhage, extra-axial fluid collection, or midline shift there is patchy small vessel disease in the centra semiovale bilaterally. There are prior small lacunar infarcts in the superior medial left basal ganglia. There is small vessel disease in each thalamus. There is also small vessel disease throughout much of the right external capsule, stable. No acute appearing infarct is evident. Bony calvarium appears intact. The mastoid air cells are clear. There are air-fluid levels in each maxillary antrum. No intraorbital lesions are apparent. IMPRESSION: Atrophy with small vessel disease and prior lacunar type infarcts as noted above. There is no intracranial mass, hemorrhage, or evidence of acute infarct. There are air-fluid levels in each maxillary antrum consistent with acute sinusitis bilaterally. Electronically Signed   By: May 06, 2015 III M.D.   On: 07/12/2015 17:41   Mr Brain Wo Contrast  07/14/2015  CLINICAL DATA:  Acute encephalopathy. Failure to thrive. Personal history of prostate cancer. Altered mental status. EXAM: MRI HEAD WITHOUT CONTRAST TECHNIQUE: Multiplanar, multiecho pulse sequences of the brain and surrounding structures were obtained without intravenous contrast. COMPARISON:  CT head without contrast  07/12/2015. MRI brain 09/09/2010. FINDINGS: Moderate atrophy and diffuse white matter disease is similar the prior exam. There are remote lacunar infarcts in the basal  ganglia and thalami bilaterally without significant interval change. The ventricles are proportionate to the degree of atrophy. White matter changes extend into the brainstem. The internal auditory canals are within normal limits bilaterally. Flow is present in the major intracranial arteries. Bilateral lens replacements are present. Fluid levels are present in the maxillary sinuses bilaterally. The remaining paranasal sinuses are clear. Bilateral mastoid effusions are noted. No obstructing nasopharyngeal lesion is evident. The skullbase is otherwise within normal limits. Sagittal images are unremarkable. IMPRESSION: 1. Similar appearance of moderate atrophy and diffuse white matter disease likely reflecting the sequela of chronic microvascular ischemia. 2. Stable remote lacunar infarcts of the basal ganglia and thalami. Electronically Signed   By: San Morelle M.D.   On: 07/14/2015 14:07   US Renal  07/16/2015  CLINICAL DATA:  82 year old with personal history of prostate cancer, presenting with acute renal insufficiency superimposed upon stage 3 chronic kidney disease. EXAM: RENAL / URINARY TRACT ULTRASOUND COMPLETE COMPARISON:  CT chest, abdomen and pelvis 11/20 07/1014. FINDINGS: Right Kidney: Length: Approximately 10.4 cm. No hydronephrosis. Oval macrolobular anechoic mass with acoustic enhancement arising from the mid to lower pole measuring approximately 3.5 x 2.5 x 2.8 cm. No solid parenchymal mass. Well-preserved cortex for age. Mildly echogenic parenchyma. Left Kidney: Length: Approximately 10.6 cm. No hydronephrosis. No focal parenchymal abnormalities. Well-preserved cortex for age. Mildly echogenic parenchyma. Bladder: Normal for degree of bladder distention. Other: Small bilateral pleural effusions. IMPRESSION: 1. No evidence of  hydronephrosis involving either kidney to suggest obstruction as a cause for renal insufficiency. 2. Mildly echogenic kidneys consistent with medical renal disease. 3. Benign simple cyst arising from the mid to lower pole of the right kidney as noted on the prior CT. 4. Small bilateral pleural effusions noted. Electronically Signed   By: Evangeline Dakin M.D.   On: 07/16/2015 10:53   Dg Chest Port 1 View  07/15/2015  CLINICAL DATA:  Pneumonia. EXAM: PORTABLE CHEST 1 VIEW COMPARISON:  07/12/2015 FINDINGS: The cardiomediastinal silhouette is unchanged allowing for patient rotation. Thoracic aortic calcification is noted. There is increasing, confluent airspace opacity in the left lung base. There is also new confluent airspace opacity in the right lung base. No large pleural effusion or pneumothorax is identified. IMPRESSION: New right and worsening left basilar airspace opacities concerning for pneumonia. Electronically Signed   By: Logan Bores M.D.   On: 07/15/2015 14:00   Dg Chest Port 1 View  07/12/2015  CLINICAL DATA:  Lethargy and low O2 sats for 1 day. EXAM: PORTABLE CHEST 1 VIEW COMPARISON:  Chest x-rays dated 07/10/2015 and 04/23/2015. Comparison also made to chest CT dated 04/23/2015. FINDINGS: Today's study is somewhat limited by patient positioning and motion artifact. There appears to be a dense opacity at the left lung base which is new, suspicious for developing pneumonia. Right lung remains grossly clear throughout. Cardiomediastinal silhouette is stable in size and configuration. IMPRESSION: New dense opacity at the left lung base which is highly suspicious for a developing pneumonia. Study limitations detailed above. Electronically Signed   By: Franki Cabot M.D.   On: 07/12/2015 18:29    CBC  Recent Labs Lab 07/10/15 1153  07/12/15 1700  07/13/15 0752 07/14/15 1018 07/15/15 0242 07/16/15 0222 07/17/15 0500  WBC 11.7*  --  15.8*  --  22.8* 20.0* 16.8* 11.6* 11.0*  HGB 9.1*  --   8.5*  < > 7.4* 8.0* 7.6* 7.6* 8.1*  HCT 27.7*  --  26.0*  < > 22.5* 24.5* 23.1* 22.9*  23.8*  PLT 283.0  --  248  --  232 230 234 220 195  MCV 96.8  --  94.2  --  95.3 95.7 95.5 95.8 94.4  MCH  --   < > 30.8  --  31.4 31.3 31.4 31.8 32.1  MCHC 32.8  --  32.7  --  32.9 32.7 32.9 33.2 34.0  RDW 14.4  --  13.9  --  14.1 14.3 14.4 14.5 14.2  LYMPHSABS 1.1  --  0.4*  --   --   --   --   --   --   MONOABS 0.7  --  0.8  --   --   --   --   --   --   EOSABS 0.3  --  0.0  --   --   --   --   --   --   BASOSABS 0.0  --  0.0  --   --   --   --   --   --   < > = values in this interval not displayed.  Chemistries   Recent Labs Lab 07/10/15 1153 07/12/15 1700  07/13/15 0752 07/14/15 0352 07/15/15 0242 07/16/15 0222 07/17/15 0500  NA 140 143  < > 146* 150* 147* 143 138  K 4.0 4.1  < > 4.0 4.0 3.1* 3.7 3.4*  CL 108 110  < > 118* 121* 118* 116* 111  CO2 24 22  --  16* 16* 18* 19* 18*  GLUCOSE 110* 147*  < > 108* 83 109* 95 90  BUN 32* 51*  < > 45* 44* 38* 31* 25*  CREATININE 1.08 1.46*  < > 1.49* 1.45* 1.42* 1.49* 1.31*  CALCIUM 9.3 9.4  --  8.2* 8.7* 8.4* 8.4* 8.3*  MG 2.3  --   --   --   --   --   --   --   AST 37 36  --   --   --   --   --   --   ALT 27 34  --   --   --   --   --   --   ALKPHOS 140* 124  --   --   --   --   --   --   BILITOT 0.7 0.6  --   --   --   --   --   --   < > = values in this interval not displayed. ------------------------------------------------------------------------------------------------------------------ estimated creatinine clearance is 37.2 mL/min (by C-G formula based on Cr of 1.31). ------------------------------------------------------------------------------------------------------------------ No results for input(s): HGBA1C in the last 72 hours. ------------------------------------------------------------------------------------------------------------------ No results for input(s): CHOL, HDL, LDLCALC, TRIG, CHOLHDL, LDLDIRECT in the last 72  hours. ------------------------------------------------------------------------------------------------------------------  Recent Labs  07/15/15 1408  TSH 1.459   ------------------------------------------------------------------------------------------------------------------  Recent Labs  07/15/15 1408  VITAMINB12 1757*    Coagulation profile No results for input(s): INR, PROTIME in the last 168 hours.  No results for input(s): DDIMER in the last 72 hours.  Cardiac Enzymes No results for input(s): CKMB, TROPONINI, MYOGLOBIN in the last 168 hours.  Invalid input(s): CK ------------------------------------------------------------------------------------------------------------------ Invalid input(s): POCBNP   Recent Labs  07/14/15 1552 07/15/15 2006  GLUCAP 107* 86     RAI,RIPUDEEP M.D. [facility] 07/17/2015, 11:11 AM  Pager: [phone] Between 7am to 7pm - call Pager - [phone]  After 7pm go to [email or web address] - password [email or web address]  Call night coverage person covering after 7pm

## 2015-07-18 LAB — CULTURE, BLOOD (ROUTINE X 2): CULTURE: NO GROWTH

## 2015-07-18 MED ORDER — GLYCOPYRROLATE 1 MG PO TABS: 1.0000 mg | ORAL_TABLET | ORAL | Status: AC | PRN

## 2015-07-18 MED ORDER — LORAZEPAM 1 MG PO TABS: 1.0000 mg | ORAL_TABLET | ORAL | Status: AC | PRN

## 2015-07-18 MED ORDER — MORPHINE SULFATE (CONCENTRATE) 10 MG/0.5ML PO SOLN: 5.0000 mg | ORAL | Status: AC | PRN

## 2015-07-18 MED ORDER — POLYVINYL ALCOHOL 1.4 % OP SOLN: 1.0000 [drp] | Freq: Four times a day (QID) | OPHTHALMIC | Status: AC | PRN

## 2015-07-18 MED ORDER — BISACODYL 10 MG RE SUPP: 10.0000 mg | Freq: Every day | RECTAL | Status: AC | PRN

## 2015-07-18 NOTE — Progress Notes (Signed)
Patient will discharge to [facility] [place], [place]) Anticipated discharge date: 07/18/15 Family notified: pt sister, neighbors Transportation by [employer]- scheduled at Kevin signing off.  Domenica Reamer, [place] Social Worker [phone]

## 2015-07-18 NOTE — Progress Notes (Signed)
[facility]                                                                              Patient Demographics  Raymond Medina, is a 82 y.o. male, DOB - 09-01-1926, [id number]  Admit date - 07/12/2015   Admitting Physician Ivor Costa, MD  Outpatient Primary MD for the patient is [name], MD  LOS - 6   Chief Complaint  Patient presents with  . Altered Mental Status  . Bradycardia       Brief HPI   82 year old male with hypertension, COPD, GERD, PVD, chronic back pain, prostate CA, CVA, CKD stage III, alcohol abuse presented with unresponsiveness and generalized weakness. Patient was found to have increased weakness, lethargy, decreased oral intake for several days at home by home health nurse, unresponsive on the day of admission. In ED, patient was hypotensive, hypothermic with temperature 94F, lactate 0.5, negative troponin, UA with positive UTI WBCs 15.8, worsening renal function. Chest x-Reasoner showed left basilar infiltrates. Patient was admitted with sepsis secondary to HCAP, acute encephalopathy. On antibiotics with little improvement. MRI and CT negative for acute abnormalities. EEG was abnormal with slowing, finding nonspecific.    Assessment & Plan   Sepsis due to HCAP -Was hypothermic with leukocytosis upon admission - Chest x-Mallet new right and worsening left basilar airspace opacities concerning for pna - Swallow evaluation -> + aspiration, cont NPO - Continue gentle hydration, IV Vancomycin and Zosyn.  - Strep pneumonia and legionella urine Ag negative  - Blood cultures no growth to date - Influenza negative - Palliative care consulted, placed on comfort care status after discussion with patient's family/sister POA. Overall poor prognosis. Social work consulted for residential hospice   Acute encephalopathy and unresponsiveness-Likely secondary to sepsis, likely has underlying dementia -CT head was negative for acute cranial abnormalities -MRI  brain: chronic microvascular ischemia, stable remote lacunar infarcts of basal ganglia and thalami, moderate atrophy - EEG: abnormal with moderately severe generalized continuous slowing consistent with encephalopathy -Ammonia level 32 -TSH WNL, B12: 1757 - Comfort Care status   Essential hypertension -Blood pressuresBorderline  Severe protein calorie malnutrition -Pending improvement in patient's encephalopathy, currently NPO  History of CVA -Continue aspirin suppository  Acute on chronic kidney disease, stage III : Likely secondary to sepsis -Baseline Cr <1, on admission  1.42,  no further labs due to comfort care status  -Renal US : No evidence of hydronephrosis, consistent with medical renal disease  Iron deficiency anemia -Baseline hemoglobin 9-10, no further labs due to comfort care status  - Anemia panel Iron 26  History of alcohol abuse -Not on CIWA due to current state  Hypernatremia/Hyperchloremia -Continue D5W and continue to monitor BMP, improving   Hypokalemia  -Replaced   Code Status: DO NOT RESUSCITATE  Family Communication: No family member at the bedside   Disposition Plan:  transfer to [place] unit, awaiting residential hospice   Time Spent in minutes 25 minutes  Procedures  EEG  Consults Palliative medicine  DVT Prophylaxis  Lovenox   Medications  Scheduled Meds: . antiseptic oral rinse  7 mL Mouth Rinse BID  . Chlorhexidine  Gluconate Cloth  6 each Topical [id number]  . mupirocin ointment  1 application Nasal BID  . sodium chloride flush  3 mL Intravenous Q12H   Continuous Infusions:   PRN Meds:.acetaminophen **OR** acetaminophen, antiseptic oral rinse, bisacodyl, glycopyrrolate **OR** glycopyrrolate **OR** glycopyrrolate, haloperidol **OR** haloperidol **OR** haloperidol lactate, HYDROmorphone (DILAUDID) injection, LORazepam **OR** LORazepam **OR** LORazepam, morphine injection, morphine CONCENTRATE **OR** morphine CONCENTRATE, ondansetron  **OR** ondansetron (ZOFRAN) IV, polyvinyl alcohol   Antibiotics   Anti-infectives    Start     Dose/Rate Route Frequency Ordered Stop   07/14/15 0500  vancomycin (VANCOCIN) IVPB 1000 mg/200 mL premix  Status:  Discontinued     1,000 mg 200 mL/hr over 60 Minutes Intravenous Every 24 hours 07/13/15 1223 07/17/15 1552   07/13/15 0600  vancomycin (VANCOCIN) 500 mg in sodium chloride 0.9 % 100 mL IVPB  Status:  Discontinued     500 mg 100 mL/hr over 60 Minutes Intravenous Every 12 hours 07/12/15 1756 07/13/15 1223   07/13/15 0200  piperacillin-tazobactam (ZOSYN) IVPB 3.375 g  Status:  Discontinued     3.375 g 12.5 mL/hr over 240 Minutes Intravenous Every 8 hours 07/12/15 1756 07/17/15 1552   07/12/15 1730  piperacillin-tazobactam (ZOSYN) IVPB 3.375 g     3.375 g 100 mL/hr over 30 Minutes Intravenous  Once 07/12/15 1719 07/12/15 1806   07/12/15 1730  vancomycin (VANCOCIN) IVPB 1000 mg/200 mL premix     1,000 mg 200 mL/hr over 60 Minutes Intravenous  Once 07/12/15 1719 07/12/15 1836        Subjective:   [name] was seen and examined today.  comfortable, opens eyes but no meaningful responses to questions.  No acute events overnight.  Comfort Care status   Objective:   Filed Vitals:   07/18/15 0000 07/18/15 0400 07/18/15 0700 07/18/15 0939  BP: 80/30 105/36 99/40 93/45   Pulse: 72 68 74 73  Temp:  98.3 F (36.8 C) 98.7 F (37.1 C)   TempSrc:  Axillary Axillary   Resp: 20 21 18 21   Height:      Weight:  65 kg (143 lb 4.8 oz)    SpO2:  95% 95% 87%    Intake/Output Summary (Last 24 hours) at 07/18/15 1054 Last data filed at 07/18/15 1020  Gross per 24 hour  Intake   1053 ml  Output   1325 ml  Net   -272 ml     Wt Readings from Last 3 Encounters:  07/18/15 65 kg (143 lb 4.8 oz)  07/10/15 58.514 kg (129 lb)  05/17/15 58.06 kg (128 lb)     Exam  General: somewhat sedated and lethargic, opens eyes spontaneously   HEENT: not assessed    NeNot assessed   CVS: S1  S2 auscultated, no rubs, murmurs or gallops. Regular rate and rhythm.  Respiratory: Diminished breath sounds at the bases  Abdomen: Soft, nontender, nondistended, + bowel sounds  Ext: no cyanosis clubbing or edema  Neuro:  somewhat sedated, lethargic, not following commands  Skin: foam dressings on both lower extremities  Psych:  stated    Data Review   Micro Results Recent Results (from the past 240 hour(s))  Culture, blood (routine x 2)     Status: None   Collection Time: 07/12/15  5:00 PM  Result Value Ref Range Status   Specimen Description BLOOD RIGHT ARM  Final   Special Requests BOTTLES DRAWN AEROBIC AND ANAEROBIC 3CC  Final   Culture NO GROWTH 5 DAYS  Final   Report  Status 07/17/2015 FINAL  Final  Culture, blood (routine x 2)     Status: None   Collection Time: 07/12/15  6:43 PM  Result Value Ref Range Status   Specimen Description BLOOD RIGHT HAND  Final   Special Requests IN PEDIATRIC BOTTLE 2CC  Final   Culture NO GROWTH 5 DAYS  Final   Report Status 07/18/2015 FINAL  Final  Urine culture     Status: None   Collection Time: 07/12/15  7:38 PM  Result Value Ref Range Status   Specimen Description URINE, CATHETERIZED  Final   Special Requests NONE  Final   Culture NO GROWTH 1 DAY  Final   Report Status 07/14/2015 FINAL  Final  MRSA PCR Screening     Status: Abnormal   Collection Time: 07/13/15  1:22 AM  Result Value Ref Range Status   MRSA by PCR POSITIVE (A) NEGATIVE Final    Comment:        The GeneXpert MRSA Assay (FDA approved for NASAL specimens only), is one component of a comprehensive MRSA colonization surveillance program. It is not intended to diagnose MRSA infection nor to guide or monitor treatment for MRSA infections. RESULT CALLED TO, READ BACK BY AND VERIFIED WITH: C [email or web address]  07/13/15 [facility]     Radiology Reports Dg Chest 2 View  07/10/2015  CLINICAL DATA:  Cough, congestion and chest pain for 3 weeks EXAM: CHEST  2 VIEW  COMPARISON:  April 23, 2015 FINDINGS: The heart size and mediastinal contours are stable. The heart size is enlarged. The aorta is tortuous. There is mild atelectasis of left lung base. There is mild increased pulmonary interstitium bilaterally. There is no pulmonary edema or pleural effusion. The visualized skeletal structures are unremarkable. IMPRESSION: Mild enlarged heart with mild increased pulmonary interstitium bilaterally consistent with mild congestive heart failure. Mild atelectasis of left lung base. Electronically Signed   By: May 06, 2015 M.D.   On: 07/10/2015 12:49   Ct Head Wo Contrast  07/12/2015  CLINICAL DATA:  Lethargy/altered mental status. History of prostate carcinoma. EXAM: CT HEAD WITHOUT CONTRAST TECHNIQUE: Contiguous axial images were obtained from the base of the skull through the vertex without intravenous contrast. COMPARISON:  April 23, 2015 FINDINGS: There is mild diffuse atrophy, stable. There is no intracranial mass, hemorrhage, extra-axial fluid collection, or midline shift there is patchy small vessel disease in the centra semiovale bilaterally. There are prior small lacunar infarcts in the superior medial left basal ganglia. There is small vessel disease in each thalamus. There is also small vessel disease throughout much of the right external capsule, stable. No acute appearing infarct is evident. Bony calvarium appears intact. The mastoid air cells are clear. There are air-fluid levels in each maxillary antrum. No intraorbital lesions are apparent. IMPRESSION: Atrophy with small vessel disease and prior lacunar type infarcts as noted above. There is no intracranial mass, hemorrhage, or evidence of acute infarct. There are air-fluid levels in each maxillary antrum consistent with acute sinusitis bilaterally. Electronically Signed   By: May 06, 2015 III M.D.   On: 07/12/2015 17:41   Mr Brain Wo Contrast  07/14/2015  CLINICAL DATA:  Acute encephalopathy. Failure  to thrive. Personal history of prostate cancer. Altered mental status. EXAM: MRI HEAD WITHOUT CONTRAST TECHNIQUE: Multiplanar, multiecho pulse sequences of the brain and surrounding structures were obtained without intravenous contrast. COMPARISON:  CT head without contrast 07/12/2015. MRI brain 09/09/2010. FINDINGS: Moderate atrophy and diffuse white matter disease is similar the prior exam. There are  remote lacunar infarcts in the basal ganglia and thalami bilaterally without significant interval change. The ventricles are proportionate to the degree of atrophy. White matter changes extend into the brainstem. The internal auditory canals are within normal limits bilaterally. Flow is present in the major intracranial arteries. Bilateral lens replacements are present. Fluid levels are present in the maxillary sinuses bilaterally. The remaining paranasal sinuses are clear. Bilateral mastoid effusions are noted. No obstructing nasopharyngeal lesion is evident. The skullbase is otherwise within normal limits. Sagittal images are unremarkable. IMPRESSION: 1. Similar appearance of moderate atrophy and diffuse white matter disease likely reflecting the sequela of chronic microvascular ischemia. 2. Stable remote lacunar infarcts of the basal ganglia and thalami. Electronically Signed   By: San Morelle M.D.   On: 07/14/2015 14:07   US Renal  07/16/2015  CLINICAL DATA:  82 year old with personal history of prostate cancer, presenting with acute renal insufficiency superimposed upon stage 3 chronic kidney disease. EXAM: RENAL / URINARY TRACT ULTRASOUND COMPLETE COMPARISON:  CT chest, abdomen and pelvis 11/20 07/1014. FINDINGS: Right Kidney: Length: Approximately 10.4 cm. No hydronephrosis. Oval macrolobular anechoic mass with acoustic enhancement arising from the mid to lower pole measuring approximately 3.5 x 2.5 x 2.8 cm. No solid parenchymal mass. Well-preserved cortex for age. Mildly echogenic parenchyma. Left  Kidney: Length: Approximately 10.6 cm. No hydronephrosis. No focal parenchymal abnormalities. Well-preserved cortex for age. Mildly echogenic parenchyma. Bladder: Normal for degree of bladder distention. Other: Small bilateral pleural effusions. IMPRESSION: 1. No evidence of hydronephrosis involving either kidney to suggest obstruction as a cause for renal insufficiency. 2. Mildly echogenic kidneys consistent with medical renal disease. 3. Benign simple cyst arising from the mid to lower pole of the right kidney as noted on the prior CT. 4. Small bilateral pleural effusions noted. Electronically Signed   By: Evangeline Dakin M.D.   On: 07/16/2015 10:53   Dg Chest Port 1 View  07/15/2015  CLINICAL DATA:  Pneumonia. EXAM: PORTABLE CHEST 1 VIEW COMPARISON:  07/12/2015 FINDINGS: The cardiomediastinal silhouette is unchanged allowing for patient rotation. Thoracic aortic calcification is noted. There is increasing, confluent airspace opacity in the left lung base. There is also new confluent airspace opacity in the right lung base. No large pleural effusion or pneumothorax is identified. IMPRESSION: New right and worsening left basilar airspace opacities concerning for pneumonia. Electronically Signed   By: Logan Bores M.D.   On: 07/15/2015 14:00   Dg Chest Port 1 View  07/12/2015  CLINICAL DATA:  Lethargy and low O2 sats for 1 day. EXAM: PORTABLE CHEST 1 VIEW COMPARISON:  Chest x-rays dated 07/10/2015 and 04/23/2015. Comparison also made to chest CT dated 04/23/2015. FINDINGS: Today's study is somewhat limited by patient positioning and motion artifact. There appears to be a dense opacity at the left lung base which is new, suspicious for developing pneumonia. Right lung remains grossly clear throughout. Cardiomediastinal silhouette is stable in size and configuration. IMPRESSION: New dense opacity at the left lung base which is highly suspicious for a developing pneumonia. Study limitations detailed above.  Electronically Signed   By: Franki Cabot M.D.   On: 07/12/2015 18:29    CBC  Recent Labs Lab 07/12/15 1700  07/13/15 0752 07/14/15 1018 07/15/15 0242 07/16/15 0222 07/17/15 0500  WBC 15.8*  --  22.8* 20.0* 16.8* 11.6* 11.0*  HGB 8.5*  < > 7.4* 8.0* 7.6* 7.6* 8.1*  HCT 26.0*  < > 22.5* 24.5* 23.1* 22.9* 23.8*  PLT 248  --  232 230  234 220 195  MCV 94.2  --  95.3 95.7 95.5 95.8 94.4  MCH 30.8  --  31.4 31.3 31.4 31.8 32.1  MCHC 32.7  --  32.9 32.7 32.9 33.2 34.0  RDW 13.9  --  14.1 14.3 14.4 14.5 14.2  LYMPHSABS 0.4*  --   --   --   --   --   --   MONOABS 0.8  --   --   --   --   --   --   EOSABS 0.0  --   --   --   --   --   --   BASOSABS 0.0  --   --   --   --   --   --   < > = values in this interval not displayed.  Chemistries   Recent Labs Lab 07/12/15 1700  07/13/15 0752 07/14/15 0352 07/15/15 0242 07/16/15 0222 07/17/15 0500  NA 143  < > 146* 150* 147* 143 138  K 4.1  < > 4.0 4.0 3.1* 3.7 3.4*  CL 110  < > 118* 121* 118* 116* 111  CO2 22  --  16* 16* 18* 19* 18*  GLUCOSE 147*  < > 108* 83 109* 95 90  BUN 51*  < > 45* 44* 38* 31* 25*  CREATININE 1.46*  < > 1.49* 1.45* 1.42* 1.49* 1.31*  CALCIUM 9.4  --  8.2* 8.7* 8.4* 8.4* 8.3*  AST 36  --   --   --   --   --   --   ALT 34  --   --   --   --   --   --   ALKPHOS 124  --   --   --   --   --   --   BILITOT 0.6  --   --   --   --   --   --   < > = values in this interval not displayed. ------------------------------------------------------------------------------------------------------------------ estimated creatinine clearance is 37.2 mL/min (by C-G formula based on Cr of 1.31). ------------------------------------------------------------------------------------------------------------------ No results for input(s): HGBA1C in the last 72 hours. ------------------------------------------------------------------------------------------------------------------ No results for input(s): CHOL, HDL, LDLCALC, TRIG,  CHOLHDL, LDLDIRECT in the last 72 hours. ------------------------------------------------------------------------------------------------------------------  Recent Labs  07/15/15 1408  TSH 1.459   ------------------------------------------------------------------------------------------------------------------  Recent Labs  07/15/15 1408  VITAMINB12 1757*    Coagulation profile No results for input(s): INR, PROTIME in the last 168 hours.  No results for input(s): DDIMER in the last 72 hours.  Cardiac Enzymes No results for input(s): CKMB, TROPONINI, MYOGLOBIN in the last 168 hours.  Invalid input(s): CK ------------------------------------------------------------------------------------------------------------------ Invalid input(s): POCBNP   Recent Labs  07/15/15 2006  GLUCAP 86     Hence Derrick M.D. [facility] 07/18/2015, 10:54 AM  Pager: [phone] Between 7am to 7pm - call Pager - [phone]  After 7pm go to [email or web address] - password [email or web address]  Call night coverage person covering after 7pm

## 2015-07-18 NOTE — Progress Notes (Signed)
Pt comfort care today - arouses a little when turned or oral care done. Given comfort meds throughout shift including ativan and Morphine IV & PO. Sister called this am - held the phone to pt's ear and she talked to him told him that she loved him- open his eyes a little.

## 2015-07-18 NOTE — Discharge Summary (Signed)
Physician Discharge Summary   Patient ID: Raymond Medina MRN: [id number] DOB/AGE: 1927-02-10 82 y.o.  Admit date: 07/12/2015 Discharge date: 07/18/2015  Primary Care Physician:  [name], MD  Discharge Diagnoses:   . HCAP (healthcare-associated pneumonia) . Sepsis ([place]) . Acute encephalopathy . Acute renal failure superimposed on stage 3 chronic kidney disease ([place]) . Essential hypertension . PERIPHERAL VASCULAR DISEASE . COPD (chronic obstructive pulmonary disease) ([place]) . GERD . CVA (cerebral infarction) . Anemia . Neuropathy of both feet ([place]) . LBP (low back pain) . Severe protein-calorie malnutrition ([place])   Consults: Palliative Medicine  Recommendations for Outpatient Follow-up:  1. DO NOT RESUSCITATE, comfort care   DIET: Comfort diet    Allergies:   Allergies  Allergen Reactions  . Budesonide-Formoterol Fumarate     REACTION: dizzy  . Hydrocodone-Acetaminophen     REACTION: prostate/ constipation  . Omeprazole     dizzy  . Plavix [Clopidogrel Bisulfate]     rash  . Ranitidine Hcl     REACTION: abd pain     DISCHARGE MEDICATIONS: Current Discharge Medication List    START taking these medications   Details  bisacodyl (DULCOLAX) 10 MG suppository Place 1 suppository (10 mg total) rectally daily as needed for moderate constipation. Qty: 12 suppository, Refills: 0    glycopyrrolate (ROBINUL) 1 MG tablet Take 1 tablet (1 mg total) by mouth every 4 (four) hours as needed (excessive secretions). Qty: 30 tablet, Refills: 0    LORazepam (ATIVAN) 1 MG tablet Take 1 tablet (1 mg total) by mouth every 4 (four) hours as needed for anxiety. Qty: 30 tablet, Refills: 0    Morphine Sulfate (MORPHINE CONCENTRATE) 10 MG/0.5ML SOLN concentrated solution Place 0.25 mLs (5 mg total) under the tongue every 2 (two) hours as needed for moderate pain (or dyspnea). Qty: 30 mL, Refills: 0    polyvinyl alcohol (LIQUIFILM TEARS) 1.4 % ophthalmic solution Place 1 drop  into both eyes 4 (four) times daily as needed for dry eyes. Qty: 15 mL, Refills: 0      CONTINUE these medications which have NOT CHANGED   Details  Incontinence Supply Disposable (DEPEND OVERNIGHT BRIEFS MEDIUM) MISC Use bid Qty: 100 each, Refills: 11      STOP taking these medications     amLODipine-valsartan (EXFORGE) 5-160 MG tablet      aspirin EC 81 MG tablet      b complex vitamins tablet      celecoxib (CELEBREX) 100 MG capsule      Cholecalciferol (VITAMIN D3) 1000 UNITS CAPS      dextromethorphan-guaiFENesin (MUCINEX DM) 30-600 MG 12hr tablet      feeding supplement, ENSURE ENLIVE, (ENSURE ENLIVE) LIQD      folic acid (FOLVITE) 1 MG tablet      gabapentin (NEURONTIN) 100 MG capsule      Omega-3 Fatty Acids (FISH OIL) 1000 MG CPDR      polyethylene glycol powder (GLYCOLAX/MIRALAX) powder      thiamine 100 MG tablet      traZODone (DESYREL) 50 MG tablet      triamcinolone cream (KENALOG) 0.5 %      ferrous sulfate 325 (65 FE) MG tablet      risperiDONE (RISPERDAL) 0.25 MG tablet          Brief H and P: For complete details please refer to admission H and P, but in brief 82 year old male with hypertension, COPD, GERD, PVD, chronic back pain, prostate CA, CVA, CKD stage III,  alcohol abuse presented with unresponsiveness and generalized weakness. Patient was found to have increased weakness, lethargy, decreased oral intake for several days at home by home health nurse, unresponsive on the day of admission. In ED, patient was hypotensive, hypothermic with temperature 4F, lactate 0.5, negative troponin, UA with positive UTI WBCs 15.8, worsening renal function. Chest x-Kar[place]et[place] showed left basilar infiltrates. Patient was admitted with sepsis secondary to HCAP, acute encephalopathy. On antibiotics with little improvement. MRI and CT negative for acute abnormalities. EEG was abnormal with slowing, finding nonspecific.   Hospital Course:   Sepsis due to HCAP -Was  hypothermic with leukocytosis upon admission - Chest x-Vil[place]as showed new right and worsening left basilar airspace opacities concerning for pneumonia - Swallow evaluation was done which showed aspiration and patient was placed on NPO status. Patient was continued on IV fluid hydration, IV vancomycin and Zosyn broad-spectrum antibiotics. - Strep pneumonia and legionella urine Ag were negative  - Blood cultures showed no growth to date - Influenza negative - Palliative care was consulted, placed on comfort care status after discussion with patient's family/sister POA. Overall poor prognosis and very short. Social work was consulted for residential hospice   Acute encephalopathy and unresponsiveness-Likely secondary to sepsis, likely has underlying dementia -CT head was negative for acute cranial abnormalities -MRI brain: chronic microvascular ischemia, stable remote lacunar infarcts of basal ganglia and thalami, moderate atrophy - EEG: abnormal with moderately severe generalized continuous slowing consistent with encephalopathy -Ammonia level 32 -TSH WNL, B12: 1757 - Comfort Care status   Essential hypertension -Blood pressures Borderline  Severe protein calorie malnutrition - currently NPO  History of CVA Currently comfort care status  Acute on chronic kidney disease, stage III : Likely secondary to sepsis -Baseline Cr <1, on admission 1.42, no further labs due to comfort care status  -Renal US : No evidence of hydronephrosis, consistent with medical renal disease  Iron deficiency anemia -Baseline hemoglobin 9-10, no further labs due to comfort care status  - Anemia panel Iron 26  History of alcohol abuse -Not on CIWA due to current state  Hypernatremia/Hyperchloremia Patient was placed on D5, sodium improved from 150 to 138. No further labs due to comfort care status.  Hypokalemia  -Replaced   Day of Discharge BP 97/69 mmHg  Pulse 73  Temp(Src) 98.7 F (37.1 C)  (Axillary)  Resp 21  Ht 5\' 7"  (1.702 m)  Wt 65 kg (143 lb 4.8 oz)  BMI 22.44 kg/m2  SpO2 88%  Physical Exam: General: Lethargic, NAD, comfortable HEENT: anicteric sclera, pupils reactive to light and accommodation CVS: S1-S2 clear no murmur rubs or gallops Chest: Decreased breath sounds Abdomen: soft nontender, nondistended, normal bowel sounds Extremities: no cyanosis, clubbing or edema noted bilaterally    The results of significant diagnostics from this hospitalization (including imaging, microbiology, ancillary and laboratory) are listed below for reference.    LAB RESULTS: Basic Metabolic Panel:  Recent Labs Lab 07/16/15 0222 07/17/15 0500  NA 143 138  K 3.7 3.4*  CL 116* 111  CO2 19* 18*  GLUCOSE 95 90  BUN 31* 25*  CREATININE 1.49* 1.31*  CALCIUM 8.4* 8.3*   Liver Function Tests:  Recent Labs Lab 07/12/15 1700  AST 36  ALT 34  ALKPHOS 124  BILITOT 0.6  PROT 6.0*  ALBUMIN 2.9*    Recent Labs Lab 07/12/15 1700  LIPASE 42    Recent Labs Lab 07/16/15 1019  AMMONIA 32   CBC:  Recent Labs Lab 07/12/15 1700  07/16/15  0222 07/17/15 0500  WBC 15.8*  < > 11.6* 11.0*  NEUTROABS 14.6*  --   --   --   HGB 8.5*  < > 7.6* 8.1*  HCT 26.0*  < > 22.9* 23.8*  MCV 94.2  < > 95.8 94.4  PLT 248  < > 220 195  < > = values in this interval not displayed. Cardiac Enzymes: No results for input(s): CKTOTAL, CKMB, CKMBINDEX, TROPONINI in the last 168 hours. BNP: Invalid input(s): POCBNP CBG:  Recent Labs Lab 07/14/15 1552 07/15/15 2006  GLUCAP 107* 86    Significant Diagnostic Studies:  Ct Head Wo Contrast  07/12/2015  CLINICAL DATA:  Lethargy/altered mental status. History of prostate carcinoma. EXAM: CT HEAD WITHOUT CONTRAST TECHNIQUE: Contiguous axial images were obtained from the base of the skull through the vertex without intravenous contrast. COMPARISON:  April 23, 2015 FINDINGS: There is mild diffuse atrophy, stable. There is no  intracranial mass, hemorrhage, extra-axial fluid collection, or midline shift there is patchy small vessel disease in the centra semiovale bilaterally. There are prior small lacunar infarcts in the superior medial left basal ganglia. There is small vessel disease in each thalamus. There is also small vessel disease throughout much of the right external capsule, stable. No acute appearing infarct is evident. Bony calvarium appears intact. The mastoid air cells are clear. There are air-fluid levels in each maxillary antrum. No intraorbital lesions are apparent. IMPRESSION: Atrophy with small vessel disease and prior lacunar type infarcts as noted above. There is no intracranial mass, hemorrhage, or evidence of acute infarct. There are air-fluid levels in each maxillary antrum consistent with acute sinusitis bilaterally. Electronically Signed   By: Lowella Grip III M.D.   On: 07/12/2015 17:41   Dg Chest Port 1 View  07/12/2015  CLINICAL DATA:  Lethargy and low O2 sats for 1 day. EXAM: PORTABLE CHEST 1 VIEW COMPARISON:  Chest x-rays dated 07/10/2015 and 04/23/2015. Comparison also made to chest CT dated 04/23/2015. FINDINGS: Today's study is somewhat limited by patient positioning and motion artifact. There appears to be a dense opacity at the left lung base which is new, suspicious for developing pneumonia. Right lung remains grossly clear throughout. Cardiomediastinal silhouette is stable in size and configuration. IMPRESSION: New dense opacity at the left lung base which is highly suspicious for a developing pneumonia. Study limitations detailed above. Electronically Signed   By: Franki Cabot M.D.   On: 07/12/2015 18:29     Disposition and Follow-up:    DISPOSITION: Residential hospice   DISCHARGE FOLLOW-UP Follow-up Information    Schedule an appointment as soon as possible for a visit with [name], MD.   Specialty:  Internal Medicine   Why:  As needed, If symptoms worsen   Contact  information:   [address] [place] [id number] [phone]        Time spent on Discharge: 25 minutes  Signed:   Nzinga Ferran M.D. [facility] 07/18/2015, 2:22 PM Pager: [phone]

## 2015-07-18 NOTE — Progress Notes (Signed)
Pt transferred per [facility] to [facility] - pt has no personal belongings. Chronic foley catheter in place

## 2015-07-18 NOTE — Progress Notes (Signed)
Called report  [facility] to [facility] one of the charge nurses there.     Social worker has notified family of transfer

## 2015-07-18 NOTE — Progress Notes (Signed)
Palliative Medicine RN: Pt is resting with open-mouth breathing and short, gasping breaths. No s/s distress. RN reports pt has rec'd several doses of morphine overnight and today, and they are effective. She states pt's sister/POA called; RN held phone to pt's ear so she could say good bye. Per SW still waiting on hospice bed; orders have been placed to move pt to regular room. Plan f/u tomorrow by PM RN.  Larina Earthly, RN, BSN, [facility] 07/18/2015 11:28 AM Cell [phone] 8:00-4:00 Monday-Friday Office [phone]

## 2015-07-18 NOTE — Progress Notes (Signed)
CSW continues to follow for hospice placement.  [facility] do not anticipate bed availability today but [facility] and [facility] are evaluating and have availability today if pt is appropriate for DC.  CSW will continue to follow  Domenica Reamer, [place] Social Worker [phone]

## 2015-07-19 ENCOUNTER — Telehealth: Payer: Self-pay | Admitting: *Deleted

## 2015-07-19 NOTE — Telephone Encounter (Signed)
Tried calling pt no answer x's 10 rigs...Raymond Medina

## 2015-07-19 NOTE — Telephone Encounter (Signed)
Pt was on TCM list admitted for HCAP, Sepsis. Pt will be d/c to residential hospice and was told to f/u w/MD ASAP...Johny Chess

## 2015-07-22 NOTE — Telephone Encounter (Signed)
Tried calling pt again stikll no answer, but per chart pt was made hosp f/u 07/24/15 w/ dr. Alain Marion....Raymond Medina

## 2015-07-23 ENCOUNTER — Ambulatory Visit: Payer: Medicare Other | Admitting: Internal Medicine

## 2015-07-24 ENCOUNTER — Inpatient Hospital Stay: Payer: Medicare Other | Admitting: Internal Medicine

## 2015-07-28 ENCOUNTER — Encounter: Payer: Self-pay | Admitting: Adult Health

## 2015-07-28 NOTE — Progress Notes (Signed)
Patient ID: Raymond Medina, male   DOB: 11-28-1926, 82 y.o.   MRN: [id number]   Facility: [facility]       Allergies  Allergen Reactions  . Budesonide-Formoterol Fumarate     REACTION: dizzy  . Hydrocodone-Acetaminophen     REACTION: prostate/ constipation  . Omeprazole     dizzy  . Plavix [Clopidogrel Bisulfate]     rash  . Ranitidine Hcl     REACTION: abd pain    Chief Complaint  Patient presents with  . Medical Management of Chronic Issues    HPI:  He is a resident of this facility being seen for the management of his chronic illnesses. His weight is presently stable at 129 pounds. He is not voicing any complaints or concerns at this time. There are no nursing concerns at this time.    Past Medical History  Diagnosis Date  . Hypertension   . Low back pain   . Osteoarthritis   . Peripheral vascular disease ([place])   . Gout   . Giant cell arteritis ([place])   . COPD (chronic obstructive pulmonary disease) ([facility])   . Allergic rhinitis   . GERD (gastroesophageal reflux disease)   . Tubulovillous adenoma polyp of colon     08/2001  . Hemorrhoids   . Radiation proctitis   . BPH (benign prostatic hyperplasia)   . Diverticulosis   . INSOMNIA, CHRONIC 06/14/2009  . HYPERTENSION 08/24/2007  . PERIPHERAL VASCULAR DISEASE 08/24/2007  . BRONCHITIS, ACUTE 08/03/2008  . ALLERGIC RHINITIS 09/05/2008  . COPD 08/08/2008  . GERD 12/07/2008  . CONSTIPATION 10/31/2009  . Actinic keratosis 06/04/2008  . OSTEOARTHRITIS 08/24/2007  . NECK PAIN 08/24/2007  . LOW BACK PAIN 08/24/2007  . WEIGHT LOSS 09/17/2009  . IMPAIRED GLUCOSE TOLERANCE 08/03/2008  . Contusion of forearm 09/17/2008  . 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 TOBACCO USE, QUIT 03/14/2009  . PROSTATE CANCER, HX OF 2000  . AVM (arteriovenous malformation)   . CVA (cerebral infarction) 09-09-10  . Prostate cancer ([place])   . CKD (chronic kidney disease), stage III     Past Surgical History  Procedure Laterality Date  . Insertion  prostate radiation seed      2000  . Cataract extraction, bilateral      VITAL SIGNS BP 122/58 mmHg  Pulse 96  Ht 5\' 8"  (1.727 m)  Wt 129 lb (58.514 kg)  BMI 19.62 kg/m2  Patient's Medications  New Prescriptions   No medications on file  Previous Medications   Asa 81 mg  Take 81 mg daily    Vitamin b complex Take daily    celebrex 100 mg  Take 100 mg twice daily    exforge 5-160 mg  Take daily    Fish oil 1 gm  Take 1 gm daily    miralax 17 daily as needed   mucinex dm  Take twice daily as needed   neurontin 100 mg  Take 100 mg nightly   Thiamine 100 mg  Take one daily    Trazodone 50 mg  Take 50 mg nightly as needed   Vitamin D  Take one daily   Modified Medications   No medications on file  Discontinued Medications   No medications on file     SIGNIFICANT DIAGNOSTIC EXAMS   04-23-15: chest x-[place]: No active disease.  04-23-15: left elbow x-[place]ekind: Soft tissue edema with mild generalized osteoarthritic change. No fracture or dislocation apparent.   04-23-15: ct of head and cervical  spine: 1. No acute intracranial abnormality. 2. Chronic microvascular disease and brain atrophy. 3. No evidence for cervical spine fracture. 4. Carotid artery atherosclerotic calcifications.   04-23-15; ct of chest; abdomen and pelvis: CT of the chest: Chronic changes within the chest to include calcified granulomas,calcified pleural plaques and emphysematous changes. No acute abnormality is noted. CT of the abdomen and pelvis:  Right renal cyst. Findings of prior granulomatous disease. Diverticulosis without diverticulitis  04-24-15: 2-d echo: - Left ventricle: The cavity size was normal. Wall thickness was increased in a pattern of mild LVH. Systolic function was normal. The estimated ejection fraction was in the range of 55% to 60%. - Aortic valve: Mechanism of AR not apparent There was moderate regurgitation. - Atrial septum: No defect or patent foramen ovale was identified.   LABS  REVIEWED:   04-23-15: wbc 11.9; hgb 10.7; hct 31.1; mcv 100.0; plt 187; glucose 106; bun 50; creat 2.05; k+ 3.8; na++146; liver normal albumin 4.0; CK 821; tsh 0.397; hgb a1c 5.4; vit B12: 1064; folate 65.1; iron 20; TIBC 216; ferritin 532;  04-26-15; glucose 100; bun 19; creat 0.90; k+ 3.9; na++141; phos 3.6  11-28-116: wbc 9.7; hgb 10.0; hct 28.5; mcv 97.9; plt 207; glucose 83; bun 23; creat 0.86; k+ 3.9; na++141; phos 2.8     Review of Systems  Constitutional: Negative for malaise/fatigue.  Respiratory: Negative for cough and shortness of breath.   Cardiovascular: Negative for chest pain, palpitations and leg swelling.  Gastrointestinal: Negative for heartburn, abdominal pain and constipation.  Musculoskeletal: Negative for myalgias, back pain and joint pain.  Skin: Negative.   Neurological: Negative for dizziness.  Psychiatric/Behavioral: The patient is not nervous/anxious.       Physical Exam  Constitutional: No distress.  Frail   Eyes: Conjunctivae are normal.  Neck: Neck supple. No JVD present. No thyromegaly present.  Cardiovascular: Normal rate, regular rhythm and intact distal pulses.   Respiratory: Effort normal and breath sounds normal. No respiratory distress. He has no wheezes.  GI: Soft. Bowel sounds are normal. He exhibits no distension. There is no tenderness.  Musculoskeletal: He exhibits no edema.  Able to move all extremities   Lymphadenopathy:    He has no cervical adenopathy.  Neurological: He is alert.  Skin: Skin is warm and dry. He is not diaphoretic.  Psychiatric: He has a normal mood and affect.        ASSESSMENT/ PLAN:  1. Hypertension: will continue exofrge 5/160 mg daily   2. Constipation: will continue miralax daily as needed  3. CVA: is neurologically without change will continue asa 81 mg daily   4. Bursitis; left elbow: no swelling present; will continue celebrex 100 mg twice  daily   5. Alcoholic dementia: no significant change in  his status; will continue vitamin supplement and will monitor his status.   6. Neuropathy in feet: will continue neurontin 100 mg nightly   7. Severe protein calorie malnutrition: will continue supplements per facility protocol      Ok Edwards NP [facility]  Contact [phone] Monday through Friday 8am- 5pm  After hours call [phone]

## 2015-07-31 DEATH — deceased

## 2015-08-12 NOTE — Progress Notes (Signed)
Patient ID: Raymond Medina, male   DOB: 03-Jan-1927, 82 y.o.   MRN: [id number]   Facility: [facility]       Allergies  Allergen Reactions  . Budesonide-Formoterol Fumarate     REACTION: dizzy  . Hydrocodone-Acetaminophen     REACTION: prostate/ constipation  . Omeprazole     dizzy  . Plavix [Clopidogrel Bisulfate]     rash  . Ranitidine Hcl     REACTION: abd pain    Chief Complaint  Patient presents with  . Discharge Note    HPI:  He is being discharged to home with home health for pt. He will need a standard wheelchair. He will need to follow up with his pcp. He will need his prescriptions to written. He had been hospitalized after falls at home. He was admitted to this facility for short term rehab.    Past Medical History  Diagnosis Date  . Hypertension   . Low back pain   . Osteoarthritis   . Peripheral vascular disease ([place])   . Gout   . Giant cell arteritis ([place])   . COPD (chronic obstructive pulmonary disease) ([place])   . Allergic rhinitis   . GERD (gastroesophageal reflux disease)   . Tubulovillous adenoma polyp of colon     08/2001  . Hemorrhoids   . Radiation proctitis   . BPH (benign prostatic hyperplasia)   . Diverticulosis   . INSOMNIA, CHRONIC 06/14/2009  . HYPERTENSION 08/24/2007  . PERIPHERAL VASCULAR DISEASE 08/24/2007  . BRONCHITIS, ACUTE 08/03/2008  . ALLERGIC RHINITIS 09/05/2008  . COPD 08/08/2008  . GERD 12/07/2008  . CONSTIPATION 10/31/2009  . Actinic keratosis 06/04/2008  . OSTEOARTHRITIS 08/24/2007  . NECK PAIN 08/24/2007  . LOW BACK PAIN 08/24/2007  . WEIGHT LOSS 09/17/2009  . IMPAIRED GLUCOSE TOLERANCE 08/03/2008  . Contusion of forearm 09/17/2008  . 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 TOBACCO USE, QUIT 03/14/2009  . PROSTATE CANCER, HX OF 2000  . AVM (arteriovenous malformation)   . CVA (cerebral infarction) 09-09-10  . Prostate cancer ([place])   . CKD (chronic kidney disease), stage III     Past Surgical History  Procedure Laterality Date    . Insertion prostate radiation seed      2000  . Cataract extraction, bilateral      VITAL SIGNS BP 138/72 mmHg  Pulse 85  Ht 5\' 8"  (1.727 m)  Wt 129 lb 6.4 oz (58.695 kg)  BMI 19.68 kg/m2  SpO2 97%  Patient's Medications  New Prescriptions   No medications on file  Previous Medications   Asa 81 mg  Take 81 mg daily    Vitamin b complex Take daily    celebrex 100 mg  Take 100 mg twice daily    exforge 5-160 mg  Take daily    Fish oil 1 gm  Take 1 gm daily    miralax 17 daily as needed   mucinex dm  Take twice daily as needed   neurontin 100 mg  Take 100 mg nightly   Thiamine 100 mg  Take one daily    Trazodone 50 mg  Take 50 mg nightly as needed   Vitamin D  Take one daily     Modified Medications   No medications on file  Discontinued Medications   No medications on file     SIGNIFICANT DIAGNOSTIC EXAMS   04-23-15: chest x-[place]: No active disease.  04-23-15: left elbow x-[place]: Soft tissue edema with mild generalized osteoarthritic change.  No fracture or dislocation apparent.   04-23-15: ct of head and cervical spine: 1. No acute intracranial abnormality. 2. Chronic microvascular disease and brain atrophy. 3. No evidence for cervical spine fracture. 4. Carotid artery atherosclerotic calcifications.   04-23-15; ct of chest; abdomen and pelvis: CT of the chest: Chronic changes within the chest to include calcified granulomas,calcified pleural plaques and emphysematous changes. No acute abnormality is noted. CT of the abdomen and pelvis:  Right renal cyst. Findings of prior granulomatous disease. Diverticulosis without diverticulitis  04-24-15: 2-d echo: - Left ventricle: The cavity size was normal. Wall thickness was increased in a pattern of mild LVH. Systolic function was normal. The estimated ejection fraction was in the range of 55% to 60%. - Aortic valve: Mechanism of AR not apparent There was moderate regurgitation. - Atrial septum: No defect or patent  foramen ovale was identified.   LABS REVIEWED:   04-23-15: wbc 11.9; hgb 10.7; hct 31.1; mcv 100.0; plt 187; glucose 106; bun 50; creat 2.05; k+ 3.8; na++146; liver normal albumin 4.0; CK 821; tsh 0.397; hgb a1c 5.4; vit B12: 1064; folate 65.1; iron 20; TIBC 216; ferritin 532;  04-26-15; glucose 100; bun 19; creat 0.90; k+ 3.9; na++141; phos 3.6  11-28-116: wbc 9.7; hgb 10.0; hct 28.5; mcv 97.9; plt 207; glucose 83; bun 23; creat 0.86; k+ 3.9; na++141; phos 2.8     Review of Systems  Constitutional: Negative for malaise/fatigue.  Respiratory: Negative for cough and shortness of breath.   Cardiovascular: Negative for chest pain, palpitations and leg swelling.  Gastrointestinal: Negative for heartburn, abdominal pain and constipation.  Musculoskeletal: Negative for myalgias, back pain and joint pain.  Skin: Negative.   Neurological: Negative for dizziness.  Psychiatric/Behavioral: The patient is not nervous/anxious.       Physical Exam  Constitutional: No distress.  Frail   Eyes: Conjunctivae are normal.  Neck: Neck supple. No JVD present. No thyromegaly present.  Cardiovascular: Normal rate, regular rhythm and intact distal pulses.   Respiratory: Effort normal and breath sounds normal. No respiratory distress. He has no wheezes.  GI: Soft. Bowel sounds are normal. He exhibits no distension. There is no tenderness.  Musculoskeletal: He exhibits no edema.  Able to move all extremities   Lymphadenopathy:    He has no cervical adenopathy.  Neurological: He is alert.  Skin: Skin is warm and dry. He is not diaphoretic.  Psychiatric: He has a normal mood and affect.      ASSESSMENT/ PLAN:  Will discharge him to home with home health for pt to evaluate and treat as indicated for gait balance. He requires the use of a standard wheelchair with antitips in order to allow him to maintain his current level of independence with his adl's which cannot be achieved with a walker; he can  self propel. His prescriptions have been written for a 30 day supply of his medications. The facility will setup a  Follow up appointment in the [place].    Time spent with patient   45  minutes >50% time spent counseling; reviewing medical record; tests; labs; and developing future plan of care   Ok Edwards NP [facility]  Contact [phone] Monday through Friday 8am- 5pm  After hours call [phone]

## 2015-08-12 NOTE — Progress Notes (Signed)
Patient ID: Raymond Medina, male   DOB: 24-Dec-1926, 82 y.o.   MRN: [id number]   Facility: [facility]       Allergies  Allergen Reactions  . Budesonide-Formoterol Fumarate     REACTION: dizzy  . Hydrocodone-Acetaminophen     REACTION: prostate/ constipation  . Omeprazole     dizzy  . Plavix [Clopidogrel Bisulfate]     rash  . Ranitidine Hcl     REACTION: abd pain    Chief Complaint  Patient presents with  . Discharge Note    HPI:  He was to be discharged earlier; however; those plan had fallen through. He is being discharged at this time to home with home health for pt/ot/st. He will need a standard wheelchair with anti-tips. He will need his prescriptions to be written and will need to follow up with his pcp.    Past Medical History  Diagnosis Date  . Hypertension   . Low back pain   . Osteoarthritis   . Peripheral vascular disease ([place])   . Gout   . Giant cell arteritis ([place])   . COPD (chronic obstructive pulmonary disease) ([place])   . Allergic rhinitis   . GERD (gastroesophageal reflux disease)   . Tubulovillous adenoma polyp of colon     08/2001  . Hemorrhoids   . Radiation proctitis   . BPH (benign prostatic hyperplasia)   . Diverticulosis   . INSOMNIA, CHRONIC 06/14/2009  . HYPERTENSION 08/24/2007  . PERIPHERAL VASCULAR DISEASE 08/24/2007  . BRONCHITIS, ACUTE 08/03/2008  . ALLERGIC RHINITIS 09/05/2008  . COPD 08/08/2008  . GERD 12/07/2008  . CONSTIPATION 10/31/2009  . Actinic keratosis 06/04/2008  . OSTEOARTHRITIS 08/24/2007  . NECK PAIN 08/24/2007  . LOW BACK PAIN 08/24/2007  . WEIGHT LOSS 09/17/2009  . IMPAIRED GLUCOSE TOLERANCE 08/03/2008  . Contusion of forearm 09/17/2008  . 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 TOBACCO USE, QUIT 03/14/2009  . PROSTATE CANCER, HX OF 2000  . AVM (arteriovenous malformation)   . CVA (cerebral infarction) 09-09-10  . Prostate cancer ([place])   . CKD (chronic kidney disease), stage III     Past Surgical History  Procedure  Laterality Date  . Insertion prostate radiation seed      2000  . Cataract extraction, bilateral      VITAL SIGNS BP 117/56 mmHg  Pulse 68  Ht 5\' 8"  (1.727 m)  Wt 131 lb (59.421 kg)  BMI 19.92 kg/m2  SpO2 93%  Patient's Medications  New Prescriptions   No medications on file  Previous Medications   Asa 81 mg  Take 81 mg daily    Vitamin b complex Take daily    celebrex 100 mg  Take 100 mg twice daily    exforge 5-160 mg  Take daily    Fish oil 1 gm  Take 1 gm daily    miralax 17 daily as needed   mucinex dm  Take twice daily as needed   neurontin 100 mg  Take 100 mg nightly   Thiamine 100 mg  Take one daily    Trazodone 50 mg  Take 50 mg nightly as needed   Vitamin D  Take one daily     Modified Medications   No medications on file  Discontinued Medications   No medications on file     SIGNIFICANT DIAGNOSTIC EXAMS    04-23-15: chest x-Kendra: No active disease.  04-23-15: left elbow x-[place]: Soft tissue edema with mild generalized osteoarthritic change. No fracture  or dislocation apparent.   04-23-15: ct of head and cervical spine: 1. No acute intracranial abnormality. 2. Chronic microvascular disease and brain atrophy. 3. No evidence for cervical spine fracture. 4. Carotid artery atherosclerotic calcifications.   04-23-15; ct of chest; abdomen and pelvis: CT of the chest: Chronic changes within the chest to include calcified granulomas,calcified pleural plaques and emphysematous changes. No acute abnormality is noted. CT of the abdomen and pelvis:  Right renal cyst. Findings of prior granulomatous disease. Diverticulosis without diverticulitis  04-24-15: 2-d echo: - Left ventricle: The cavity size was normal. Wall thickness was increased in a pattern of mild LVH. Systolic function was normal. The estimated ejection fraction was in the range of 55% to 60%. - Aortic valve: Mechanism of AR not apparent There was moderate regurgitation. - Atrial septum: No defect or  patent foramen ovale was identified.   LABS REVIEWED:   04-23-15: wbc 11.9; hgb 10.7; hct 31.1; mcv 100.0; plt 187; glucose 106; bun 50; creat 2.05; k+ 3.8; na++146; liver normal albumin 4.0; CK 821; tsh 0.397; hgb a1c 5.4; vit B12: 1064; folate 65.1; iron 20; TIBC 216; ferritin 532;  04-26-15; glucose 100; bun 19; creat 0.90; k+ 3.9; na++141; phos 3.6  11-28-116: wbc 9.7; hgb 10.0; hct 28.5; mcv 97.9; plt 207; glucose 83; bun 23; creat 0.86; k+ 3.9; na++141; phos 2.8     Review of Systems  Constitutional: Negative for malaise/fatigue.  Respiratory: Negative for cough and shortness of breath.   Cardiovascular: Negative for chest pain, palpitations and leg swelling.  Gastrointestinal: Negative for heartburn, abdominal pain and constipation.  Musculoskeletal: Negative for myalgias, back pain and joint pain.  Skin: Negative.   Neurological: Negative for dizziness.  Psychiatric/Behavioral: The patient is not nervous/anxious.       Physical Exam  Constitutional: No distress.  Frail   Eyes: Conjunctivae are normal.  Neck: Neck supple. No JVD present. No thyromegaly present.  Cardiovascular: Normal rate, regular rhythm and intact distal pulses.   Respiratory: Effort normal and breath sounds normal. No respiratory distress. He has no wheezes.  GI: Soft. Bowel sounds are normal. He exhibits no distension. There is no tenderness.  Musculoskeletal: He exhibits no edema.  Able to move all extremities   Lymphadenopathy:    He has no cervical adenopathy.  Neurological: He is alert.  Skin: Skin is warm and dry. He is not diaphoretic.  Psychiatric: He has a normal mood and affect.      ASSESSMENT/ PLAN:  Will discharge him to home with home health for pt/ot/stto evaluate and treat as indicated for gait balance strength adl retraining and cognition. He requires the use of a standard wheelchair with antitips in order to allow him to maintain his current level of independence with his adl's  which cannot be achieved with a walker; he can self propel. His prescriptions have been written for a 30 day supply of his medications. He has a follow up with DR. Plotnikov on 07-23-15.    Time spent with patient   45  minutes >50% time spent counseling; reviewing medical record; tests; labs; and developing future plan of care        Ok Edwards NP [facility]  Contact [phone] Monday through Friday 8am- 5pm  After hours call [phone]

## 2015-12-05 DIAGNOSIS — Z515 Encounter for palliative care: Secondary | ICD-10-CM | POA: Insufficient documentation

## 2016-03-07 IMAGING — CT CT CHEST W/O CM
2 of 5 series · 9 of 36 positions shown, 11 images · non-contrast
Comparison: None.

CLINICAL DATA: Recent fall with left-sided body pain, initial
encounter

EXAM:
CT CHEST, ABDOMEN AND PELVIS WITHOUT CONTRAST
TECHNIQUE: Multidetector CT imaging of the chest, abdomen and pelvis was
performed following the standard protocol without IV contrast.

[Series 2: cap wo 5.0 mpr · axial · 0.49mm/px · z∈[+986,+1486]mm · 6 of 132 slices shown, 8 images]
[im 16/132  mediastinal]
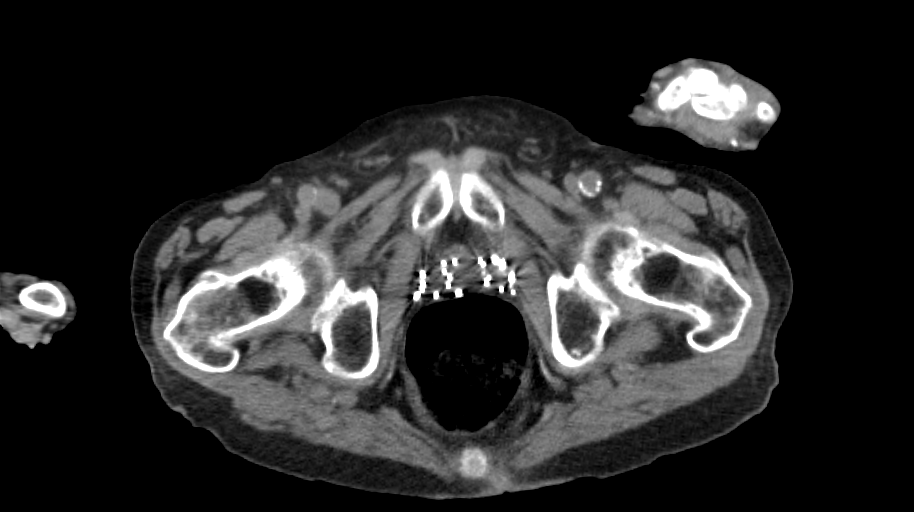
[im 16/132  lung]
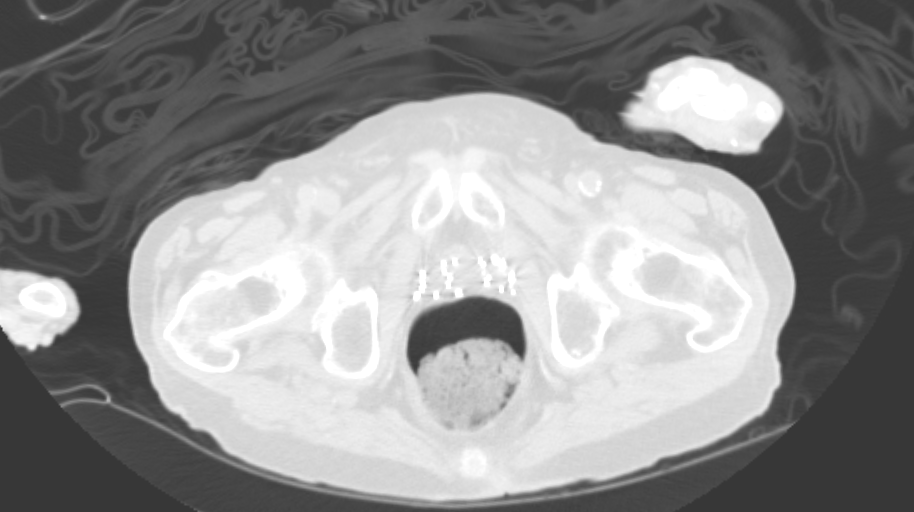
[im 39/132  lung]
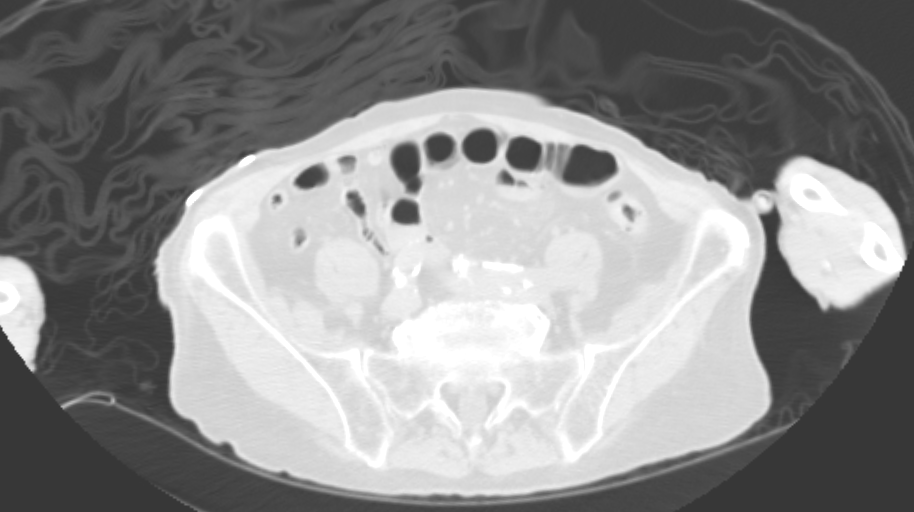
[im 54/132  lung]
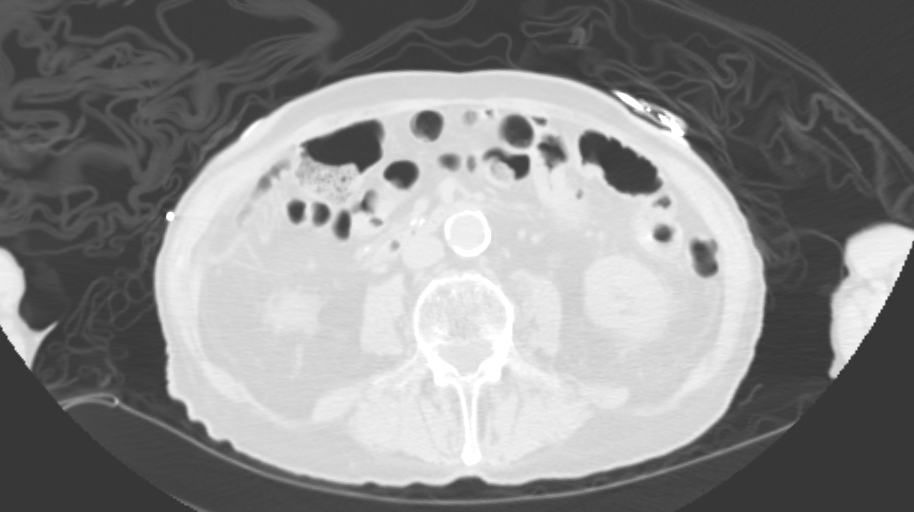
[im 78/132  lung]
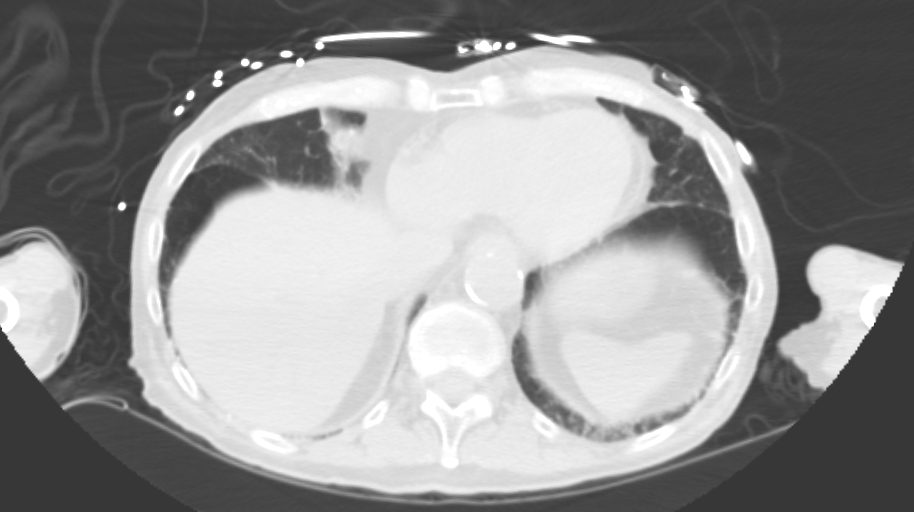
[im 101/132  mediastinal]
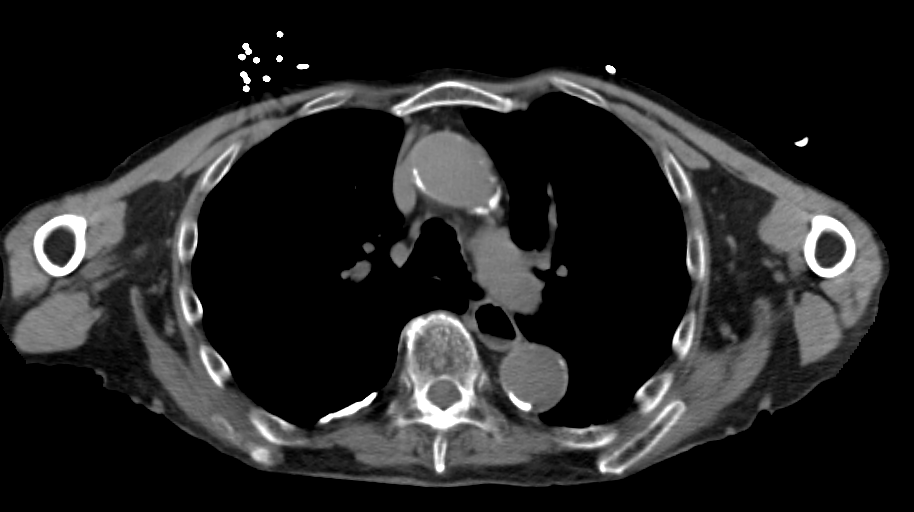
[im 101/132  lung]
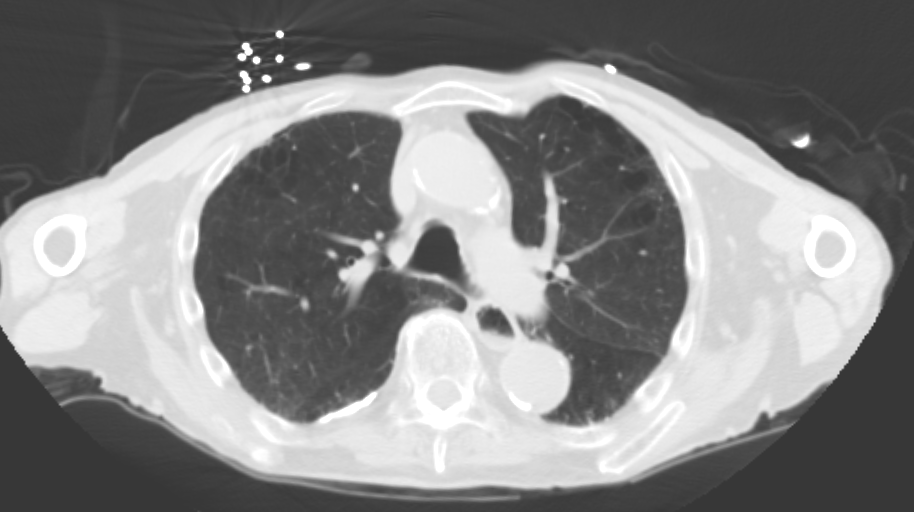
[im 116/132  lung]
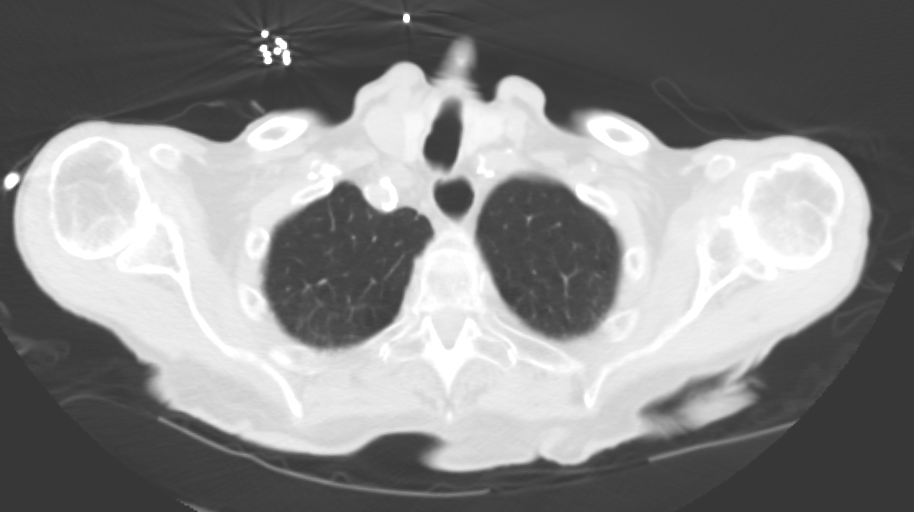

[Series 4: coronal · coronal · 0.75mm/px · 3 of 87 slices shown]
[im 18/87  lung]
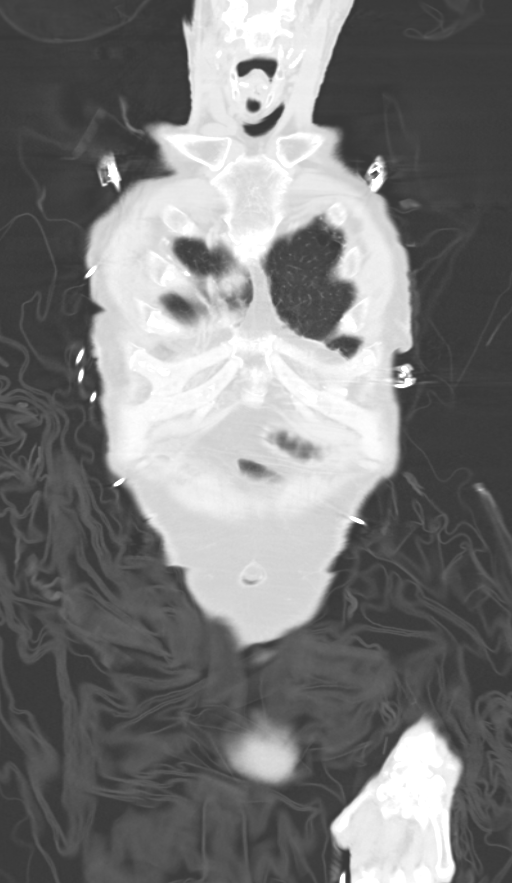
[im 35/87  lung]
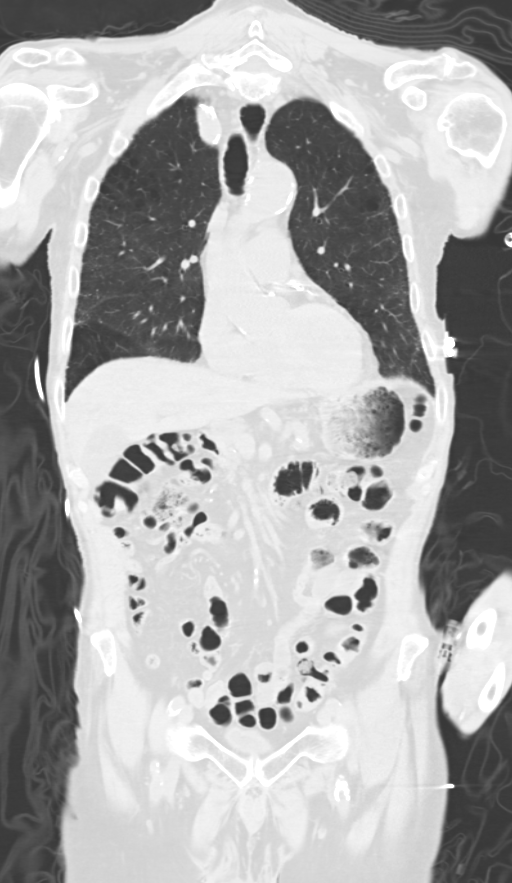
[im 52/87  lung]
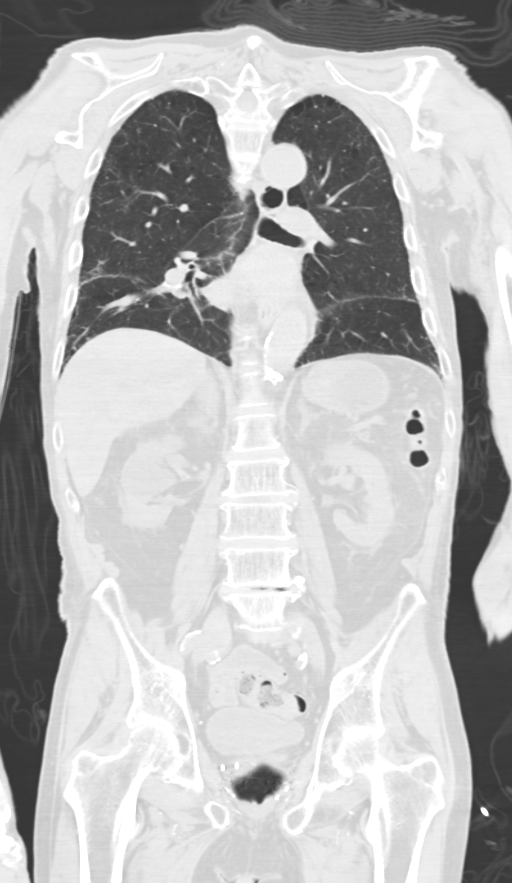

[9 of 36 positions shown; findings below may reference images not displayed]

FINDINGS: CT CHEST FINDINGS

Mediastinum/Lymph Nodes: Diffuse calcification of the thoracic aorta
in its branches are noted. Coronary calcifications are seen as well.
Multiple calcified right hilar lymph nodes are noted consistent with
prior granulomatous disease. No acute abnormality is noted.

Lungs/Pleura: Well aerated without evidence of focal infiltrate or
sizable effusion. Diffuse emphysematous changes are noted.
Additionally a few calcified granulomas are seen. Multiple calcified
pleural plaques are seen.

Musculoskeletal: Degenerative changes of the thoracic spine are
noted. No acute abnormality is seen.

CT ABDOMEN PELVIS FINDINGS

Hepatobiliary: Within normal limits.

Pancreas: Within normal limits.

Spleen: Multiple calcified granulomas are seen.

Adrenals/Urinary Tract: No renal calculi or obstructive changes are
noted. A large right renal cyst is noted which measures
approximately 4.5 cm in greatest dimension. The adrenal glands are
within normal limits.

Stomach/Bowel: The appendix is not well visualized although no
inflammatory changes are seen to suggest appendicitis. Scattered
diverticular change is noted without evidence of diverticulitis. No
obstructive changes are seen.

Vascular/Lymphatic: Diffuse aortic calcifications are seen. Mild
ectasia to 28 mm is noted in the infrarenal aorta. No significant
lymphadenopathy is identified.

Reproductive: Prostate brachytherapy seeds are noted.

Other: Bladder is well distended.  No pelvic mass lesion is seen.

Musculoskeletal: Degenerative changes of the lumbar spine are noted.
No acute bony abnormality is seen.
IMPRESSION: CT of the chest: Chronic changes within the chest to include
calcified granulomas,calcified pleural plaques and emphysematous
changes. No acute abnormality is noted.

CT of the abdomen and pelvis:  Right renal cyst.

Findings of prior granulomatous disease.

Diverticulosis without diverticulitis.
